# Patient Record
Sex: Male | Born: 1955
Health system: Southern US, Community
[De-identification: ages and names within clinical notes are randomized; demographics above are authoritative.]

## PROBLEM LIST (undated history)

## (undated) DIAGNOSIS — R7989 Other specified abnormal findings of blood chemistry: Secondary | ICD-10-CM

## (undated) DIAGNOSIS — J449 Chronic obstructive pulmonary disease, unspecified: Secondary | ICD-10-CM

## (undated) DIAGNOSIS — H269 Unspecified cataract: Secondary | ICD-10-CM

## (undated) DIAGNOSIS — K648 Other hemorrhoids: Secondary | ICD-10-CM

## (undated) DIAGNOSIS — G4730 Sleep apnea, unspecified: Secondary | ICD-10-CM

## (undated) DIAGNOSIS — F17201 Nicotine dependence, unspecified, in remission: Secondary | ICD-10-CM

## (undated) DIAGNOSIS — R945 Abnormal results of liver function studies: Secondary | ICD-10-CM

## (undated) DIAGNOSIS — K08109 Complete loss of teeth, unspecified cause, unspecified class: Secondary | ICD-10-CM

## (undated) DIAGNOSIS — I2510 Atherosclerotic heart disease of native coronary artery without angina pectoris: Secondary | ICD-10-CM

## (undated) DIAGNOSIS — K219 Gastro-esophageal reflux disease without esophagitis: Secondary | ICD-10-CM

## (undated) DIAGNOSIS — J189 Pneumonia, unspecified organism: Secondary | ICD-10-CM

## (undated) DIAGNOSIS — N189 Chronic kidney disease, unspecified: Secondary | ICD-10-CM

## (undated) DIAGNOSIS — I10 Essential (primary) hypertension: Secondary | ICD-10-CM

## (undated) DIAGNOSIS — F419 Anxiety disorder, unspecified: Secondary | ICD-10-CM

## (undated) DIAGNOSIS — T7840XA Allergy, unspecified, initial encounter: Secondary | ICD-10-CM

## (undated) DIAGNOSIS — K760 Fatty (change of) liver, not elsewhere classified: Secondary | ICD-10-CM

## (undated) DIAGNOSIS — G629 Polyneuropathy, unspecified: Secondary | ICD-10-CM

## (undated) DIAGNOSIS — E785 Hyperlipidemia, unspecified: Secondary | ICD-10-CM

## (undated) DIAGNOSIS — Z972 Presence of dental prosthetic device (complete) (partial): Secondary | ICD-10-CM

## (undated) DIAGNOSIS — G709 Myoneural disorder, unspecified: Secondary | ICD-10-CM

## (undated) DIAGNOSIS — N529 Male erectile dysfunction, unspecified: Secondary | ICD-10-CM

## (undated) DIAGNOSIS — D649 Anemia, unspecified: Secondary | ICD-10-CM

## (undated) DIAGNOSIS — I219 Acute myocardial infarction, unspecified: Secondary | ICD-10-CM

## (undated) DIAGNOSIS — D126 Benign neoplasm of colon, unspecified: Secondary | ICD-10-CM

## (undated) HISTORY — DX: Pneumonia, unspecified organism: J18.9

## (undated) HISTORY — DX: Chronic kidney disease, unspecified: N18.9

## (undated) HISTORY — PX: COLONOSCOPY: SHX174

## (undated) HISTORY — DX: Nicotine dependence, unspecified, in remission: F17.201

## (undated) HISTORY — DX: Essential (primary) hypertension: I10

## (undated) HISTORY — DX: Atherosclerotic heart disease of native coronary artery without angina pectoris: I25.10

## (undated) HISTORY — DX: Chronic obstructive pulmonary disease, unspecified: J44.9

## (undated) HISTORY — PX: POLYPECTOMY: SHX149

## (undated) HISTORY — DX: Male erectile dysfunction, unspecified: N52.9

## (undated) HISTORY — DX: Anemia, unspecified: D64.9

## (undated) HISTORY — DX: Abnormal results of liver function studies: R94.5

## (undated) HISTORY — DX: Sleep apnea, unspecified: G47.30

## (undated) HISTORY — DX: Hyperlipidemia, unspecified: E78.5

## (undated) HISTORY — DX: Gastro-esophageal reflux disease without esophagitis: K21.9

## (undated) HISTORY — DX: Anxiety disorder, unspecified: F41.9

## (undated) HISTORY — DX: Fatty (change of) liver, not elsewhere classified: K76.0

## (undated) HISTORY — DX: Acute myocardial infarction, unspecified: I21.9

## (undated) HISTORY — PX: UPPER GASTROINTESTINAL ENDOSCOPY: SHX188

## (undated) HISTORY — DX: Benign neoplasm of colon, unspecified: D12.6

## (undated) HISTORY — DX: Other hemorrhoids: K64.8

## (undated) HISTORY — DX: Myoneural disorder, unspecified: G70.9

## (undated) HISTORY — DX: Unspecified cataract: H26.9

## (undated) HISTORY — DX: Other specified abnormal findings of blood chemistry: R79.89

## (undated) HISTORY — PX: EYE SURGERY: SHX253

## (undated) HISTORY — DX: Polyneuropathy, unspecified: G62.9

---

## 1978-10-09 HISTORY — PX: APPENDECTOMY: SHX54

## 2000-12-19 ENCOUNTER — Ambulatory Visit (HOSPITAL_COMMUNITY): Admission: RE | Admit: 2000-12-19 | Discharge: 2000-12-19 | Payer: Self-pay | Admitting: Infectious Diseases

## 2000-12-19 ENCOUNTER — Encounter: Payer: Self-pay | Admitting: Infectious Diseases

## 2001-10-09 DIAGNOSIS — I2510 Atherosclerotic heart disease of native coronary artery without angina pectoris: Secondary | ICD-10-CM

## 2001-10-09 HISTORY — DX: Atherosclerotic heart disease of native coronary artery without angina pectoris: I25.10

## 2001-12-13 ENCOUNTER — Encounter: Payer: Self-pay | Admitting: Cardiology

## 2001-12-13 ENCOUNTER — Inpatient Hospital Stay (HOSPITAL_COMMUNITY): Admission: EM | Admit: 2001-12-13 | Discharge: 2001-12-15 | Payer: Self-pay

## 2002-05-26 ENCOUNTER — Emergency Department (HOSPITAL_COMMUNITY): Admission: EM | Admit: 2002-05-26 | Discharge: 2002-05-26 | Payer: Self-pay | Admitting: Emergency Medicine

## 2002-10-13 ENCOUNTER — Ambulatory Visit (HOSPITAL_COMMUNITY): Admission: RE | Admit: 2002-10-13 | Discharge: 2002-10-13 | Payer: Self-pay | Admitting: Internal Medicine

## 2003-05-11 ENCOUNTER — Emergency Department (HOSPITAL_COMMUNITY): Admission: EM | Admit: 2003-05-11 | Discharge: 2003-05-11 | Payer: Self-pay | Admitting: *Deleted

## 2003-05-11 ENCOUNTER — Inpatient Hospital Stay (HOSPITAL_COMMUNITY): Admission: EM | Admit: 2003-05-11 | Discharge: 2003-05-13 | Payer: Self-pay | Admitting: Cardiology

## 2003-05-11 ENCOUNTER — Encounter: Payer: Self-pay | Admitting: *Deleted

## 2003-05-12 ENCOUNTER — Encounter: Payer: Self-pay | Admitting: Cardiology

## 2004-08-19 ENCOUNTER — Ambulatory Visit (HOSPITAL_COMMUNITY): Admission: RE | Admit: 2004-08-19 | Discharge: 2004-08-19 | Payer: Self-pay | Admitting: Urology

## 2004-10-09 DIAGNOSIS — J189 Pneumonia, unspecified organism: Secondary | ICD-10-CM

## 2004-10-09 HISTORY — DX: Pneumonia, unspecified organism: J18.9

## 2005-02-16 ENCOUNTER — Encounter: Payer: Self-pay | Admitting: Cardiology

## 2005-02-16 ENCOUNTER — Inpatient Hospital Stay (HOSPITAL_COMMUNITY): Admission: EM | Admit: 2005-02-16 | Discharge: 2005-02-18 | Payer: Self-pay | Admitting: Emergency Medicine

## 2005-02-16 ENCOUNTER — Ambulatory Visit: Payer: Self-pay | Admitting: Cardiology

## 2005-04-14 ENCOUNTER — Ambulatory Visit: Payer: Self-pay | Admitting: *Deleted

## 2005-06-27 ENCOUNTER — Inpatient Hospital Stay (HOSPITAL_COMMUNITY): Admission: EM | Admit: 2005-06-27 | Discharge: 2005-06-29 | Payer: Self-pay | Admitting: Emergency Medicine

## 2005-06-27 ENCOUNTER — Ambulatory Visit: Payer: Self-pay | Admitting: Cardiovascular Disease

## 2005-06-27 ENCOUNTER — Ambulatory Visit: Payer: Self-pay | Admitting: Internal Medicine

## 2005-06-28 ENCOUNTER — Encounter: Payer: Self-pay | Admitting: Cardiology

## 2005-06-28 ENCOUNTER — Ambulatory Visit: Payer: Self-pay | Admitting: Cardiology

## 2005-06-28 ENCOUNTER — Encounter (INDEPENDENT_AMBULATORY_CARE_PROVIDER_SITE_OTHER): Payer: Self-pay | Admitting: *Deleted

## 2005-09-08 ENCOUNTER — Ambulatory Visit: Payer: Self-pay | Admitting: Cardiology

## 2005-09-11 ENCOUNTER — Ambulatory Visit (HOSPITAL_COMMUNITY): Admission: RE | Admit: 2005-09-11 | Discharge: 2005-09-11 | Payer: Self-pay | Admitting: Cardiology

## 2006-02-12 ENCOUNTER — Ambulatory Visit: Payer: Self-pay | Admitting: *Deleted

## 2006-02-27 ENCOUNTER — Ambulatory Visit: Payer: Self-pay | Admitting: Cardiology

## 2007-01-29 ENCOUNTER — Ambulatory Visit: Payer: Self-pay | Admitting: Cardiology

## 2007-06-06 ENCOUNTER — Ambulatory Visit: Payer: Self-pay | Admitting: Internal Medicine

## 2008-01-07 ENCOUNTER — Ambulatory Visit: Payer: Self-pay | Admitting: Cardiology

## 2008-01-19 ENCOUNTER — Ambulatory Visit: Payer: Self-pay | Admitting: Internal Medicine

## 2008-01-20 ENCOUNTER — Encounter: Payer: Self-pay | Admitting: Cardiology

## 2008-01-20 ENCOUNTER — Inpatient Hospital Stay (HOSPITAL_COMMUNITY): Admission: EM | Admit: 2008-01-20 | Discharge: 2008-01-21 | Payer: Self-pay | Admitting: Emergency Medicine

## 2008-01-21 ENCOUNTER — Ambulatory Visit: Payer: Self-pay

## 2008-02-17 ENCOUNTER — Ambulatory Visit: Payer: Self-pay | Admitting: Cardiology

## 2009-03-08 ENCOUNTER — Ambulatory Visit: Payer: Self-pay | Admitting: *Deleted

## 2009-03-09 ENCOUNTER — Encounter: Payer: Self-pay | Admitting: Cardiology

## 2009-03-09 ENCOUNTER — Inpatient Hospital Stay (HOSPITAL_COMMUNITY): Admission: EM | Admit: 2009-03-09 | Discharge: 2009-03-10 | Payer: Self-pay | Admitting: Emergency Medicine

## 2009-03-09 LAB — CONVERTED CEMR LAB
MCV: 82 fL
Platelets: 205 10*3/uL
WBC: 10.4 10*3/uL

## 2009-03-10 ENCOUNTER — Encounter: Payer: Self-pay | Admitting: Cardiology

## 2009-03-10 ENCOUNTER — Encounter: Payer: Self-pay | Admitting: Nurse Practitioner

## 2009-03-29 ENCOUNTER — Encounter: Payer: Self-pay | Admitting: Physician Assistant

## 2009-03-29 ENCOUNTER — Ambulatory Visit: Payer: Self-pay | Admitting: Cardiology

## 2009-04-29 ENCOUNTER — Ambulatory Visit (HOSPITAL_COMMUNITY): Admission: RE | Admit: 2009-04-29 | Discharge: 2009-04-29 | Payer: Self-pay | Admitting: Family Medicine

## 2009-04-29 ENCOUNTER — Encounter (INDEPENDENT_AMBULATORY_CARE_PROVIDER_SITE_OTHER): Payer: Self-pay | Admitting: *Deleted

## 2009-04-29 LAB — CONVERTED CEMR LAB
Albumin: 4.3 g/dL
Alkaline Phosphatase: 189 units/L
CO2: 25 meq/L
Calcium: 9.5 mg/dL
Chloride: 95 meq/L
Creatinine, Ser: 1.44 mg/dL
GFR calc non Af Amer: 51 mL/min
Potassium: 3.8 meq/L
Sodium: 136 meq/L

## 2009-05-14 ENCOUNTER — Encounter: Payer: Self-pay | Admitting: Cardiology

## 2009-05-18 ENCOUNTER — Telehealth (INDEPENDENT_AMBULATORY_CARE_PROVIDER_SITE_OTHER): Payer: Self-pay | Admitting: *Deleted

## 2009-10-06 ENCOUNTER — Encounter (INDEPENDENT_AMBULATORY_CARE_PROVIDER_SITE_OTHER): Payer: Self-pay | Admitting: *Deleted

## 2009-10-06 ENCOUNTER — Ambulatory Visit: Payer: Self-pay | Admitting: Cardiology

## 2009-10-06 DIAGNOSIS — F528 Other sexual dysfunction not due to a substance or known physiological condition: Secondary | ICD-10-CM | POA: Insufficient documentation

## 2009-10-06 DIAGNOSIS — Z862 Personal history of diseases of the blood and blood-forming organs and certain disorders involving the immune mechanism: Secondary | ICD-10-CM | POA: Insufficient documentation

## 2009-10-06 DIAGNOSIS — Z8639 Personal history of other endocrine, nutritional and metabolic disease: Secondary | ICD-10-CM

## 2009-10-20 ENCOUNTER — Encounter (INDEPENDENT_AMBULATORY_CARE_PROVIDER_SITE_OTHER): Payer: Self-pay

## 2009-10-20 LAB — CONVERTED CEMR LAB
Albumin: 4.3 g/dL
Bilirubin, Direct: 0.14 mg/dL
CO2: 27 meq/L
Glomerular Filtration Rate, Af Am: >59 mL/min/{1.73_m2}
Potassium: 3.9 meq/L
Sodium: 140 meq/L

## 2009-11-11 ENCOUNTER — Ambulatory Visit (HOSPITAL_COMMUNITY): Admission: RE | Admit: 2009-11-11 | Discharge: 2009-11-11 | Payer: Self-pay | Admitting: Ophthalmology

## 2010-02-04 ENCOUNTER — Encounter (INDEPENDENT_AMBULATORY_CARE_PROVIDER_SITE_OTHER): Payer: Self-pay | Admitting: *Deleted

## 2010-02-04 ENCOUNTER — Encounter: Payer: Self-pay | Admitting: Cardiology

## 2010-02-17 ENCOUNTER — Encounter: Payer: Self-pay | Admitting: Adult Health

## 2010-02-17 ENCOUNTER — Ambulatory Visit: Payer: Self-pay | Admitting: Cardiology

## 2010-02-24 ENCOUNTER — Ambulatory Visit: Payer: Self-pay | Admitting: Cardiology

## 2010-02-24 ENCOUNTER — Encounter (HOSPITAL_COMMUNITY): Admission: RE | Admit: 2010-02-24 | Discharge: 2010-02-24 | Payer: Self-pay | Admitting: Cardiology

## 2010-03-02 ENCOUNTER — Encounter (INDEPENDENT_AMBULATORY_CARE_PROVIDER_SITE_OTHER): Payer: Self-pay | Admitting: *Deleted

## 2010-03-03 ENCOUNTER — Encounter (INDEPENDENT_AMBULATORY_CARE_PROVIDER_SITE_OTHER): Payer: Self-pay | Admitting: *Deleted

## 2010-03-03 ENCOUNTER — Ambulatory Visit: Payer: Self-pay | Admitting: Cardiology

## 2010-03-03 LAB — CONVERTED CEMR LAB
BUN: 13 mg/dL
Basophils Absolute: 0 10*3/uL
Basophils Relative: 0 %
Bilirubin, Direct: 9.9 mg/dL
CO2: 29 meq/L
Calcium: 9.9 mg/dL
Creatinine, Ser: 1.15 mg/dL
Eosinophils Absolute: 0.3 10*3/uL
Eosinophils Relative: 4 %
Glucose, Bld: 372 mg/dL
HCT: 40.7 %
Hemoglobin: 14 g/dL
INR: 0.96
Monocytes Absolute: 0.4 10*3/uL
Monocytes Absolute: 0.4 10*3/uL
Monocytes Relative: 5 %
Platelets: 201 10*3/uL
Potassium: 3.9 meq/L
Prothrombin Time: 12.7 s
Prothrombin Time: 12.7 s
RDW: 13.6 %
RDW: 13.6 %
Sodium: 134 meq/L
WBC: 8.3 10*3/uL
WBC: 8.3 10*3/uL
aPTT: 28 s

## 2010-03-04 ENCOUNTER — Inpatient Hospital Stay (HOSPITAL_BASED_OUTPATIENT_CLINIC_OR_DEPARTMENT_OTHER): Admission: RE | Admit: 2010-03-04 | Discharge: 2010-03-04 | Payer: Self-pay | Admitting: Internal Medicine

## 2010-03-04 ENCOUNTER — Ambulatory Visit: Payer: Self-pay | Admitting: Internal Medicine

## 2010-03-10 ENCOUNTER — Encounter: Payer: Self-pay | Admitting: Cardiology

## 2010-03-15 ENCOUNTER — Ambulatory Visit: Payer: Self-pay | Admitting: Cardiology

## 2010-03-15 ENCOUNTER — Encounter: Payer: Self-pay | Admitting: Adult Health

## 2010-04-05 ENCOUNTER — Encounter (INDEPENDENT_AMBULATORY_CARE_PROVIDER_SITE_OTHER): Payer: Self-pay | Admitting: *Deleted

## 2010-04-05 ENCOUNTER — Ambulatory Visit: Payer: Self-pay | Admitting: Cardiology

## 2010-04-18 ENCOUNTER — Ambulatory Visit: Payer: Self-pay | Admitting: Cardiology

## 2010-04-18 ENCOUNTER — Encounter (INDEPENDENT_AMBULATORY_CARE_PROVIDER_SITE_OTHER): Payer: Self-pay | Admitting: *Deleted

## 2010-04-22 ENCOUNTER — Telehealth (INDEPENDENT_AMBULATORY_CARE_PROVIDER_SITE_OTHER): Payer: Self-pay | Admitting: *Deleted

## 2010-04-27 ENCOUNTER — Ambulatory Visit: Payer: Self-pay | Admitting: Cardiothoracic Surgery

## 2010-04-27 ENCOUNTER — Encounter: Payer: Self-pay | Admitting: Cardiology

## 2010-04-28 ENCOUNTER — Ambulatory Visit: Payer: Self-pay | Admitting: Vascular Surgery

## 2010-04-28 ENCOUNTER — Encounter: Payer: Self-pay | Admitting: Cardiothoracic Surgery

## 2010-04-28 ENCOUNTER — Ambulatory Visit: Payer: Self-pay | Admitting: Cardiology

## 2010-05-09 ENCOUNTER — Inpatient Hospital Stay (HOSPITAL_COMMUNITY): Admission: RE | Admit: 2010-05-09 | Discharge: 2010-05-15 | Payer: Self-pay | Admitting: Cardiothoracic Surgery

## 2010-05-09 ENCOUNTER — Ambulatory Visit: Payer: Self-pay | Admitting: Cardiothoracic Surgery

## 2010-05-09 HISTORY — PX: CORONARY ARTERY BYPASS GRAFT: SHX141

## 2010-05-26 ENCOUNTER — Ambulatory Visit: Payer: Self-pay | Admitting: Cardiology

## 2010-05-26 ENCOUNTER — Encounter (INDEPENDENT_AMBULATORY_CARE_PROVIDER_SITE_OTHER): Payer: Self-pay | Admitting: *Deleted

## 2010-06-01 ENCOUNTER — Encounter: Admission: RE | Admit: 2010-06-01 | Discharge: 2010-06-01 | Payer: Self-pay | Admitting: Cardiothoracic Surgery

## 2010-06-01 ENCOUNTER — Ambulatory Visit: Payer: Self-pay | Admitting: Cardiothoracic Surgery

## 2010-06-01 ENCOUNTER — Encounter: Payer: Self-pay | Admitting: Cardiology

## 2010-06-15 ENCOUNTER — Encounter (HOSPITAL_COMMUNITY)
Admission: RE | Admit: 2010-06-15 | Discharge: 2010-07-15 | Payer: Self-pay | Source: Home / Self Care | Admitting: Cardiology

## 2010-06-20 ENCOUNTER — Telehealth (INDEPENDENT_AMBULATORY_CARE_PROVIDER_SITE_OTHER): Payer: Self-pay | Admitting: *Deleted

## 2010-06-21 ENCOUNTER — Ambulatory Visit: Payer: Self-pay | Admitting: Cardiothoracic Surgery

## 2010-06-22 ENCOUNTER — Encounter (INDEPENDENT_AMBULATORY_CARE_PROVIDER_SITE_OTHER): Payer: Self-pay | Admitting: *Deleted

## 2010-06-29 ENCOUNTER — Ambulatory Visit: Payer: Self-pay | Admitting: Cardiothoracic Surgery

## 2010-07-24 IMAGING — NM NM MYOCAR MULTI W/SPECT W/WALL MOTION & EF
2 series · 12 of 12 positions shown · non-contrast
Comparison: none

Addendum Begins

Ordering Physician: Keyli Tiger
Jinlan Physician: [REDACTED]al Data: 46-year-old gentleman with coronary artery disease;
prior drug eluting stents to the RCA  and circumflex; now presents
with recurrent chest discomfort.
NUCLEAR MEDICINE STRESS MYOVIEW STUDY WITH SPECT AND LEFT
VENTRICULAR EJECTION FRACTION
Radionuclide Data: One-day rest/stress protocol performed with
[DATE] mCi of Mc-VVm Myoview.
Stress Data: Treadmill exercise performed to a workload of nine
mets and a heart rate of 126, 76% of age predicted maximum.
Exercise discontinued due to dyspnea and fatigue; no chest
discomfort reported.  Blood pressure increased from a resting value
of 140/75 to 170/80 during exercise and 180/70 early in recovery, a
normal response.  No significant arrhythmias noted; occasional PVC
and PVCs present during stress.
EKG: Normal sinus rhythm; borderline left atrial abnormality;
delayed R-wave progression - cannot exclude previous anteroseptal
myocardial infarction; minimal nonspecific ST-segment abnormality;
leftward axis; borderline first-degree AV block.
Stress EKG:  No significant change.
Scintigraphic Data: Acquisition notable for mild diaphragmatic
attenuation.  Left ventricular size was normal.  On tomographic
images reconstructed in standard planes, there was a small moderate
to severe defect at the base of the inferolateral wall.  By
comparison to the resting portion of the study, near complete
reversibility was apparent.  The gated reconstruction demonstrated
normal regional and global LV systolic function as well as normal
systolic accentuation of activity throughout.  Estimated ejection
fraction was 63%.

[Series 1: cr cardiac tc low dose · 6.41mm/px · 6 of 64 frames shown]
[frame 6/64]
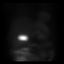
[frame 16/64]
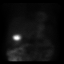
[frame 27/64]
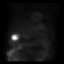
[frame 38/64]
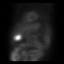
[frame 48/64]
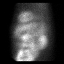
[frame 59/64]
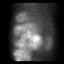

[Series 1: cs cardiac tc hi dose · 6.41mm/px · 6 of 512 frames shown]
[frame 43/512]
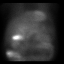
[frame 128/512]
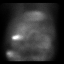
[frame 214/512]
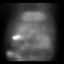
[frame 299/512]
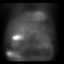
[frame 384/512]
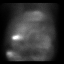
[frame 470/512]
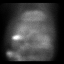

[12 of 12 positions shown; findings below may reference images not displayed]

IMPRESSION: Abnormal stress nuclear myocardial study revealing somewhat
impaired exercise capacity, normal left ventricular size, normal
left ventricular systolic function and no significant stress-
induced EKG abnormalities.  By scintigraphic imaging, there was
profound inferolateral ischemia without evidence for infarction.
Other findings as noted.

Addendum Ends
This examination was dictated by the cardiologist who supervised
the examination.  Please see that report for details.

## 2010-08-25 ENCOUNTER — Encounter (INDEPENDENT_AMBULATORY_CARE_PROVIDER_SITE_OTHER): Payer: Self-pay | Admitting: *Deleted

## 2010-08-26 ENCOUNTER — Ambulatory Visit: Payer: Self-pay | Admitting: Cardiology

## 2010-08-26 ENCOUNTER — Encounter (INDEPENDENT_AMBULATORY_CARE_PROVIDER_SITE_OTHER): Payer: Self-pay | Admitting: *Deleted

## 2010-10-09 DIAGNOSIS — D126 Benign neoplasm of colon, unspecified: Secondary | ICD-10-CM

## 2010-10-09 HISTORY — PX: COLONOSCOPY W/ POLYPECTOMY: SHX1380

## 2010-10-09 HISTORY — DX: Benign neoplasm of colon, unspecified: D12.6

## 2010-10-29 IMAGING — CR DG CHEST 2V
2 series · 2 of 2 positions shown · non-contrast
Comparison: 05/13/2010

CLINICAL DATA: Prior CABG.

CHEST - 2 VIEW

[view not recorded (1 of 2)]
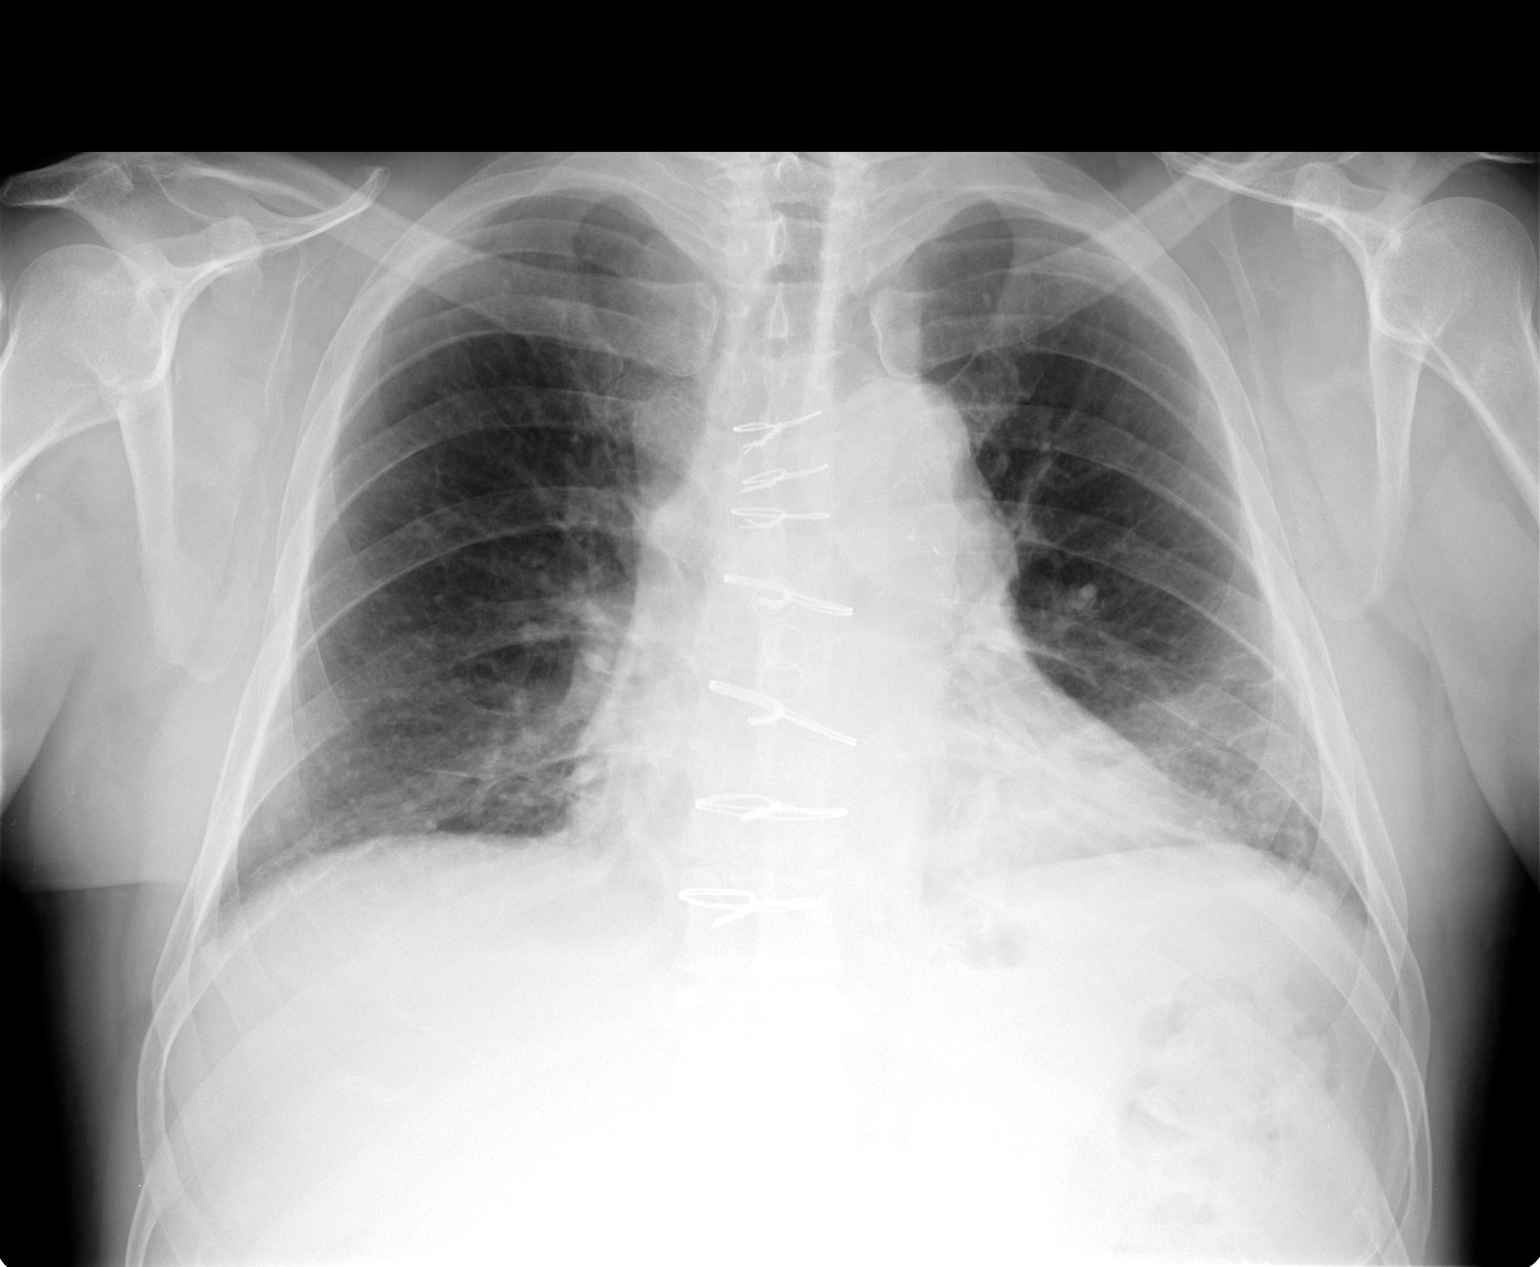

[view not recorded (2 of 2)]
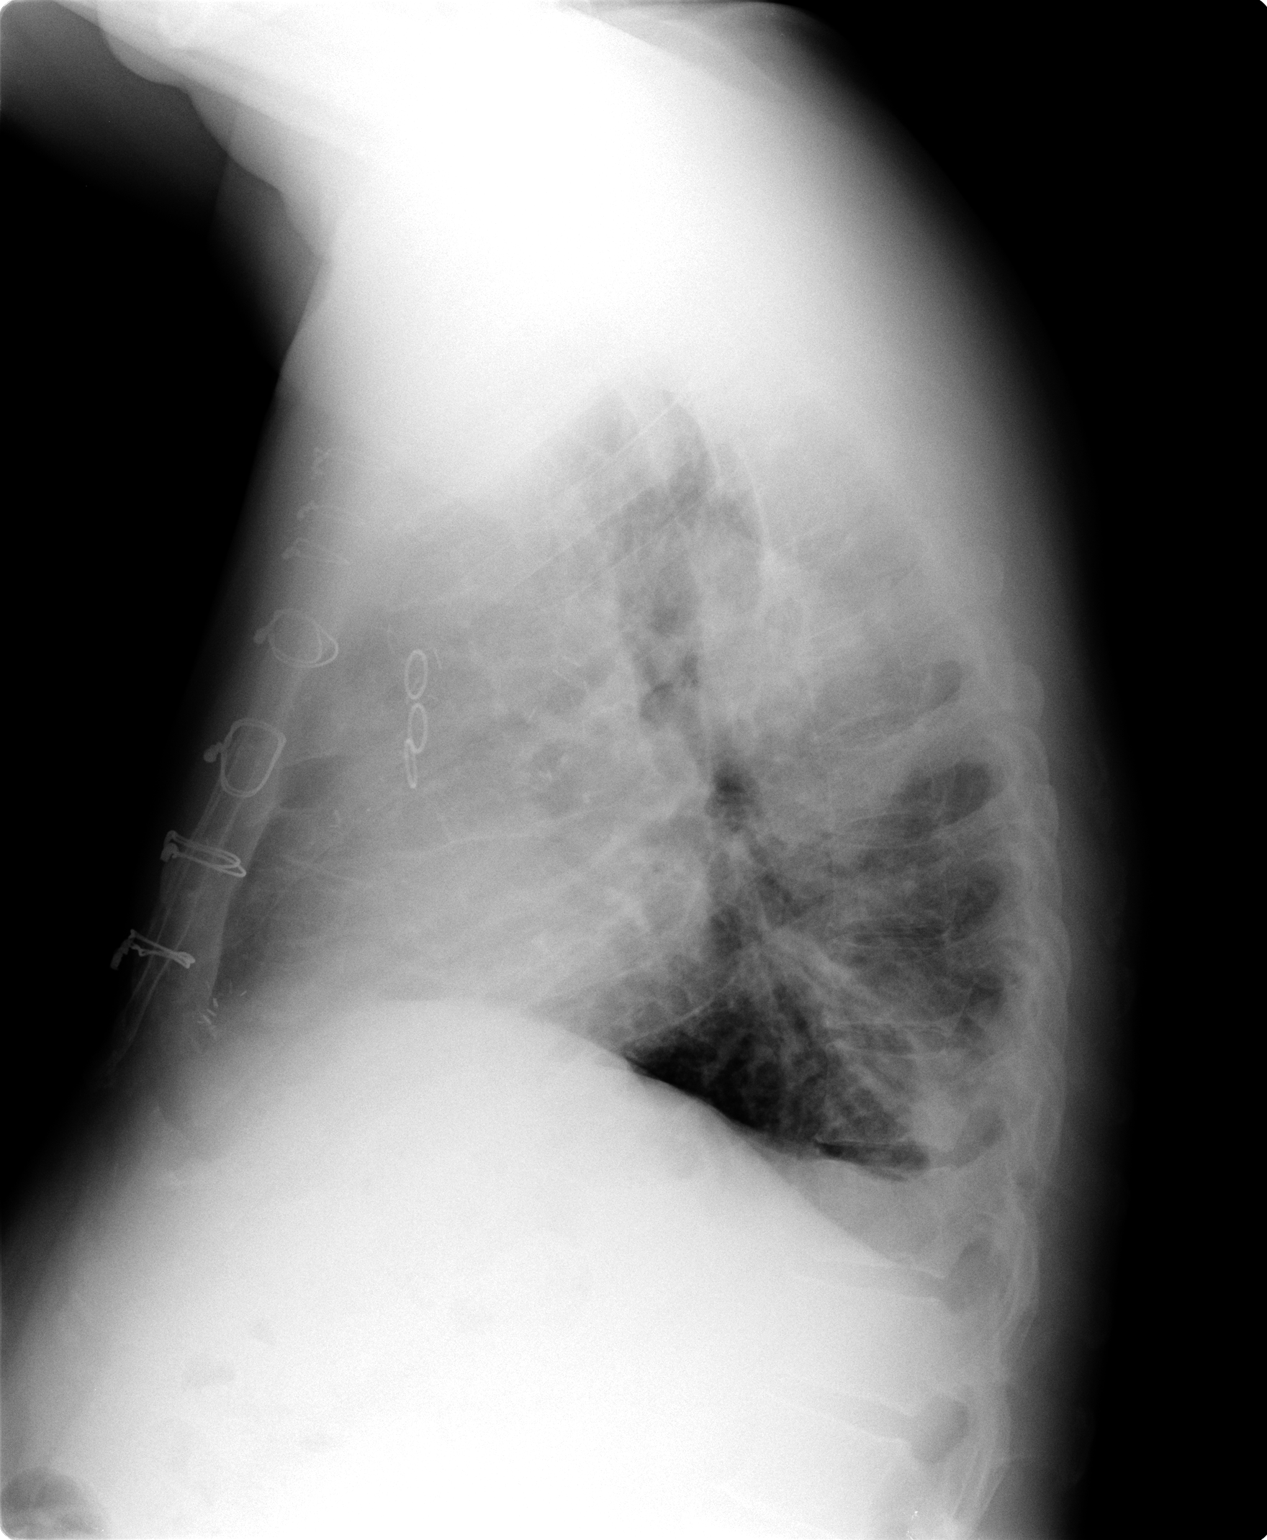

[2 of 2 positions shown; findings below may reference images not displayed]

FINDINGS: Prior CABG changes.  There are low lung volumes with
bibasilar atelectasis, improved since prior study.  Heart is
borderline in size.  No effusions.
IMPRESSION: Low lung volumes with bibasilar atelectasis, slightly improved.

## 2010-11-06 LAB — CONVERTED CEMR LAB
BUN: 13 mg/dL
Basophils Absolute: 0 K/uL
Basophils Relative: 0 %
CO2: 29 meq/L
Calcium: 9.9 mg/dL
Chloride: 95 meq/L — ABNORMAL LOW
Cholesterol: 140 mg/dL (ref 0–200)
Creatinine, Ser: 1.15 mg/dL
Eosinophils Absolute: 0.3 K/uL
Eosinophils Relative: 4 %
Glucose, Bld: 372 mg/dL — ABNORMAL HIGH
HCT: 40.7 %
HDL: 30 mg/dL — ABNORMAL LOW (ref >39)
Hemoglobin: 14 g/dL
INR: 0.96
Lymphocytes Relative: 18 %
Lymphs Abs: 1.5 K/uL
MCHC: 34.3 g/dL
MCV: 82.5 fL
Monocytes Absolute: 0.4 K/uL
Monocytes Relative: 5 %
Neutro Abs: 6.1 K/uL
Neutrophils Relative %: 73 %
Platelets: 201 K/uL
Potassium: 3.9 meq/L
Prothrombin Time: 12.7 s
RBC: 4.94 M/uL
RDW: 13.6 %
Sodium: 134 meq/L — ABNORMAL LOW
Triglycerides: 313 mg/dL — ABNORMAL HIGH (ref <150)
VLDL: 63 mg/dL — ABNORMAL HIGH (ref 0–40)
WBC: 8.3 10*3/microliter
aPTT: 28 s

## 2010-11-08 NOTE — Miscellaneous (Signed)
Summary: ntg refill  Clinical Lists Changes  Medications: Added new medication of NITROSTAT 0.4 MG SUBL (NITROGLYCERIN) 1 tablet under tongue at onset of chest pain; you may repeat every 5 minutes for up to 3 doses. - Signed Rx of NITROSTAT 0.4 MG SUBL (NITROGLYCERIN) 1 tablet under tongue at onset of chest pain; you may repeat every 5 minutes for up to 3 doses.;  #25 x 3;  Signed;  Entered by: Teressa Lower RN;  Authorized by: Kathlen Brunswick, MD, [facility];  Method used: Electronically to [facility] [address] [place], [place], [place]  [address], Ph: [phone], Fax: [phone]    Prescriptions: NITROSTAT 0.4 MG SUBL (NITROGLYCERIN) 1 tablet under tongue at onset of chest pain; you may repeat every 5 minutes for up to 3 doses.  #25 x 3   Entered by:   Teressa Lower RN   Authorized by:   Kathlen Brunswick, MD, [facility]   Signed by:   Teressa Lower RN on 02/04/2010   Method used:   Electronically to        [facility] (retail)       [address]       [place], [place]  [address]       Ph: [phone]       Fax: [phone]   RxID:   [id number]

## 2010-11-08 NOTE — Miscellaneous (Signed)
Summary: CBCD,BMP,PT,03/03/2010  Clinical Lists Changes  Observations: Added new observation of BILI DIRECT: 9.9 mg/dL (16/07/9603 54:09) Added new observation of CREATININE: 1.15 mg/dL (81/19/1478 29:56) Added new observation of BUN: 13 mg/dL (21/30/8657 84:69) Added new observation of BG RANDOM: 372 mg/dL (62/95/2841 32:44) Added new observation of CO2 PLSM/SER: 29 meq/L (03/03/2010 11:30) Added new observation of CL SERUM: 95 meq/L (03/03/2010 11:30) Added new observation of K SERUM: 3.9 meq/L (03/03/2010 11:30) Added new observation of NA: 134 meq/L (03/03/2010 11:30) Added new observation of PT PATIENT: 12.7 s (03/03/2010 11:30) Added new observation of PTT PATIENT: 28 s (03/03/2010 11:30) Added new observation of ABSOLUTE BAS: 0.0 K/uL (03/03/2010 11:30) Added new observation of BASOPHIL %: 0 % (03/03/2010 11:30) Added new observation of EOS ABSLT: 0.3 K/uL (03/03/2010 11:30) Added new observation of % EOS AUTO: 4 % (03/03/2010 11:30) Added new observation of ABSOLUTE MON: 0.4 K/uL (03/03/2010 11:30) Added new observation of MONOCYTE %: 5 % (03/03/2010 11:30) Added new observation of ABS LYMPHOCY: 1.5 K/uL (03/03/2010 11:30) Added new observation of LYMPHS %: 18 % (03/03/2010 11:30) Added new observation of PLATELETK/UL: 201 K/uL (03/03/2010 11:30) Added new observation of RDW: 13.6 % (03/03/2010 11:30) Added new observation of MCHC RBC: 34.3 g/dL (10/11/7251 66:44) Added new observation of MCV: 82.5 fL (03/03/2010 11:30) Added new observation of HCT: 40.7 % (03/03/2010 11:30) Added new observation of HGB: 14.0 g/dL (03/47/4259 56:38) Added new observation of RBC M/UL: 4.94 M/uL (03/03/2010 11:30) Added new observation of WBC COUNT: 8.3 10*3/microliter (03/03/2010 11:30)

## 2010-11-08 NOTE — Assessment & Plan Note (Signed)
Summary: 75month/rm   Visit Type:  Follow-up Referring Kaiea Esselman:  . Primary Chrisma Hurlock:  [facility]   History of Present Illness: [name] returns to the office for continuing assessment and treatment of coronary disease, now 3 months following CABG surgery.  Except for a minor superficial incisional infection, his surgical and post surgical course have been uncomplicated.  He has worked with some limitation of lifting and has participated in cardiac rehabilitation without any cardiopulmonary symptoms.  Lipid profile was last assessed 7 months ago at which time it was good except for hypertriglyceridemia.  Blood pressure control has been good.  He continues to refrain from cigarette smoking.  Current Medications (verified): 1)  Metformin Hcl 1000 Mg Tabs (Metformin Hcl) .... Take 1 Tablet By Mouth Two Times A Day 2)  Metoprolol Tartrate 25 Mg Tabs (Metoprolol Tartrate) .... Take 1 Tab Two Times A Day 3)  Aspirin 325 Mg Tabs (Aspirin) 4)  Viagra 100 Mg Tabs (Sildenafil Citrate) .... 1/2 Tab As Needed 5)  Pravastatin Sodium 40 Mg Tabs (Pravastatin Sodium) .... Take 1 Tablet By Mouth Once A Day 6)  Lantus 100 Unit/ml Soln (Insulin Glargine) .... 50 Units Daily 7)  Daily Multi  Tabs (Multiple Vitamins-Minerals) .... Take 1 Tab Daily 8)  Ranitidine Hcl 150 Mg Caps (Ranitidine Hcl) .... Take 1 Tab Daily 9)  Alprazolam 0.5 Mg Tabs (Alprazolam) .... Take As Needed 10)  Diovan Hct 320-12.5 Mg Tabs (Valsartan-Hydrochlorothiazide) .... Take 1 Tablet By Mouth Once A Day  Allergies: No Known Drug Allergies  Comments:  Nurse/Medical Assistant: patient brought med list and reviewed med list from previous ov and the only change is not on nitro or cephalexin any longer and insuli changed to 50 units daily  [facility] changed insulin  Past History:  PMH, FH, and Social History reviewed and updated.  Past Medical History: ASCVD-non-Q  myocardial infarction and stent to RCA in 12/2001 with normal ejection fraction; stents placed in mid circumflex and first diagonal;  cath in 02/2005-progressive ramus intermedius disease; 50-70% mid LAD and 90% distal; 50% D1; patent RCA stent; 60-70% circumflex lesions; PDA small with diffuse disease; 01/2008-no significant progression; negative stress nuclear study; symptoms; Non-Q. MI in 02/2010-total obstruction of the mid circumflex at site of previous stenting; RCA nondominant but with patent previous stent and right to left collaterals; CX-dominant; mid 50% stenosis with subsequent total occlusion; 50% RI branch; normal EF; allergy to Ranexa; CABG surgery-04/2010 _______________________________________________________ ERECTILE DYSFUNCTION, NON-ORGANIC (ICD-302.72) DIABETES MELLITUS, TYPE II (ICD-250.00)-no insulin; patient recalls A1c of 6 in 2010 HYPERLIPIDEMIA (ICD-272.4)-triglycerides HYPERTENSION (ICD-401.9) Chronic obstructive pulmonary disease Chronic kidney disease-creatinine 1.44 in 04/2009 Tobacco abuse-30 pack years; discontinued 12/2001; resumed at .5 PPD; D/C 05/2010 Abnormal hepatic profile Pneumonia-2006    Review of Systems       See history of present illness.  Vital Signs:  Patient profile:   55 year old male Weight:      187 pounds O2 Sat:      96 % on Room air Pulse rate:   93 / minute BP sitting:   125 / 82  (right arm)  Vitals Entered By: Dreama Saa, CNA (August 26, 2010 11:35 AM)  O2 Flow:  Room air  Physical Exam  General:  Moderately overweight; well developed; no acute distress:   Neck-No JVD; no carotid bruits: Lungs-No tachypnea, no rales; no rhonchi; no wheezes Thorax-surgical incision is completely healed; sternum is stable Cardiovascular-normal PMI; normal S1 and S2;  Grade 1-2/6 basilar systolic ejection murmur Abdomen-BS normal; soft and non-tender without masses or organomegaly:  Musculoskeletal-No deformities, no cyanosis or  clubbing: Neurologic-Normal cranial nerves; symmetric strength and tone:  Skin-Warm, no significant lesions Extremities-Nl distal pulses; trace edema:     Impression & Recommendations:  Problem # 1:  ATHEROSCLEROTIC CV DISEASE-CABG (ICD-429.2) Patient has been cleared by his cardiovascular surgeon to perform any activity required in his work.  I suggested he limit lifting to 50 pounds for the next month or 2.  He'll continue to remain active and try to lose some weight.  A chemistry profile and lipid profile will be drawn.  Problem # 2:  TOBACCO ABUSE-QUIT (ICD-305.1) He is congratulated on continuing to refrain from cigarette smoking and encouraged to persist in his efforts to do so.  Problem # 3:  ERECTILE DYSFUNCTION, NON-ORGANIC (ICD-302.72) Permission given to resume use of Viagra.  Problem # 4:  HYPERTENSION (ICD-401.9) Blood pressure control is excellent with current medications, which will be continued.  I will reassess this nice gentleman in one year.  Other Orders: Future Orders: T-Lipid Profile ([id number]) ... 08/29/2010 T-Comprehensive Metabolic Panel [phone]) ... 08/29/2010  Patient Instructions: 1)  Your physician recommends that you schedule a follow-up appointment in: 1 YEAR 2)  Your physician recommends that you return for lab work in: NEXT WEEK Prescriptions: DIOVAN HCT 320-12.5 MG TABS (VALSARTAN-HYDROCHLOROTHIAZIDE) Take 1 tablet by mouth once a day  #90 x 3   Entered by:   Teressa Lower RN   Authorized by:   Kathlen Brunswick, MD, [facility]   Signed by:   Teressa Lower RN on 08/26/2010   Method used:   Faxed to ...       [facility] (mail-order)             , [place]         Ph: [phone]       Fax: [phone]   RxID:   [id number] PRAVASTATIN SODIUM 40 MG TABS (PRAVASTATIN SODIUM) Take 1 tablet by mouth once a day  #90 x 3   Entered by:   Teressa Lower RN   Authorized by:   Kathlen Brunswick, MD, [facility]   Signed by:   Teressa Lower RN on 08/26/2010    Method used:   Faxed to ...       [facility] (mail-order)             , [place]         Ph: [phone]       Fax: [phone]   RxID:   [id number] METOPROLOL TARTRATE 25 MG TABS (METOPROLOL TARTRATE) take 1 tab two times a day  #180 x 3   Entered by:   Teressa Lower RN   Authorized by:   Kathlen Brunswick, MD, [facility]   Signed by:   Teressa Lower RN on 08/26/2010   Method used:   Faxed to ...       [facility] (mail-order)             , [place]         Ph: [phone]       Fax: [phone]   RxID:   [id number]

## 2010-11-08 NOTE — Miscellaneous (Signed)
Summary: CMET and Lipid's   Clinical Lists Changes  Observations: Added new observation of CALCIUM: 10.2 mg/dL (30/86/5784 69:62) Added new observation of ALBUMIN: 4.3 g/dL (95/28/4132 44:01) Added new observation of PROTEIN, TOT: 6.4 g/dL (02/72/5366 44:03) Added new observation of SGPT (ALT): 33 units/L (10/20/2009 15:01) Added new observation of SGOT (AST): 21 units/L (10/20/2009 15:01) Added new observation of ALK PHOS: 168 units/L (10/20/2009 15:01) Added new observation of BILI DIRECT: 0.14 mg/dL (47/42/5956 38:75) Added new observation of GFR AA: >59 mL/min/1.87m2 (10/20/2009 15:01) Added new observation of GFR: 59 mL/min (10/20/2009 15:01) Added new observation of CREATININE: 1.26 mg/dL (64/33/2951 88:41) Added new observation of BUN: 20 mg/dL (66/03/3015 01:09) Added new observation of BG RANDOM: 222 mg/dL (32/35/5732 20:25) Added new observation of CO2 PLSM/SER: 27 meq/L (10/20/2009 15:01) Added new observation of CL SERUM: 100 meq/L (10/20/2009 15:01) Added new observation of K SERUM: 3.9 meq/L (10/20/2009 15:01) Added new observation of NA: 140 meq/L (10/20/2009 15:01)

## 2010-11-08 NOTE — Progress Notes (Signed)
Summary: [facility]  [facility]   Imported By: Marylou Mccoy 05/18/2010 11:28:32  _____________________________________________________________________  External Attachment:    Type:   Image     Comment:   External Document

## 2010-11-08 NOTE — Miscellaneous (Signed)
Summary: LABS CBCD,BMP,PT,PTT,03/03/2010  Clinical Lists Changes  Observations: Added new observation of ABSOLUTE BAS: 0.0 K/uL (03/03/2010 9:30) Added new observation of BASOPHIL %: 0 % (03/03/2010 9:30) Added new observation of EOS ABSLT: 4 K/uL (03/03/2010 9:30) Added new observation of ABSOLUTE MON: 0.4 K/uL (03/03/2010 9:30) Added new observation of MONOCYTE %: 5 % (03/03/2010 9:30) Added new observation of ABS LYMPHOCY: 1.5 K/uL (03/03/2010 9:30) Added new observation of LYMPHS %: 18 % (03/03/2010 9:30) Added new observation of RDW: 13.6 % (03/03/2010 9:30) Added new observation of CALCIUM: 9.9 mg/dL (88/41/6606 3:01) Added new observation of CREATININE: 1.15 mg/dL (60/07/9322 5:57) Added new observation of BUN: 13 mg/dL (32/20/2542 7:06) Added new observation of BG RANDOM: 372 mg/dL (23/76/2831 5:17) Added new observation of CO2 PLSM/SER: 29 meq/L (03/03/2010 9:30) Added new observation of CL SERUM: 95 meq/L (03/03/2010 9:30) Added new observation of K SERUM: 3.9 meq/L (03/03/2010 9:30) Added new observation of NA: 134 meq/L (03/03/2010 9:30) Added new observation of PLATELETK/UL: 201 K/uL (03/03/2010 9:30) Added new observation of MCV: 82.5 fL (03/03/2010 9:30) Added new observation of HCT: 40.7 % (03/03/2010 9:30) Added new observation of HGB: 14.0 g/dL (61/60/7371 0:62) Added new observation of WBC COUNT: 8.3 10*3/microliter (03/03/2010 9:30) Added new observation of INR: 0.96  (03/03/2010 9:30) Added new observation of PT PATIENT: 12.7 s (03/03/2010 9:30) Added new observation of PTT PATIENT: 28 s (03/03/2010 9:30)

## 2010-11-08 NOTE — Assessment & Plan Note (Signed)
Summary: PT HAVING PROBLEMS W/SOB/HAD TO TAKE NTG OVER THE WEEKEND/TG   Visit Type:  Follow-up Primary Provider:  [facility]  CC:  some cp and sob.  History of Present Illness: [name] is a pleasant 55 y/o CM with known history of CAD with most recent cardiac cath in 03/2009 with PCI to the posterior lateral CX (DES) and PCI to restenotic stents of the midportion of the CX using 2 overlaping DES.  He has history of stents to diag branch of LAD prox and mid; DM, hypercholesterolemia and hypertension.  He presents today with complaints of recurrent pain requiring use of Ntg.  over a 2-3 month period.  His usual angina pain is jaw and arm pain and tightness. Now he complains of shoulder pain that awakened him during the night relieved with NTG. He also states he is having pressure in his chest with walking and increased dyspnea and fatigue.  He also describes pain in his legs with walking that goes away with  rest.  This is becoming concerning to him.  He denies dizziness, diaphoresis but has noticed a significant change in his energy level.  He admits to family stress with sister in hospital, but he states the symptoms began before this.  Current Medications (verified): 1)  Amlodipine Besylate 10 Mg Tabs (Amlodipine Besylate) .Marland Kitchen.. 1 Tab Once Daily 2)  Metformin Hcl 1000 Mg Tabs (Metformin Hcl) .... Take 1/2 Tab Two Times A Day 3)  Metoprolol Succinate 50 Mg Xr24h-Tab (Metoprolol Succinate) .... Take 1 and 1/2 Tabs Once Daily 4)  Aspirin 325 Mg Tabs (Aspirin) 5)  Diovan Hct 320-25 Mg Tabs (Valsartan-Hydrochlorothiazide) .Marland Kitchen.. 1 Tab Once Daily 6)  Viagra 100 Mg Tabs (Sildenafil Citrate) .... 1/2 Tab As Needed 7)  Protonix 40 Mg Tbec (Pantoprazole Sodium) .... Take 1 Tab Once Daily 8)  Plavix 75 Mg Tabs (Clopidogrel Bisulfate) .... Take One Tablet By Mouth Daily 9)  Simvastatin 20 Mg Tabs (Simvastatin) .... Take 1 Tab Daily 10)  Clonidine Hcl 0.1 Mg Tabs (Clonidine Hcl) ....  Take One Tablet By Mouth Twice A Day 11)  Nitrostat 0.4 Mg Subl (Nitroglycerin) .Marland Kitchen.. 1 Tablet Under Tongue At Onset of Chest Pain; You May Repeat Every 5 Minutes For Up To 3 Doses.  Allergies (verified): No Known Drug Allergies  Past History:  Past medical, surgical, family and social histories (including risk factors) reviewed, and no changes noted (except as noted below).  Past Medical History: Reviewed history from 10/06/2009 and no changes required. ASCVD-non-Q myocardial infarction and stent to RCA in 12/2001 with normal ejection fraction; stents placed in mid circumflex and first diagonal;  cath in 02/2005-progressive ramus intermedius disease; 50-70% mid LAD and 90% distal; 50% D1; patent RCA stent; 60-70% circumflex lesions; PDA small with diffuse disease; 01/2008-no significant progression; negative stress nuclear study _______________________________________________________ ERECTILE DYSFUNCTION, NON-ORGANIC (ICD-302.72) DIABETES MELLITUS, TYPE II (ICD-250.00)-no insulin; patient recalls A1c of 6 in 2010 HYPERLIPIDEMIA (ICD-272.4)-triglycerides HYPERTENSION (ICD-401.9) Chronic obstructive pulmonary disease Chronic kidney disease-creatinine 1.44 in 04/2009 Tobacco abuse-30 pack years; discontinued 12/2001; resumed at one half pack per day Abnormal hepatic profile Pneumonia-2006    Past Surgical History: Reviewed history from 03/26/2009 and no changes required. cath appendectomy angioplasty stents times 2  Family History: Reviewed history from 03/26/2009 and no changes required. Father:deceased age 57 with chf Mother:78 and healthy  Social History: Reviewed history from 03/26/2009 and no changes required. Full Time Divorced  Tobacco Use - Yes.  Alcohol Use - no Regular Exercise -  yes Drug Use - no 4 brothers 1 deceased due to pancreatic cancer 1 sister alive and well 1 daughter  Review of Systems       Chest pressure, shoulder pain awakening him at night with NTG  relief, decreased energy.  All other systems have been reviewed and are negative unless stated above.   Vital Signs:  Patient profile:   55 year old male Weight:      190 pounds Pulse rate:   96 / minute BP sitting:   139 / 86  (right arm)  Vitals Entered By: Dreama Saa, CNA (Feb 17, 2010 3:08 PM)  Physical Exam  General:  Well developed, well nourished, in no acute distress. Neck:  Neck supple, no JVD. No masses, thyromegaly or abnormal cervical nodes. Lungs:  Clear bilaterally to auscultation and percussion. Heart:  Non-displaced PMI, chest non-tender; regular rate and rhythm, S1, S2 without murmurs, rubs or gallops. Carotid upstroke normal, no bruit. Normal abdominal aortic size, no bruits. Femorals normal pulses, no bruits. Pedals normal pulses. No edema, no varicosities. Abdomen:  Bowel sounds positive; abdomen soft and non-tender without masses, organomegaly, or hernias noted. No hepatosplenomegaly. Msk:  Back normal, normal gait. Muscle strength and tone normal. Pulses:  pulses normal in all 4 extremities no burits auscultated in femerol bilaterally, Extremities:  No clubbing or cyanosis. Neurologic:  Alert and oriented x 3. Psych:  Normal affect.   Impression & Recommendations:  Problem # 1:  ATHEROSCLEROTIC CARDIOVASCULAR DISEASE (ICD-429.2) I am concerned about his symptoms with known CAD.  I have discussed this with Dr. Dietrich Pates and he has recommended a stress myoview. If he is found to have positive study we will proceed to cardiac catherization.    Problem # 2:  DIABETES MELLITUS, TYPE II-NO INSULIN (ICD-250.00) Pt states  his blood glucose is worsening.  He and primary are discussing need to place on insulin. His updated medication list for this problem includes:    Metformin Hcl 1000 Mg Tabs (Metformin hcl) .Marland Kitchen... Take 1/2 tab two times a day    Aspirin 325 Mg Tabs (Aspirin)    Diovan Hct 320-25 Mg Tabs (Valsartan-hydrochlorothiazide) .Marland Kitchen... 1 tab once  daily  Other Orders: Nuclear Stress Test (Nuc Stress Test)  Patient Instructions: 1)  Your physician recommends that you schedule a follow-up appointment in: Post Stress test 2)  Your physician recommends that you continue on your current medications as directed. Please refer to the Current Medication list given to you today. 3)  Your physician has requested that you have an exercise stress myoview.  For further information please visit [email or web address]  Please follow instruction sheet, as given.

## 2010-11-08 NOTE — Progress Notes (Signed)
Summary: [facility] Office Visit   [facility] Office Visit   Imported By: Roderic Ovens 08/02/2010 14:14:51  _____________________________________________________________________  External Attachment:    Type:   Image     Comment:   External Document

## 2010-11-08 NOTE — Miscellaneous (Signed)
Summary: DIOVAN/HCT REFILL PER dR. Morton Peters  Clinical Lists Changes  Medications: Rx of DIOVAN HCT 320-12.5 MG TABS (VALSARTAN-HYDROCHLOROTHIAZIDE) Take 1 tablet by mouth once a day;  #30 x 0;  Signed;  Entered by: Teressa Lower RN;  Authorized by: Kathlen Brunswick, MD, [facility];  Method used: Electronically to [facility] [address], [place], [place], [place]  [address], Ph: [phone], Fax: [phone] Rx of DIOVAN HCT 320-12.5 MG TABS (VALSARTAN-HYDROCHLOROTHIAZIDE) Take 1 tablet by mouth once a day;  #90 x 1;  Signed;  Entered by: Teressa Lower RN;  Authorized by: Kathlen Brunswick, MD, [facility];  Method used: Faxed to [facility], , , [place]  , Ph: [phone], Fax: [phone]    Prescriptions: DIOVAN HCT 320-12.5 MG TABS (VALSARTAN-HYDROCHLOROTHIAZIDE) Take 1 tablet by mouth once a day  #90 x 1   Entered by:   Teressa Lower RN   Authorized by:   Kathlen Brunswick, MD, [facility]   Signed by:   Teressa Lower RN on 06/22/2010   Method used:   Faxed to ...       [facility] [place] (mail-order)             , [place]         Ph: [phone]       Fax: [phone]   RxID:   [id number] DIOVAN HCT 320-12.5 MG TABS (VALSARTAN-HYDROCHLOROTHIAZIDE) Take 1 tablet by mouth once a day  #30 x 0   Entered by:   Teressa Lower RN   Authorized by:   Kathlen Brunswick, MD, [facility]   Signed by:   Teressa Lower RN on 06/22/2010   Method used:   Electronically to        [facility] (retail)       [address]       [place], [place]  [address]       Ph: [phone]       Fax: [phone]   RxID:   [id number]

## 2010-11-08 NOTE — Progress Notes (Signed)
Summary: SITE INFECTED  Phone Note Call from Patient Call back at [facility] Phone [phone]   Caller: PT Reason for Call: Talk to Nurse Summary of Call: PT INCISION IN INFECTED BASEBALL SIZE RED SPOT THAT IS WARM TO TOUCH. Initial call taken by: Faythe Ghee,  June 20, 2010 4:32 PM    New/Updated Medications: DIOVAN HCT 320-12.5 MG TABS (VALSARTAN-HYDROCHLOROTHIAZIDE) Take 1 tablet by mouth once a day Prescriptions: DIOVAN HCT 320-12.5 MG TABS (VALSARTAN-HYDROCHLOROTHIAZIDE) Take 1 tablet by mouth once a day  #90 x 3   Entered by:   Teressa Lower RN   Authorized by:   Kathlen Brunswick, MD, [facility]   Signed by:   Teressa Lower RN on 06/20/2010   Method used:   Electronically to        [facility] (retail)       [address]       [place], [place]  [address]       Ph: [phone]       Fax: [phone]   RxID:   [id number]

## 2010-11-08 NOTE — Assessment & Plan Note (Signed)
Summary: pre cath work up per Dr.Rothbart/tg   Visit Type:  Follow-up Referring Provider:  rothbart Primary Provider:  [facility]  CC:  chest pain.  History of Present Illness: History of Present Illness: [name] is a pleasant 55 y/o CM with known history of CAD with most recent cardiac cath in 03/2009 with PCI to the posterior lateral CX (DES) and PCI to restenotic stents of the midportion of the CX using 2 overlaping DES.  He has history of stents to diag branch of LAD prox and mid; DM, hypercholesterolemia and hypertension.  He presented to office on 02/17/2010 with complaints of recurrent pain requiring use of Ntg.  over a 2-3 month period.  His usual angina pain is jaw and arm pain and tightness. Now he complains of shoulder pain that awakened him during the night relieved with NTG. He also states he is having pressure in his chest with walking and increased dyspnea and fatigue.  He also describes pain in his legs with walking that goes away with  rest.  This is becoming concerning to him.  He denies dizziness, diaphoresis but has noticed a significant change in his energy level.  He admits to family stress with sister in hospital, but he states the symptoms began before this.  A nuclear stress test was ordered with results revealing abnormal with profound infereolateral ischemia without evidence for infarction.  LVEF 65%.  As a result of this he is here to discuss repeat cardiac cath.  Problems Prior to Update: 1)  Unspecified Pre-operative Examination  (ICD-V72.84) 2)  Myocardial Perfusion Scan, With Stress Test, Abnormal  (ICD-794.39) 3)  Other Chest Pain  (ICD-786.59) 4)  Diabetes Mellitus, Type Ii-no Insulin  (ICD-250.00) 5)  Hypertriglyceridemia  (ICD-272.1) 6)  Hypertension  (ICD-401.9) 7)  Atherosclerotic Cardiovascular Disease  (ICD-429.2) 8)  Erectile Dysfunction, Non-organic  (ICD-302.72) 9)  Tobacco Abuse  (ICD-305.1) 10)  Liver Function Tests,  Abnormal, Hx of  (ICD-V12.2)  Medications Prior to Update: 1)  Amlodipine Besylate 10 Mg Tabs (Amlodipine Besylate) .Marland Kitchen.. 1 Tab Once Daily 2)  Metformin Hcl 1000 Mg Tabs (Metformin Hcl) .... Take 1/2 Tab Two Times A Day 3)  Metoprolol Succinate 50 Mg Xr24h-Tab (Metoprolol Succinate) .... Take 1 and 1/2 Tabs Once Daily 4)  Aspirin 325 Mg Tabs (Aspirin) 5)  Diovan Hct 320-25 Mg Tabs (Valsartan-Hydrochlorothiazide) .Marland Kitchen.. 1 Tab Once Daily 6)  Viagra 100 Mg Tabs (Sildenafil Citrate) .... 1/2 Tab As Needed 7)  Protonix 40 Mg Tbec (Pantoprazole Sodium) .... Take 1 Tab Once Daily 8)  Plavix 75 Mg Tabs (Clopidogrel Bisulfate) .... Take One Tablet By Mouth Daily 9)  Simvastatin 20 Mg Tabs (Simvastatin) .... Take 1 Tab Daily 10)  Clonidine Hcl 0.1 Mg Tabs (Clonidine Hcl) .... Take One Tablet By Mouth Twice A Day 11)  Nitrostat 0.4 Mg Subl (Nitroglycerin) .Marland Kitchen.. 1 Tablet Under Tongue At Onset of Chest Pain; You May Repeat Every 5 Minutes For Up To 3 Doses.  Current Medications (verified): 1)  Amlodipine Besylate 10 Mg Tabs (Amlodipine Besylate) .Marland Kitchen.. 1 Tab Once Daily 2)  Metformin Hcl 1000 Mg Tabs (Metformin Hcl) .... Take 1/2 Tab Two Times A Day 3)  Metoprolol Succinate 50 Mg Xr24h-Tab (Metoprolol Succinate) .... Take 1 and 1/2 Tabs Once Daily 4)  Aspirin 325 Mg Tabs (Aspirin) 5)  Diovan Hct 320-25 Mg Tabs (Valsartan-Hydrochlorothiazide) .Marland Kitchen.. 1 Tab Once Daily 6)  Viagra 100 Mg Tabs (Sildenafil Citrate) .... 1/2 Tab As Needed 7)  Protonix 40 Mg  Tbec (Pantoprazole Sodium) .... Take 1 Tab Once Daily 8)  Plavix 75 Mg Tabs (Clopidogrel Bisulfate) .... Take One Tablet By Mouth Daily 9)  Simvastatin 20 Mg Tabs (Simvastatin) .... Take 1 Tab Daily 10)  Clonidine Hcl 0.1 Mg Tabs (Clonidine Hcl) .... Take One Tablet By Mouth Twice A Day 11)  Nitrostat 0.4 Mg Subl (Nitroglycerin) .Marland Kitchen.. 1 Tablet Under Tongue At Onset of Chest Pain; You May Repeat Every 5 Minutes For Up To 3 Doses.  Allergies (verified): No Known Drug  Allergies PMH-FH-SH reviewed-no changes except otherwise noted  Review of Systems        All other systems have been reviewed and are negative unless stated above.   Vital Signs:  Patient profile:   55 year old male Weight:      187 pounds BMI:     31.23 Pulse rate:   75 / minute BP sitting:   133 / 81  (right arm)  Vitals Entered By: Dreama Saa, CNA (Mar 03, 2010 11:34 AM)  Nutrition Counseling: Patient's BMI is greater than 25 and therefore counseled on weight management options.  Physical Exam  General:  Well developed, well nourished, in no acute distress. Head:  normocephalic and atraumatic Eyes:  Glasses Ears:  TM's intact and clear with normal canals and hearing Nose:  no deformity, discharge, inflammation, or lesions Mouth:  Teeth, gums and palate normal. Oral mucosa normal. Lungs:  Clear bilaterally to auscultation and percussion. Heart:  Non-displaced PMI, chest non-tender; regular rate and rhythm, S1, S2 without murmurs, rubs or gallops. Carotid upstroke normal, no bruit. Normal abdominal aortic size, no bruits. Femorals normal pulses, no bruits. Pedals normal pulses. No edema, no varicosities. Abdomen:  Bowel sounds positive; abdomen soft and non-tender without masses, organomegaly, or hernias noted. No hepatosplenomegaly. Msk:  Back normal, normal gait. Muscle strength and tone normal. Extremities:  No clubbing or cyanosis. Neurologic:  Alert and oriented x 3. Psych:  Normal affect.   EKG  Procedure date:  03/03/2010  Findings:      LAE Normal sinus rhythm with rbpmate of:  86  Impression & Recommendations:  Problem # 1:  ATHEROSCLEROTIC CARDIOVASCULAR DISEASE (ICD-429.2) Patient has had continued symptoms of angina with abnormal nuclear perfusion test revealing profound inferolateral ischemia without evidence of infarction.  Cardiac cath is planned for friday, May 27th.  Risks benefits and discussion of the test results are completed with the patient.   He is anxious but willing to proceed.  Problem # 2:  DIABETES MELLITUS, TYPE II-NO INSULIN (ICD-250.00) Assessment: Unchanged  His updated medication list for this problem includes:    Metformin Hcl 1000 Mg Tabs (Metformin hcl) .Marland Kitchen... Take 1/2 tab two times a day    Aspirin 325 Mg Tabs (Aspirin)    Diovan Hct 320-25 Mg Tabs (Valsartan-hydrochlorothiazide) .Marland Kitchen... 1 tab once daily  Problem # 3:  HYPERTENSION (ICD-401.9) Assessment: Unchanged  His updated medication list for this problem includes:    Amlodipine Besylate 10 Mg Tabs (Amlodipine besylate) .Marland Kitchen... 1 tab once daily    Metoprolol Succinate 50 Mg Xr24h-tab (Metoprolol succinate) .Marland Kitchen... Take 1 and 1/2 tabs once daily    Aspirin 325 Mg Tabs (Aspirin)    Diovan Hct 320-25 Mg Tabs (Valsartan-hydrochlorothiazide) .Marland Kitchen... 1 tab once daily    Clonidine Hcl 0.1 Mg Tabs (Clonidine hcl) .Marland Kitchen... Take one tablet by mouth twice a day

## 2010-11-08 NOTE — Miscellaneous (Signed)
Summary: echo 04/28/2010,cxr 05/12/2010,05/13/2010  Clinical Lists Changes  Observations: Added new observation of CXR RESULTS:   Clinical Data: Post CABG.  History of coronary artery disease    CHEST - 2 VIEW    Comparison: 05/12/2010    Findings: Low lung volumes persist.  Taking this into   consideration, heart size is mildly enlarged .  There has been   interval improvement in bibasilar aeration but persistent density   is noted left greater than right.  Focal alveolar density is   identified in the posterior segment of the left lower lobe and   while this is likely postoperative in nature, an area of focal   pneumonia is not excluded.  Clinical correlation is recommended.    A left posterior extrapleural density likely represents posterior   fluid.  No signs of congestive failure are seen.    IMPRESSION:   Improving bibasilar opacities.  (05/13/2010 13:25) Added new observation of CXR RESULTS:   Clinical Data: CABG, follow-up    PORTABLE CHEST - 1 VIEW    Comparison: Portable chest x-ray of 05/11/2010    Findings: The lungs are slightly better aerated.  Bibasilar   opacities persist, left greater than right.  Cardiomegaly is   stable.  Median sternotomy sutures are noted.  The venous sheath   has been removed from the SVC and no pneumothorax is seen.    IMPRESSION:   Improved aeration.  Persistent bibasilar opacities left greater   than right.  No pneumothorax.    Read By:  Juline Patch,  M.D.   Released By:  Juline Patch,  M.D.  (05/12/2010 13:25) Added new observation of ECHOINTERP:  Study Conclusions    - Left ventricle: The cavity size was normal. Wall thickness was     increased in a pattern of mild LVH. There was mild focal basal     hypertrophy of the septum. Systolic function was normal. The     estimated ejection fraction was in the range of 55% to 60%. There     appears to be mild mid posterior hypokinesis. Features are     consistent with a pseudonormal  left ventricular filling pattern,     with concomitant abnormal relaxation and increased filling     pressure (grade 2 diastolic dysfunction).   - Aortic valve: There was no stenosis.   - Mitral valve: Mild regurgitation.   - Left atrium: The atrium was mildly dilated.   - Right ventricle: The cavity size was normal. Systolic function was     normal.   - Pulmonary arteries: No complete TR doppler jet was measured so     unable to estimate PA systolic pressure.   - Inferior vena cava: The vessel was normal in size; the     respirophasic diameter changes were in the normal range (= 50%);     findings are consistent with normal central venous pressure.   Impressions: (04/28/2010 13:24)      Echocardiogram  Procedure date:  04/28/2010  Findings:       Study Conclusions    - Left ventricle: The cavity size was normal. Wall thickness was     increased in a pattern of mild LVH. There was mild focal basal     hypertrophy of the septum. Systolic function was normal. The     estimated ejection fraction was in the range of 55% to 60%. There     appears to be mild mid posterior hypokinesis. Features are  consistent with a pseudonormal left ventricular filling pattern,     with concomitant abnormal relaxation and increased filling     pressure (grade 2 diastolic dysfunction).   - Aortic valve: There was no stenosis.   - Mitral valve: Mild regurgitation.   - Left atrium: The atrium was mildly dilated.   - Right ventricle: The cavity size was normal. Systolic function was     normal.   - Pulmonary arteries: No complete TR doppler jet was measured so     unable to estimate PA systolic pressure.   - Inferior vena cava: The vessel was normal in size; the     respirophasic diameter changes were in the normal range (= 50%);     findings are consistent with normal central venous pressure.   Impressions:  CXR  Procedure date:  05/13/2010  Findings:        Clinical Data: Post CABG.   History of coronary artery disease    CHEST - 2 VIEW    Comparison: 05/12/2010    Findings: Low lung volumes persist.  Taking this into   consideration, heart size is mildly enlarged .  There has been   interval improvement in bibasilar aeration but persistent density   is noted left greater than right.  Focal alveolar density is   identified in the posterior segment of the left lower lobe and   while this is likely postoperative in nature, an area of focal   pneumonia is not excluded.  Clinical correlation is recommended.    A left posterior extrapleural density likely represents posterior   fluid.  No signs of congestive failure are seen.    IMPRESSION:   Improving bibasilar opacities.   CXR  Procedure date:  05/12/2010  Findings:        Clinical Data: CABG, follow-up    PORTABLE CHEST - 1 VIEW    Comparison: Portable chest x-ray of 05/11/2010    Findings: The lungs are slightly better aerated.  Bibasilar   opacities persist, left greater than right.  Cardiomegaly is   stable.  Median sternotomy sutures are noted.  The venous sheath   has been removed from the SVC and no pneumothorax is seen.    IMPRESSION:   Improved aeration.  Persistent bibasilar opacities left greater   than right.  No pneumothorax.    Read By:  Juline Patch,  M.D.   Released By:  Juline Patch,  M.D.

## 2010-11-08 NOTE — Letter (Signed)
Summary: [name] (Nuc Med Stress)  [facility]  [address], [place] [address]   Phone: [phone]  Fax: [phone]    Nuclear Medicine 1-Day Stress Test Information Sheet  Re:     [name]   DOB:     [birth year] MRN:     [id number] Weight:  Appointment Date: Register at: Appointment Time: Referring MD:  _X__Exercise Stress  __Adenosine   __Dobutamine  __Lexiscan  __Persantine   __Thallium  Urgency: ____1 (next day)   ____2 (one week)    ____3 (PRN)  Patient will receive Follow Up call with results: Patient needs follow-up appointment:  Instructions regarding medication:  How to prepare for your stress test: 1. DO NOT eat or dring 6 hours prior to your arrival time. This includes no caffeine (coffee, tea, sodas, chocolate) if you were instructed to take your medications, drink water with it. 2. DO NOT use any tobacco products for at leaset 8 hours prior to arrival. 3. DO NOT wear dresses or any clothing that may have metal clasps or buttons. 4. Wear short sleeve shirts, loose clothing, and comfortalbe walking shoes. 5. DO NOT use lotions, oils or powder on your chest before the test. 6. The test will take approximately 3-4 hours from the time you arrive until completion. 7. To register the day of the test, go to the Short Stay entrance at [facility]. 8. If you must cancel your test, call [phone] as soon as you are aware. 9. Please do not take Metformin or Metoprolol the morning of test. After you arrive for test:   When you arrive at [facility], you will go to Short Stay to be registered. They will then send you to Radiology to check in. The Nuclear Medicine Tech will get you and start an IV in your arm or hand. A small amount of a radioactive tracer will then be injected into your IV. This tracer will then have to circulate for 30-45 minutes. During this time you will wait in the waiting room and you will be able to  drink something without caffeine. A series of pictures will be taken of your heart follwoing this waiting period. After the 1st set of pictures you will go to the stress lab to get ready for your stress test. During the stress test, another small amount of a radioactive tracer will be injected through your IV. When the stress test is complete, there is a short rest period while your heart rate and blood pressure will be monitored. When this monitoring period is complete you will have another set of pictrues taken. (The same as the 1st set of pictures). These pictures are taken between 15 minutes and 1 hour after the stress test. The time depends on the type of stress test you had. Your doctor will inform you of your test results within 7 days after test.    The possibilities of certain changes are possible during the test. They include abnormal blood pressure and disorders of the heart. Side effects of persantine or adenosine can include flushing, chest pain, shortness of breath, stomach tightness, headache and light-headedness. These side effects usually do not last long and are self-resolving. Every effort will be made to keep you comfortable and to minimize complications by obtaining a medical history and by close observation during the test. Emergency equipment, medications, and trained personnel are available to deal with any unusual situation which may arise.  Please notify office at  least 48 hours in advance if you are unable to keep this appt.

## 2010-11-08 NOTE — Miscellaneous (Signed)
Summary: Rehab Report  Rehab Report   Imported By: Faythe Ghee 05/26/2010 16:25:33  _____________________________________________________________________  External Attachment:    Type:   Image     Comment:   External Document

## 2010-11-08 NOTE — Letter (Signed)
Summary: [place] [facility], agricultural at [facility]  [address] [place] [address]   Phone: [phone]  Fax: [phone]      March 10, 2010 MRN: [id number]   DERYCK HIPPLER [address] [place], [place]  [address]   Dear Mr. PRETTY,  Your test ordered by Selena Batten has been reviewed by your physician (or physician assistant) and was found to be normal or stable. Your physician (or physician assistant) felt no changes were needed at this time.  ____ Echocardiogram  ____ Cardiac Stress Test  __X__ Lab Work  ____ Peripheral vascular study of arms, legs or neck  ____ CT scan or X-ray  ____ Lung or Breathing test  ____ Other: Please continue on current medical treatment.  Thank you.   [place] Bing, MD, F.A.C.C

## 2010-11-08 NOTE — Assessment & Plan Note (Signed)
Summary: post CABG per pt phone call/tg   Visit Type:  Follow-up Referring Provider:  . Primary Provider:  [facility]   History of Present Illness: [name] returns to the office following uncomplicated CABG surgery.  He notes some soreness over the left sternum, mild pleuritic chest discomfort, an area of erythema over his right leg and drainage of a modest amount of white material from his left chest tube stab wound.  Activity has gradually been increasing.  He notes no problems in mentation or new issues with sleep.  He has regained weight back to his baseline, but is consuming a healthier diet. He vows to continue to refrain from cigarette smoking.  Current Medications (verified): 1)  Metformin Hcl 1000 Mg Tabs (Metformin Hcl) .... Take 1 Tablet By Mouth Two Times A Day 2)  Metoprolol Tartrate 25 Mg Tabs (Metoprolol Tartrate) .... Take 1 Tab Two Times A Day 3)  Aspirin 325 Mg Tabs (Aspirin) 4)  Viagra 100 Mg Tabs (Sildenafil Citrate) .... 1/2 Tab As Needed 5)  Nitrostat 0.4 Mg Subl (Nitroglycerin) .Marland Kitchen.. 1 Tablet Under Tongue At Onset of Chest Pain; You May Repeat Every 5 Minutes For Up To 3 Doses. 6)  Pravastatin Sodium 40 Mg Tabs (Pravastatin Sodium) .... Take 1 Tablet By Mouth Once A Day 7)  Lantus 100 Unit/ml Soln (Insulin Glargine) .... 30 Units Two Times A Day 8)  Daily Multi  Tabs (Multiple Vitamins-Minerals) .... Take 1 Tab Daily 9)  Ranitidine Hcl 150 Mg Caps (Ranitidine Hcl) .... Take 1 Tab Daily 10)  Alprazolam 0.5 Mg Tabs (Alprazolam) .... Take As Needed 11)  Cephalexin 500 Mg Caps (Cephalexin) .... Take 1 Tablet By Mouth Three Times A Day  Allergies (verified): No Known Drug Allergies  Past History:  PMH, FH, and Social History reviewed and updated.  Past Medical History: ASCVD-non-Q myocardial infarction and stent to RCA in 12/2001 with normal ejection fraction; stents placed in mid circumflex and first diagonal;  cath in  02/2005-progressive ramus intermedius disease; 50-70% mid LAD and 90% distal; 50% D1; patent RCA stent; 60-70% circumflex lesions; PDA small with diffuse disease; 01/2008-no significant progression; negative stress nuclear study; symptoms; Non-Q. MI in 02/2010-total obstruction of the mid circumflex at site of previous stenting; RCA nondominant but with patent previous stent and right to left collaterals; CX-dominant; mid 50% stenosis with subsequent total occlusion; 50% RI branch; normal EF; allergy to Ranexa; CABG surgery-04/2010 _______________________________________________________ ERECTILE DYSFUNCTION, NON-ORGANIC (ICD-302.72) DIABETES MELLITUS, TYPE II (ICD-250.00)-no insulin; patient recalls A1c of 6 in 2010 HYPERLIPIDEMIA (ICD-272.4)-triglycerides HYPERTENSION (ICD-401.9) Chronic obstructive pulmonary disease Chronic kidney disease-creatinine 1.44 in 04/2009 Tobacco abuse-30 pack years; discontinued 12/2001; resumed at one half pack per day Abnormal hepatic profile Pneumonia-2006    Review of Systems       See history of present illness.  Vital Signs:  Patient profile:   55 year old male Weight:      183 pounds Pulse rate:   75 / minute BP sitting:   132 / 79  (right arm)  Vitals Entered By: Dreama Saa, CNA (May 26, 2010 2:58 PM)  Physical Exam  General:  Moderately overweight; well developed; no acute distress:   Neck-No JVD; no carotid bruits: Lungs-No tachypnea, no rales; no rhonchi; no wheezes; decreased breath sounds at the right base with dullness to percussion; left chest tube incision is not entirely closed; no surrounding fluctuance; no material expressed with palpation Cardiovascular-normal PMI; normal S1 and S2; modest basilar systolic ejection murmur  Abdomen-BS normal; soft and non-tender without masses or organomegaly:  Musculoskeletal-No deformities, no cyanosis or clubbing: Neurologic-Normal cranial nerves; symmetric strength and tone:  Skin-Warm,  erythematous nodule measuring 1 cm over the skin of the right leg Extremities-Nl distal pulses; no edema:     EKG  Procedure date:  05/26/2010  Findings:      Normal sinus rhythm Borderline first degree AV block Left atrial abnormality Borderline low voltage Delayed R-wave progression-possible lead placement error No previous tracings for comparison.   Impression & Recommendations:  Problem # 1:  ATHEROSCLEROTIC CV DISEASE-CABG (ICD-429.2) [name] is doing well following CABG surgery.  We will verify that he is to start cardiac rehabilitation.  He was congratulated on cessation of smoking and advised to continue-he appears committed to doing so.  He was encouraged regarding increased activity, but will refrain from any significant tasks with the upper extremities.  An EKG was recorded with a copy sent with the patient to his appointment with Dr. Donata Clay next week.  Since he has an apparent folliculitis over the lower extremities and a possible minimal wound infection over the chest tube insertion site, a one-week course of cephalosporin will be given.  Problem # 2:  HYPERTRIGLYCERIDEMIA (ICD-272.1) Lipid status will be reevaluated once he fully recovers from surgery.  Problem # 3:  HYPERTENSION (ICD-401.9) Blood pressure control is good; current medication will be continued.  I will plan to reassess [name] in 3 months.  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  Other Orders: Cardiac Rehabilitation (Cardiac Rehab)  Patient Instructions: 1)  Your physician recommends that you schedule a follow-up appointment in: 3 months 2)  Your physician has recommended you make the following change in your medication: keflex 500mg  three times a day x 7 days Prescriptions: CEPHALEXIN 500 MG CAPS (CEPHALEXIN) Take 1 tablet by mouth three times a day  #21 x 0   Entered by:   Teressa Lower RN   Authorized by:   Kathlen Brunswick, MD, [facility]   Signed by:   Teressa Lower RN on 05/26/2010   Method used:   Electronically to        [facility]* (retail)       [address]       [place], [place]  [address]       Ph: [phone]       Fax: [phone]   RxID:   [id number]

## 2010-11-08 NOTE — Letter (Signed)
Summary: [place] [employer], [employer] at [employer]  [address], [place] [address]   Phone: [phone]  Fax: [phone]     August 26, 2010 MRN: [id number]   [facility] [address] [place], [place]  [address]      YOUR LAB WORK IS DUE   August 29, 2010  Please go to [facility], located across the street from [facility] on the second floor.  Hours are Monday - Friday 7am until 7:30pm         Saturday 8am until 12noon    __  DO NOT EAT OR DRINK AFTER MIDNIGHT EVENING PRIOR TO LABWORK

## 2010-11-08 NOTE — Assessment & Plan Note (Signed)
Summary: 7-10 F/U PER CHECKUOT ON 04/05/10/TG   Visit Type:  Follow-up Referring Provider:  . Primary Provider:  [facility]   History of Present Illness: [name] returns to the office as scheduled for continued assessment and treatment of coronary artery disease.  Since his last visit, he has had essentially no angina and has not used nitroglycerin; however, he notes exercise intolerance with dyspnea and fatigue on mild effort.  He is unable to roll his trash container down his driveway or to complete a day of shopping.  His case was discussed with Dr. Charlies Constable, who feels that his total occlusion could be approached percutaneously, but the likelihood of a durable good outcome is less than 25%, while the chance for a significant complication is increased.  [name] is dissatisfied with his current quality of life.  His skin reaction has persisted, but has become more tolerable due to treatment with topical steroids and Atarax.  Symptoms on his trunk have faded as has the rash, but significant problems persist over the lower extremities.  Current Medications (verified): 1)  Amlodipine Besylate 10 Mg Tabs (Amlodipine Besylate) .Marland Kitchen.. 1 Tab Once Daily 2)  Metformin Hcl 1000 Mg Tabs (Metformin Hcl) .... Take 1 Tablet By Mouth Two Times A Day 3)  Metoprolol Succinate 50 Mg Xr24h-Tab (Metoprolol Succinate) .... Take 1 and 1/2 Tabs Once Daily 4)  Aspirin 325 Mg Tabs (Aspirin) 5)  Diovan Hct 320-25 Mg Tabs (Valsartan-Hydrochlorothiazide) .Marland Kitchen.. 1 Tab Once Daily 6)  Viagra 100 Mg Tabs (Sildenafil Citrate) .... 1/2 Tab As Needed 7)  Protonix 40 Mg Tbec (Pantoprazole Sodium) .... Take 1 Tab Once Daily 8)  Plavix 75 Mg Tabs (Clopidogrel Bisulfate) .... Take One Tablet By Mouth Daily 9)  Clonidine Hcl 0.1 Mg Tabs (Clonidine Hcl) .... Take One Tablet By Mouth Twice A Day 10)  Nitrostat 0.4 Mg Subl (Nitroglycerin) .Marland Kitchen.. 1 Tablet Under Tongue At Onset of Chest Pain; You May  Repeat Every 5 Minutes For Up To 3 Doses. 11)  Hydroxyzine Hcl 25 Mg Tabs (Hydroxyzine Hcl) .Marland Kitchen.. 1 Tablet At Bedtime and Two Times A Day As Needed 12)  Prednisone 20 Mg Tabs (Prednisone) .... Take 1 Tablet By Mouth Two Times A Day X 3 Days Then Once Daily 13)  Pravastatin Sodium 40 Mg Tabs (Pravastatin Sodium) .... Take 1 Tablet By Mouth Once A Day  Allergies (verified): No Known Drug Allergies  Past History:  PMH, FH, and Social History reviewed and updated.  Past Medical History: ASCVD-non-Q myocardial infarction and stent to RCA in 12/2001 with normal ejection fraction; stents placed in mid circumflex and first diagonal;  cath in 02/2005-progressive ramus intermedius disease; 50-70% mid LAD and 90% distal; 50% D1; patent RCA stent; 60-70% circumflex lesions; PDA small with diffuse disease; 01/2008-no significant progression; negative stress nuclear study; symptoms; Non-Q. MI in 02/2010-total obstruction of the mid circumflex at site of previous stenting; RCA nondominant but with patent previous stent and right to left collaterals; CX-dominant; mid 50% stenosis with subsequent total occlusion; 50% RI branch; normal EF _______________________________________________________ ERECTILE DYSFUNCTION, NON-ORGANIC (ICD-302.72) DIABETES MELLITUS, TYPE II (ICD-250.00)-no insulin; patient recalls A1c of 6 in 2010 HYPERLIPIDEMIA (ICD-272.4)-triglycerides HYPERTENSION (ICD-401.9) Chronic obstructive pulmonary disease Chronic kidney disease-creatinine 1.44 in 04/2009 Tobacco abuse-30 pack years; discontinued 12/2001; resumed at one half pack per day Abnormal hepatic profile Pneumonia-2006    Cardiac Cath  Procedure date:  03/05/2010  Findings:      Central aortic pressure 138/73 with a mean of  97.  LV:140/22   EDP of 24.      Nl LMCA.   LAD was a large vessel coursed to the apex. Throughout the LAD, there  was mild diffuse disease with a focal 30% lesion in the midsection and a high-grade 95%  stenosis at the apex.  In the first diagonal, there was a  previously placed stent, which was patent.   CX- large dominant vessel, gave off a small-to-   moderate ramus branch, a small OM branch and then was totally occluded  in the midsection within the previously placed stent.  There was a 50%  lesion in the mid AV groove circumflex right before the stent.  There  was also a 50% lesion in the upper portion of the ramus branch.      Right coronary artery was nondominant vessel and had a high anterior takeoff.  There was a stent placed in the proximal portion, which was  widely patent.  There was an RV branch, which was subtotally occluded. Collaterals to Cx noted.     EF of 55-60%.  No regional wall motion abnormalities.      ASSESSMENT:   1. Three-vessel coronary artery disease as described above.   2. Reocclusion of the mid left circumflex drug-eluting stent with       right-to-left collaterals.   3. Normal left ventricular function.      PLAN: Given that he has already two layers of drug-eluting stent,   which he is re-occluded, likelihood that he would be able to keep this area open is low.  Medical rx advised.   Other possibilities would   be possible single-vessel bypass surgery versus consideration of   brachytherapy at [facility].  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  Alcohol Use - no Regular Exercise - yes Drug Use - no 4 brothers 1 deceased due to pancreatic cancer 1 sister alive and well 1 daughter  Review of Systems       See history of present illness.  Vital Signs:  Patient profile:   55 year old male Weight:      184 pounds O2 Sat:      97 % on Room air Pulse rate:   78 / minute BP sitting:   132 / 80  (right arm)  Vitals Entered By: Dreama Saa, CNA (April 18, 2010 11:00 AM)  O2  Flow:  Room air  Physical Exam  General:  Well developed; no acute distress:   Neck-No JVD; no carotid bruits: Lungs-No tachypnea, no rales; no rhonchi; no wheezes: Cardiovascular-normal PMI; normal S1 and S2; minimal basilar systolic ejection murmur Abdomen-BS normal; soft and non-tender without masses or organomegaly:  Musculoskeletal-No deformities, no cyanosis or clubbing: Neurologic-Normal cranial nerves; symmetric strength and tone:  Skin-Warm, Salmon-colored predominantly macular rash over the abdomen with multiple papules over the thighs. Extremities-Nl distal pulses; no edema:     Impression & Recommendations:  Problem # 1:  ATHEROSCLEROTIC CARDIOVASCULAR DISEASE (ICD-429.2) Although angina has improved on Ranexa, patient continues to have exertional symptoms likely related to CAD.  Therapeutic options were discussed at length.  Ranexa must be discontinued, which connotes a failure of medical therapy.  Risk/benefit ratio of percutaneous intervention is relatively unfavorable.  Accordingly, I have recommended consultation with a cardiac surgeon as soon as possible.  Patient will call for any progression in  symptoms.  Problem # 2:  HYPERTENSION (ICD-401.9) Blood pressure control is good with current agents.  Problem # 3:  CUTANEOUS ERUPTIONS, LIKELY DRUG-INDUCED (ICD-693.0) Although symptoms have improved, dermal process really has not.  Ranexa will be discontinued.  If the eruption persists, referral to a dermatologist will be appropriate.  I will plan to see this gentleman in one month or following CABG surgery, if performed.  Other Orders: Future Orders: T-Comprehensive Metabolic Panel ([id number]) ... 06/20/2010 T-Lipid Profile [phone]) ... 06/20/2010  Patient Instructions: 1)  Your physician recommends that you schedule a follow-up appointment in: 2 months or post CABG if done 2)  Your physician recommends that you return for lab work in: 2 months 3)  Your  physician has recommended you make the following change in your medication: stop simvastatin and ranexa begin pravastatin 40mg  daily Prescriptions: PRAVASTATIN SODIUM 40 MG TABS (PRAVASTATIN SODIUM) Take 1 tablet by mouth once a day  #30 x 3   Entered by:   Teressa Lower RN   Authorized by:   Kathlen Brunswick, MD, [facility]   Signed by:   Teressa Lower RN on 04/18/2010   Method used:   Electronically to        [facility]* (retail)       [address]       [place], [place]  [address]       Ph: [phone]       Fax: [phone]   RxID:   [id number]

## 2010-11-08 NOTE — Letter (Signed)
Summary: [place] [employer] [facility] [employer], agricultural at [employer]  [address], [place] [address]   Phone: [phone]  Fax: [phone]     April 18, 2010 MRN: [id number]   [facility] [address] [place], [place]  [address]      YOUR LAB WORK IS DUE   June 20, 2010  Please go to [facility], located across the street from [facility] on the second floor.  Hours are Monday - Friday 7am until 7:30pm         Saturday 8am until 12noon    _X_  DO NOT EAT OR DRINK AFTER MIDNIGHT EVENING PRIOR TO LABWORK  __ YOUR LABWORK IS NOT FASTING --YOU MAY EAT PRIOR TO LABWORK

## 2010-11-08 NOTE — Assessment & Plan Note (Signed)
Summary: post cath per Aurea Graff in [facility] lab/tg   Visit Type:  Follow-up Referring Provider:  rothbart Primary Provider:  [facility]   History of Present Illness:  Mr. Sabino Gasser is a very pleasant 55 y/o CM with known history of CAD with most recent cardiac cath 03/2009 and PCI to the posterior lateral CX using DES, and PCI to restonitic stents of the midportion of the Cx using 2 overlaping DES.  He continued to have recurrent pain and a stress myoview was completed. This demonstrated profound infereolateral ischemia without evidence of infarction.  He subsequently had a cardiac cath on 03/04/10 by Dr. Gala Romney. This revealed that the Cx was a large dominant vessel that was totally occluded.  There was a 50% lesion in the mid AV groove CX right before the stent.The stent to the RCA was was widely patent.  LV 55%-60%.    The films were reviewed by Dr. Juanda Chance and by Dr. Riley Kill.  "Given that he has already two layers of DES, which he reoccluded,, likehood that he would be able to keep this area open after angioplasty would be quite low."  He was started on Imdur 30mg  daily.  Since that time the patient states he has "felt fine," except for a headache.  However, digging a hole and doing some exertional activity has caused him to have chest tightness.  He took on Ntg and rested, and the pain went away.    He is very concerned about his prognosis and option that have been outlined by Dr. Gala Romney, to include medical therapy, brachytherapy (done at [facility] in [place]), and single vessel bypass. He is concerned that each time he has chest pain that it is his angina.  He wishes to discuss the options more thoroghly.  Current Medications (verified): 1)  Amlodipine Besylate 10 Mg Tabs (Amlodipine Besylate) .Marland Kitchen.. 1 Tab Once Daily 2)  Metformin Hcl 1000 Mg Tabs (Metformin Hcl) .... Take 1/2 Tab Two Times A Day 3)  Metoprolol Succinate 50 Mg Xr24h-Tab (Metoprolol Succinate) .... Take 1  and 1/2 Tabs Once Daily 4)  Aspirin 325 Mg Tabs (Aspirin) 5)  Diovan Hct 320-25 Mg Tabs (Valsartan-Hydrochlorothiazide) .Marland Kitchen.. 1 Tab Once Daily 6)  Viagra 100 Mg Tabs (Sildenafil Citrate) .... 1/2 Tab As Needed 7)  Protonix 40 Mg Tbec (Pantoprazole Sodium) .... Take 1 Tab Once Daily 8)  Plavix 75 Mg Tabs (Clopidogrel Bisulfate) .... Take One Tablet By Mouth Daily 9)  Simvastatin 20 Mg Tabs (Simvastatin) .... Take 1 Tab Daily 10)  Clonidine Hcl 0.1 Mg Tabs (Clonidine Hcl) .... Take One Tablet By Mouth Twice A Day 11)  Nitrostat 0.4 Mg Subl (Nitroglycerin) .Marland Kitchen.. 1 Tablet Under Tongue At Onset of Chest Pain; You May Repeat Every 5 Minutes For Up To 3 Doses. 12)  Ranexa 500 Mg Xr12h-Tab (Ranolazine) .... Take 1 Tablet By Mouth Two Times A Day  Allergies (verified): No Known Drug Allergies PMH-FH-SH reviewed-no changes except otherwise noted  Review of Systems       anixety related to this.  All other systems have been reviewed and are negative unless stated above.   Vital Signs:  Patient profile:   55 year old male Weight:      189 pounds Pulse rate:   85 / minute BP sitting:   134 / 79  (right arm)  Vitals Entered By: Dreama Saa, CNA (March 15, 2010 3:33 PM)  Physical Exam  General:  Well developed, well nourished, in no acute distress.  Lungs:  Clear bilaterally to auscultation and percussion. Heart:  Non-displaced PMI, chest non-tender; regular rate and rhythm, S1, S2 without murmurs, rubs or gallops. Carotid upstroke normal, no bruit. Normal abdominal aortic size, no bruits. Femorals normal pulses, no bruits. Pedals normal pulses. No edema, no varicosities. Abdomen:  Bowel sounds positive; abdomen soft and non-tender without masses, organomegaly, or hernias noted. No hepatosplenomegaly. Msk:  Back normal, normal gait. Muscle strength and tone normal. Psych:  anxious.     EKG  Procedure date:  03/15/2010  Findings:      Normal sinus rhythm with rate of:  84bpm/  LAFB,  possible anterior infarct.  QTc 425 ms.  Impression & Recommendations:  Problem # 1:  ATHEROSCLEROTIC CARDIOVASCULAR DISEASE (ICD-429.2) A lengthy discussion is had with the patient by Dr. Dietrich Pates and myself concerning his prognosis and options.  Dr. Dietrich Pates has offered reassurance and options to him in a step-wise manner.  We will stop the Imdur as it is not assisting in angina relief.  Will begin Ranexa 500mg  two times a day and titrate up to 1000 mg two times a day for angina pain.    Secondly as discussion for referral to [facility] system was offered.  If medical therapy is not helpful or giving him relief of angina. Thirdly, single vessel CABG would be considered.  Mr. Sabino Gasser understands and agrees with Dr. Langston Masker recommendations and wants to avoid CABG.  He is willing to try the Renexa.  He will see Dr. Dietrich Pates in a couple of weeks to disuss futher symtpoms.  He is advised to take SL nitro before strenuous activity and as needed.    Problem # 2:  HYPERTENSION (ICD-401.9) Assessment: Unchanged  His updated medication list for this problem includes:    Amlodipine Besylate 10 Mg Tabs (Amlodipine besylate) .Marland Kitchen... 1 tab once daily    Metoprolol Succinate 50 Mg Xr24h-tab (Metoprolol succinate) .Marland Kitchen... Take 1 and 1/2 tabs once daily    Aspirin 325 Mg Tabs (Aspirin)    Diovan Hct 320-25 Mg Tabs (Valsartan-hydrochlorothiazide) .Marland Kitchen... 1 tab once daily    Clonidine Hcl 0.1 Mg Tabs (Clonidine hcl) .Marland Kitchen... Take one tablet by mouth twice a day  Patient Instructions: 1)  Your physician recommends that you schedule a follow-up appointment in: 2 weeks with Dr. Dietrich Pates 2)  Your physician has recommended you make the following change in your medication: Stop taking Imdur and start taking Ranexa 500mg  by mouth two times a day  Prescriptions: RANEXA 500 MG XR12H-TAB (RANOLAZINE) take 1 tablet by mouth two times a day  #60 x 0   Entered by:   Larita Fife Via LPN   Authorized by:   Joni Reining, NP   Signed  by:   Larita Fife Via LPN on 28/31/5176   Method used:   Electronically to        [facility] (retail)       [address]       [place], [place]  [address]       Ph: [phone]       Fax: [phone]   RxID:   [id number]   Appended Document: post cath per Aurea Graff in [facility] lab/tg    Clinical Lists Changes  Observations: Added new observation of CARDIO HPI: 26 June, 2011  Cardiac catheterization images were reviewed.  The mid circumflex was totally obstructed at the site of the previously placed stent.  A 75% lesion was seen in the first  marginal.  There was a 50% narrowing in a large ramus, a very distal 90-99% lesion in the LAD that otherwise manifested luminal irregularities faint visualization of the PDA via collaterals and  proximal RCA occlusion also at the site of a previous stent.  The right coronary artery was nondominant.  Stent to a moderate size first diagonal was widely patent.  Right to left collaterals were seen in the mid circumflex as well.  Left ventricular systolic function was normal.  This equates to significant two-vessel disease with preserved left ventricular systolic function and ischemia visualized during nuclear stress.  Circulation is maintained in the distribution of the proximal circumflex, and there are significant collaterals.   Initial trial of medical therapy is warranted and will be undertaken.  Rendon Bing, M.D.   (04/04/2010 10:14) Added new observation of REFERRING MD: . (04/04/2010 10:14) Added new observation of PRIMARY MD: [facility] (04/04/2010 10:14)       Referring Provider:  . Primary Provider:  [facility]   History of Present Illness: 26 June, 2011  Cardiac catheterization images were reviewed.  The mid circumflex was totally obstructed at the site of the previously placed stent.  A 75% lesion was seen in the first marginal.  There was a 50%  narrowing in a large ramus, a very distal 90-99% lesion in the LAD that otherwise manifested luminal irregularities faint visualization of the PDA via collaterals and  proximal RCA occlusion also at the site of a previous stent.  The right coronary artery was nondominant.  Stent to a moderate size first diagonal was widely patent.  Right to left collaterals were seen in the mid circumflex as well.  Left ventricular systolic function was normal.  This equates to significant two-vessel disease with preserved left ventricular systolic function and ischemia visualized during nuclear stress.  Circulation is maintained in the distribution of the proximal circumflex, and there are significant collaterals.   Initial trial of medical therapy is warranted and will be undertaken.  Wayzata Bing, M.D.

## 2010-11-08 NOTE — Progress Notes (Signed)
Summary: rx  Phone Note From Pharmacy Call back at [phone]   Caller: [facility] Caller: [facility] Summary of Call: ref [id number] needs to speak with nurse about drug therapy notiofication on amlodipine. they also faxed over on 04/13/10 for the same thing needs to here back by 04/26/10. Initial call taken by: Faythe Ghee,  April 22, 2010 3:49 PM    Prescriptions: PRAVASTATIN SODIUM 40 MG TABS (PRAVASTATIN SODIUM) Take 1 tablet by mouth once a day  #30 x 6   Entered by:   Teressa Lower RN   Authorized by:   Gaylord Shih, MD, [facility]   Signed by:   Teressa Lower RN on 04/22/2010   Method used:   Print then Give to Patient   RxID:   [id number]

## 2010-11-08 NOTE — Miscellaneous (Signed)
Summary: EKG Tracing Interpretation  Clinical Lists Changes  Observations: Added new observation of PRIMARY MD: [facility] (02/04/2010 11:43)

## 2010-11-08 NOTE — Assessment & Plan Note (Signed)
Summary: 2 wk f/u per checkout on 03/15/10/tg   Visit Type:  Follow-up Referring Provider:  . Primary Provider:  [facility]   History of Present Illness: [name] returns to the office as scheduled for continued assessment and treatment of angina.  Since his last visit, chest discomfort has completely resolved.  He has required no nitroglycerin whereas he was using multiple doses per week prior to that.  He still complains of class II dyspnea on exertion, but is very satisfied with his level of function.  Unfortunately, over the past few days, he has noted a pruritic skin eruption over the abdomen and inner thighs.  Symptoms are worse at night, and have prevented him from sleeping.  He reports no new medications other than ranolazine.  Current Medications (verified): 1)  Amlodipine Besylate 10 Mg Tabs (Amlodipine Besylate) .Marland Kitchen.. 1 Tab Once Daily 2)  Metformin Hcl 1000 Mg Tabs (Metformin Hcl) .... Take 1 Tablet By Mouth Two Times A Day 3)  Metoprolol Succinate 50 Mg Xr24h-Tab (Metoprolol Succinate) .... Take 1 and 1/2 Tabs Once Daily 4)  Aspirin 325 Mg Tabs (Aspirin) 5)  Diovan Hct 320-25 Mg Tabs (Valsartan-Hydrochlorothiazide) .Marland Kitchen.. 1 Tab Once Daily 6)  Viagra 100 Mg Tabs (Sildenafil Citrate) .... 1/2 Tab As Needed 7)  Protonix 40 Mg Tbec (Pantoprazole Sodium) .... Take 1 Tab Once Daily 8)  Plavix 75 Mg Tabs (Clopidogrel Bisulfate) .... Take One Tablet By Mouth Daily 9)  Simvastatin 20 Mg Tabs (Simvastatin) .... Take 1 Tab Daily 10)  Clonidine Hcl 0.1 Mg Tabs (Clonidine Hcl) .... Take One Tablet By Mouth Twice A Day 11)  Nitrostat 0.4 Mg Subl (Nitroglycerin) .Marland Kitchen.. 1 Tablet Under Tongue At Onset of Chest Pain; You May Repeat Every 5 Minutes For Up To 3 Doses. 12)  Ranexa 500 Mg Xr12h-Tab (Ranolazine) .... Take 1 Tablet By Mouth Two Times A Day 13)  Hydroxyzine Hcl 25 Mg Tabs (Hydroxyzine Hcl) .Marland Kitchen.. 1 Tablet At Bedtime and Two Times A Day As Needed 14)  Prednisone  20 Mg Tabs (Prednisone) .... Take 1 Tablet By Mouth Two Times A Day X 3 Days Then Once Daily  Allergies (verified): No Known Drug Allergies  Past History:  PMH, FH, and Social History reviewed and updated.   Review of Systems       The patient complains of dyspnea on exertion.  The patient denies weight loss, weight gain, chest pain, syncope, peripheral edema, prolonged cough, headaches, and abdominal pain.    Vital Signs:  Patient profile:   55 year old male Weight:      187 pounds Pulse rate:   87 / minute BP sitting:   122 / 75  (right arm)  Vitals Entered By: Dreama Saa, CNA (April 05, 2010 1:38 PM)  Physical Exam  General:  Well developed; no acute distress:   Neck-No JVD; no carotid bruits: Lungs-No tachypnea, no rales; no rhonchi; no wheezes: Cardiovascular-normal PMI; normal S1 and S2; minimal basilar systolic ejection murmur Abdomen-BS normal; soft and non-tender without masses or organomegaly:  Musculoskeletal-No deformities, no cyanosis or clubbing: Neurologic-Normal cranial nerves; symmetric strength and tone:  Skin-Warm, Salmon-colored predominantly macular rash over the abdomen and thighs with a few scattered papules; minor excoriations Extremities-Nl distal pulses; no edema:     Impression & Recommendations:  Problem # 1:  ATHEROSCLEROTIC CARDIOVASCULAR DISEASE (ICD-429.2) Patient has had an outstanding response to ranolazine, but has developed a skin rash, which is almost certainly due to the drug as well.  A literature search reveals no definite major skin pathology with this drug, but rashes are not infrequent.  Since we have few options other than CABG surgery for treatment of [name]' symptoms, we will try to treat his rash and continue the medication.  He is cautioned to call for any progression of skin findings.  He will use topical preparations, prednisone at a dose of 20 mg b.i.d. for 3 days then 20 mg q.d. and Atarax.  I will reassess his status  in one week.  In the interim, Dr. Juanda Chance will review his films to recommend any possible percutaneous options.  04/08/10-Discussed with Dr. Juanda Chance.  He is willing to approach the total RCA occlusion, but etimated the longterm liklihood of success at 25% and advises CABG if revascularization proves necessary.  This will be presented to [name] at his next visit.  Patient Instructions: 1)  Your physician recommends that you schedule a follow-up appointment in: 7-10 days 2)  Your physician has recommended you make the following change in your medication: atarax 25mg  every night and two times a day as needed  MAY CAUSE DROWSINESS 3)  prednisone 20mg  two times a day x3 days then decrease to 20mg  daily 4)  ALVEENO SKIN RELIEF BODY WASH OR BATH TREATMENT AS NEEDED Prescriptions: METFORMIN HCL 1000 MG TABS (METFORMIN HCL) take 1 tablet by mouth two times a day  #180 x 3   Entered by:   Teressa Lower RN   Authorized by:   Kathlen Brunswick, MD, [facility]   Signed by:   Teressa Lower RN on 04/06/2010   Method used:   Electronically to        [facility] (retail)       [address]       [place], [place]  [address]       Ph: [phone]       Fax: [phone]   RxID:   [id number]

## 2010-11-08 NOTE — Miscellaneous (Signed)
Summary: pre cath orders  Clinical Lists Changes  Problems: Added new problem of MYOCARDIAL PERFUSION SCAN, WITH STRESS TEST, ABNORMAL (ICD-794.39) Added new problem of UNSPECIFIED PRE-OPERATIVE EXAMINATION (ICD-V72.84) - pre cath labs Orders: Added new Referral order of Cardiac Catheterization (Cardiac Cath) - Signed Added new Test order of T-Basic Metabolic Panel [phone]) - Signed Added new Test order of T-CBC w/Diff [phone]) - Signed Added new Test order of T-Protime, Auto ([id number]) - Signed Added new Test order of T-PTT ([id number]) - Signed

## 2010-12-23 LAB — CBC
HCT: 27.2 % — ABNORMAL LOW (ref 39.0–52.0)
HCT: 28.7 % — ABNORMAL LOW (ref 39.0–52.0)
HCT: 28.7 % — ABNORMAL LOW (ref 39.0–52.0)
HCT: 30.9 % — ABNORMAL LOW (ref 39.0–52.0)
HCT: 32.8 % — ABNORMAL LOW (ref 39.0–52.0)
HCT: 33.2 % — ABNORMAL LOW (ref 39.0–52.0)
HCT: 33.4 % — ABNORMAL LOW (ref 39.0–52.0)
Hemoglobin: 10 g/dL — ABNORMAL LOW (ref 13.0–17.0)
Hemoglobin: 10.7 g/dL — ABNORMAL LOW (ref 13.0–17.0)
Hemoglobin: 10.8 g/dL — ABNORMAL LOW (ref 13.0–17.0)
Hemoglobin: 11.2 g/dL — ABNORMAL LOW (ref 13.0–17.0)
Hemoglobin: 9.3 g/dL — ABNORMAL LOW (ref 13.0–17.0)
Hemoglobin: 9.6 g/dL — ABNORMAL LOW (ref 13.0–17.0)
Hemoglobin: 9.7 g/dL — ABNORMAL LOW (ref 13.0–17.0)
MCH: 27.2 pg (ref 26.0–34.0)
MCH: 27.3 pg (ref 26.0–34.0)
MCH: 27.3 pg (ref 26.0–34.0)
MCH: 27.4 pg (ref 26.0–34.0)
MCH: 27.4 pg (ref 26.0–34.0)
MCH: 27.4 pg (ref 26.0–34.0)
MCH: 28.1 pg (ref 26.0–34.0)
MCHC: 32.2 g/dL (ref 30.0–36.0)
MCHC: 32.4 g/dL (ref 30.0–36.0)
MCHC: 32.9 g/dL (ref 30.0–36.0)
MCHC: 33.4 g/dL (ref 30.0–36.0)
MCHC: 33.5 g/dL (ref 30.0–36.0)
MCHC: 33.8 g/dL (ref 30.0–36.0)
MCHC: 34.2 g/dL (ref 30.0–36.0)
MCV: 80 fL (ref 78.0–100.0)
MCV: 80.8 fL (ref 78.0–100.0)
MCV: 81.8 fL (ref 78.0–100.0)
MCV: 83 fL (ref 78.0–100.0)
MCV: 83.9 fL (ref 78.0–100.0)
MCV: 84.2 fL (ref 78.0–100.0)
MCV: 84.9 fL (ref 78.0–100.0)
Platelets: 112 10*3/uL — ABNORMAL LOW (ref 150–400)
Platelets: 133 10*3/uL — ABNORMAL LOW (ref 150–400)
Platelets: 144 10*3/uL — ABNORMAL LOW (ref 150–400)
Platelets: 146 10*3/uL — ABNORMAL LOW (ref 150–400)
Platelets: 157 10*3/uL (ref 150–400)
Platelets: 163 10*3/uL (ref 150–400)
Platelets: 196 10*3/uL (ref 150–400)
RBC: 3.4 MIL/uL — ABNORMAL LOW (ref 4.22–5.81)
RBC: 3.51 MIL/uL — ABNORMAL LOW (ref 4.22–5.81)
RBC: 3.55 MIL/uL — ABNORMAL LOW (ref 4.22–5.81)
RBC: 3.67 MIL/uL — ABNORMAL LOW (ref 4.22–5.81)
RBC: 3.91 MIL/uL — ABNORMAL LOW (ref 4.22–5.81)
RBC: 3.95 MIL/uL — ABNORMAL LOW (ref 4.22–5.81)
RBC: 3.98 MIL/uL — ABNORMAL LOW (ref 4.22–5.81)
RDW: 13.6 % (ref 11.5–15.5)
RDW: 13.8 % (ref 11.5–15.5)
RDW: 14 % (ref 11.5–15.5)
RDW: 14.2 % (ref 11.5–15.5)
RDW: 14.4 % (ref 11.5–15.5)
RDW: 14.5 % (ref 11.5–15.5)
RDW: 14.5 % (ref 11.5–15.5)
WBC: 12.1 10*3/uL — ABNORMAL HIGH (ref 4.0–10.5)
WBC: 13.4 10*3/uL — ABNORMAL HIGH (ref 4.0–10.5)
WBC: 14.4 10*3/uL — ABNORMAL HIGH (ref 4.0–10.5)
WBC: 8.9 10*3/uL (ref 4.0–10.5)
WBC: 9.1 10*3/uL (ref 4.0–10.5)
WBC: 9.5 10*3/uL (ref 4.0–10.5)
WBC: 9.7 10*3/uL (ref 4.0–10.5)

## 2010-12-23 LAB — GLUCOSE, CAPILLARY
Glucose-Capillary: 117 mg/dL — ABNORMAL HIGH (ref 70–99)
Glucose-Capillary: 121 mg/dL — ABNORMAL HIGH (ref 70–99)
Glucose-Capillary: 121 mg/dL — ABNORMAL HIGH (ref 70–99)
Glucose-Capillary: 127 mg/dL — ABNORMAL HIGH (ref 70–99)
Glucose-Capillary: 139 mg/dL — ABNORMAL HIGH (ref 70–99)
Glucose-Capillary: 141 mg/dL — ABNORMAL HIGH (ref 70–99)
Glucose-Capillary: 143 mg/dL — ABNORMAL HIGH (ref 70–99)
Glucose-Capillary: 148 mg/dL — ABNORMAL HIGH (ref 70–99)
Glucose-Capillary: 148 mg/dL — ABNORMAL HIGH (ref 70–99)
Glucose-Capillary: 153 mg/dL — ABNORMAL HIGH (ref 70–99)
Glucose-Capillary: 154 mg/dL — ABNORMAL HIGH (ref 70–99)
Glucose-Capillary: 155 mg/dL — ABNORMAL HIGH (ref 70–99)
Glucose-Capillary: 162 mg/dL — ABNORMAL HIGH (ref 70–99)
Glucose-Capillary: 167 mg/dL — ABNORMAL HIGH (ref 70–99)
Glucose-Capillary: 167 mg/dL — ABNORMAL HIGH (ref 70–99)
Glucose-Capillary: 177 mg/dL — ABNORMAL HIGH (ref 70–99)
Glucose-Capillary: 178 mg/dL — ABNORMAL HIGH (ref 70–99)
Glucose-Capillary: 184 mg/dL — ABNORMAL HIGH (ref 70–99)
Glucose-Capillary: 186 mg/dL — ABNORMAL HIGH (ref 70–99)
Glucose-Capillary: 191 mg/dL — ABNORMAL HIGH (ref 70–99)
Glucose-Capillary: 201 mg/dL — ABNORMAL HIGH (ref 70–99)
Glucose-Capillary: 213 mg/dL — ABNORMAL HIGH (ref 70–99)
Glucose-Capillary: 213 mg/dL — ABNORMAL HIGH (ref 70–99)
Glucose-Capillary: 219 mg/dL — ABNORMAL HIGH (ref 70–99)
Glucose-Capillary: 224 mg/dL — ABNORMAL HIGH (ref 70–99)
Glucose-Capillary: 227 mg/dL — ABNORMAL HIGH (ref 70–99)
Glucose-Capillary: 230 mg/dL — ABNORMAL HIGH (ref 70–99)
Glucose-Capillary: 233 mg/dL — ABNORMAL HIGH (ref 70–99)
Glucose-Capillary: 237 mg/dL — ABNORMAL HIGH (ref 70–99)
Glucose-Capillary: 243 mg/dL — ABNORMAL HIGH (ref 70–99)
Glucose-Capillary: 246 mg/dL — ABNORMAL HIGH (ref 70–99)
Glucose-Capillary: 294 mg/dL — ABNORMAL HIGH (ref 70–99)
Glucose-Capillary: 336 mg/dL — ABNORMAL HIGH (ref 70–99)
Glucose-Capillary: 97 mg/dL (ref 70–99)
Glucose-Capillary: 98 mg/dL (ref 70–99)

## 2010-12-23 LAB — POCT I-STAT 4, (NA,K, GLUC, HGB,HCT)
Glucose, Bld: 104 mg/dL — ABNORMAL HIGH (ref 70–99)
Glucose, Bld: 118 mg/dL — ABNORMAL HIGH (ref 70–99)
Glucose, Bld: 135 mg/dL — ABNORMAL HIGH (ref 70–99)
Glucose, Bld: 142 mg/dL — ABNORMAL HIGH (ref 70–99)
Glucose, Bld: 149 mg/dL — ABNORMAL HIGH (ref 70–99)
Glucose, Bld: 160 mg/dL — ABNORMAL HIGH (ref 70–99)
Glucose, Bld: 198 mg/dL — ABNORMAL HIGH (ref 70–99)
Glucose, Bld: 278 mg/dL — ABNORMAL HIGH (ref 70–99)
Glucose, Bld: 95 mg/dL (ref 70–99)
HCT: 25 % — ABNORMAL LOW (ref 39.0–52.0)
HCT: 25 % — ABNORMAL LOW (ref 39.0–52.0)
HCT: 26 % — ABNORMAL LOW (ref 39.0–52.0)
HCT: 26 % — ABNORMAL LOW (ref 39.0–52.0)
HCT: 26 % — ABNORMAL LOW (ref 39.0–52.0)
HCT: 26 % — ABNORMAL LOW (ref 39.0–52.0)
HCT: 27 % — ABNORMAL LOW (ref 39.0–52.0)
HCT: 31 % — ABNORMAL LOW (ref 39.0–52.0)
HCT: 35 % — ABNORMAL LOW (ref 39.0–52.0)
Hemoglobin: 10.5 g/dL — ABNORMAL LOW (ref 13.0–17.0)
Hemoglobin: 11.9 g/dL — ABNORMAL LOW (ref 13.0–17.0)
Hemoglobin: 8.5 g/dL — ABNORMAL LOW (ref 13.0–17.0)
Hemoglobin: 8.5 g/dL — ABNORMAL LOW (ref 13.0–17.0)
Hemoglobin: 8.8 g/dL — ABNORMAL LOW (ref 13.0–17.0)
Hemoglobin: 8.8 g/dL — ABNORMAL LOW (ref 13.0–17.0)
Hemoglobin: 8.8 g/dL — ABNORMAL LOW (ref 13.0–17.0)
Hemoglobin: 8.8 g/dL — ABNORMAL LOW (ref 13.0–17.0)
Hemoglobin: 9.2 g/dL — ABNORMAL LOW (ref 13.0–17.0)
Potassium: 3.1 mEq/L — ABNORMAL LOW (ref 3.5–5.1)
Potassium: 3.3 mEq/L — ABNORMAL LOW (ref 3.5–5.1)
Potassium: 3.4 mEq/L — ABNORMAL LOW (ref 3.5–5.1)
Potassium: 3.5 mEq/L (ref 3.5–5.1)
Potassium: 3.7 mEq/L (ref 3.5–5.1)
Potassium: 3.7 mEq/L (ref 3.5–5.1)
Potassium: 4.5 mEq/L (ref 3.5–5.1)
Potassium: 4.6 mEq/L (ref 3.5–5.1)
Potassium: 4.9 mEq/L (ref 3.5–5.1)
Sodium: 134 mEq/L — ABNORMAL LOW (ref 135–145)
Sodium: 135 mEq/L (ref 135–145)
Sodium: 136 mEq/L (ref 135–145)
Sodium: 136 mEq/L (ref 135–145)
Sodium: 136 mEq/L (ref 135–145)
Sodium: 137 mEq/L (ref 135–145)
Sodium: 138 mEq/L (ref 135–145)
Sodium: 139 mEq/L (ref 135–145)
Sodium: 141 mEq/L (ref 135–145)

## 2010-12-23 LAB — BASIC METABOLIC PANEL
BUN: 16 mg/dL (ref 6–23)
BUN: 7 mg/dL (ref 6–23)
BUN: 9 mg/dL (ref 6–23)
BUN: 9 mg/dL (ref 6–23)
CO2: 25 mEq/L (ref 19–32)
CO2: 29 mEq/L (ref 19–32)
CO2: 30 mEq/L (ref 19–32)
CO2: 32 mEq/L (ref 19–32)
Calcium: 7.9 mg/dL — ABNORMAL LOW (ref 8.4–10.5)
Calcium: 8.5 mg/dL (ref 8.4–10.5)
Calcium: 8.8 mg/dL (ref 8.4–10.5)
Calcium: 9.2 mg/dL (ref 8.4–10.5)
Chloride: 103 mEq/L (ref 96–112)
Chloride: 111 mEq/L (ref 96–112)
Chloride: 96 mEq/L (ref 96–112)
Chloride: 99 mEq/L (ref 96–112)
Creatinine, Ser: 0.98 mg/dL (ref 0.4–1.5)
Creatinine, Ser: 1 mg/dL (ref 0.4–1.5)
Creatinine, Ser: 1 mg/dL (ref 0.4–1.5)
Creatinine, Ser: 1.2 mg/dL (ref 0.4–1.5)
GFR calc Af Amer: >60 mL/min (ref >60)
GFR calc Af Amer: >60 mL/min (ref >60)
GFR calc Af Amer: >60 mL/min (ref >60)
GFR calc Af Amer: >60 mL/min (ref >60)
GFR calc non Af Amer: >60 mL/min (ref >60)
GFR calc non Af Amer: >60 mL/min (ref >60)
GFR calc non Af Amer: >60 mL/min (ref >60)
GFR calc non Af Amer: >60 mL/min (ref >60)
Glucose, Bld: 123 mg/dL — ABNORMAL HIGH (ref 70–99)
Glucose, Bld: 131 mg/dL — ABNORMAL HIGH (ref 70–99)
Glucose, Bld: 141 mg/dL — ABNORMAL HIGH (ref 70–99)
Glucose, Bld: 183 mg/dL — ABNORMAL HIGH (ref 70–99)
Potassium: 3.6 mEq/L (ref 3.5–5.1)
Potassium: 3.6 mEq/L (ref 3.5–5.1)
Potassium: 3.8 mEq/L (ref 3.5–5.1)
Potassium: 4.1 mEq/L (ref 3.5–5.1)
Sodium: 135 mEq/L (ref 135–145)
Sodium: 139 mEq/L (ref 135–145)
Sodium: 139 mEq/L (ref 135–145)
Sodium: 143 mEq/L (ref 135–145)

## 2010-12-23 LAB — POCT I-STAT, CHEM 8
BUN: 7 mg/dL (ref 6–23)
BUN: 9 mg/dL (ref 6–23)
Calcium, Ion: 1.08 mmol/L — ABNORMAL LOW (ref 1.12–1.32)
Calcium, Ion: 1.12 mmol/L (ref 1.12–1.32)
Chloride: 103 mEq/L (ref 96–112)
Chloride: 108 mEq/L (ref 96–112)
Creatinine, Ser: 1 mg/dL (ref 0.4–1.5)
Creatinine, Ser: 1 mg/dL (ref 0.4–1.5)
Glucose, Bld: 139 mg/dL — ABNORMAL HIGH (ref 70–99)
Glucose, Bld: 225 mg/dL — ABNORMAL HIGH (ref 70–99)
HCT: 27 % — ABNORMAL LOW (ref 39.0–52.0)
HCT: 40 % (ref 39.0–52.0)
Hemoglobin: 13.6 g/dL (ref 13.0–17.0)
Hemoglobin: 9.2 g/dL — ABNORMAL LOW (ref 13.0–17.0)
Potassium: 3.7 mEq/L (ref 3.5–5.1)
Potassium: 4.5 mEq/L (ref 3.5–5.1)
Sodium: 139 mEq/L (ref 135–145)
Sodium: 141 mEq/L (ref 135–145)
TCO2: 22 mmol/L (ref 0–100)
TCO2: 25 mmol/L (ref 0–100)

## 2010-12-23 LAB — POCT I-STAT 3, ART BLOOD GAS (G3+)
Acid-Base Excess: 2 mmol/L (ref 0.0–2.0)
Acid-Base Excess: 4 mmol/L — ABNORMAL HIGH (ref 0.0–2.0)
Acid-base deficit: 2 mmol/L (ref 0.0–2.0)
Acid-base deficit: 2 mmol/L (ref 0.0–2.0)
Acid-base deficit: 2 mmol/L (ref 0.0–2.0)
Acid-base deficit: 3 mmol/L — ABNORMAL HIGH (ref 0.0–2.0)
Acid-base deficit: 3 mmol/L — ABNORMAL HIGH (ref 0.0–2.0)
Acid-base deficit: 3 mmol/L — ABNORMAL HIGH (ref 0.0–2.0)
Acid-base deficit: 4 mmol/L — ABNORMAL HIGH (ref 0.0–2.0)
Acid-base deficit: 4 mmol/L — ABNORMAL HIGH (ref 0.0–2.0)
Acid-base deficit: 4 mmol/L — ABNORMAL HIGH (ref 0.0–2.0)
Acid-base deficit: 5 mmol/L — ABNORMAL HIGH (ref 0.0–2.0)
Bicarbonate: 20.4 mEq/L (ref 20.0–24.0)
Bicarbonate: 20.9 mEq/L (ref 20.0–24.0)
Bicarbonate: 21.2 mEq/L (ref 20.0–24.0)
Bicarbonate: 21.4 mEq/L (ref 20.0–24.0)
Bicarbonate: 21.5 mEq/L (ref 20.0–24.0)
Bicarbonate: 22.2 mEq/L (ref 20.0–24.0)
Bicarbonate: 22.7 mEq/L (ref 20.0–24.0)
Bicarbonate: 22.8 mEq/L (ref 20.0–24.0)
Bicarbonate: 23 mEq/L (ref 20.0–24.0)
Bicarbonate: 23.2 mEq/L (ref 20.0–24.0)
Bicarbonate: 26.9 mEq/L — ABNORMAL HIGH (ref 20.0–24.0)
Bicarbonate: 29.1 mEq/L — ABNORMAL HIGH (ref 20.0–24.0)
O2 Saturation: 100 %
O2 Saturation: 76 %
O2 Saturation: 87 %
O2 Saturation: 88 %
O2 Saturation: 89 %
O2 Saturation: 91 %
O2 Saturation: 93 %
O2 Saturation: 95 %
O2 Saturation: 95 %
O2 Saturation: 96 %
O2 Saturation: 97 %
O2 Saturation: 97 %
Patient temperature: 35.3
Patient temperature: 37
Patient temperature: 37.2
Patient temperature: 37.3
Patient temperature: 37.5
Patient temperature: 37.8
Patient temperature: 98
Patient temperature: 98
Patient temperature: 98.7
TCO2: 22 mmol/L (ref 0–100)
TCO2: 22 mmol/L (ref 0–100)
TCO2: 22 mmol/L (ref 0–100)
TCO2: 22 mmol/L (ref 0–100)
TCO2: 23 mmol/L (ref 0–100)
TCO2: 23 mmol/L (ref 0–100)
TCO2: 24 mmol/L (ref 0–100)
TCO2: 24 mmol/L (ref 0–100)
TCO2: 24 mmol/L (ref 0–100)
TCO2: 24 mmol/L (ref 0–100)
TCO2: 28 mmol/L (ref 0–100)
TCO2: 30 mmol/L (ref 0–100)
pCO2 arterial: 32.9 mmHg — ABNORMAL LOW (ref 35.0–45.0)
pCO2 arterial: 34.2 mmHg — ABNORMAL LOW (ref 35.0–45.0)
pCO2 arterial: 36.6 mmHg (ref 35.0–45.0)
pCO2 arterial: 37.1 mmHg (ref 35.0–45.0)
pCO2 arterial: 37.2 mmHg (ref 35.0–45.0)
pCO2 arterial: 39.6 mmHg (ref 35.0–45.0)
pCO2 arterial: 40.4 mmHg (ref 35.0–45.0)
pCO2 arterial: 41.5 mmHg (ref 35.0–45.0)
pCO2 arterial: 41.7 mmHg (ref 35.0–45.0)
pCO2 arterial: 42 mmHg (ref 35.0–45.0)
pCO2 arterial: 42.6 mmHg (ref 35.0–45.0)
pCO2 arterial: 44.5 mmHg (ref 35.0–45.0)
pH, Arterial: 7.317 — ABNORMAL LOW (ref 7.350–7.450)
pH, Arterial: 7.323 — ABNORMAL LOW (ref 7.350–7.450)
pH, Arterial: 7.336 — ABNORMAL LOW (ref 7.350–7.450)
pH, Arterial: 7.344 — ABNORMAL LOW (ref 7.350–7.450)
pH, Arterial: 7.348 — ABNORMAL LOW (ref 7.350–7.450)
pH, Arterial: 7.35 (ref 7.350–7.450)
pH, Arterial: 7.365 (ref 7.350–7.450)
pH, Arterial: 7.397 (ref 7.350–7.450)
pH, Arterial: 7.402 (ref 7.350–7.450)
pH, Arterial: 7.43 (ref 7.350–7.450)
pH, Arterial: 7.44 (ref 7.350–7.450)
pH, Arterial: 7.442 (ref 7.350–7.450)
pO2, Arterial: 101 mmHg — ABNORMAL HIGH (ref 80.0–100.0)
pO2, Arterial: 356 mmHg — ABNORMAL HIGH (ref 80.0–100.0)
pO2, Arterial: 42 mmHg — ABNORMAL LOW (ref 80.0–100.0)
pO2, Arterial: 52 mmHg — ABNORMAL LOW (ref 80.0–100.0)
pO2, Arterial: 57 mmHg — ABNORMAL LOW (ref 80.0–100.0)
pO2, Arterial: 60 mmHg — ABNORMAL LOW (ref 80.0–100.0)
pO2, Arterial: 61 mmHg — ABNORMAL LOW (ref 80.0–100.0)
pO2, Arterial: 66 mmHg — ABNORMAL LOW (ref 80.0–100.0)
pO2, Arterial: 75 mmHg — ABNORMAL LOW (ref 80.0–100.0)
pO2, Arterial: 77 mmHg — ABNORMAL LOW (ref 80.0–100.0)
pO2, Arterial: 87 mmHg (ref 80.0–100.0)
pO2, Arterial: 90 mmHg (ref 80.0–100.0)

## 2010-12-23 LAB — CREATININE, SERUM
Creatinine, Ser: 0.87 mg/dL (ref 0.4–1.5)
Creatinine, Ser: 0.94 mg/dL (ref 0.4–1.5)
GFR calc Af Amer: >60 mL/min (ref >60)
GFR calc Af Amer: >60 mL/min (ref >60)
GFR calc non Af Amer: >60 mL/min (ref >60)
GFR calc non Af Amer: >60 mL/min (ref >60)

## 2010-12-23 LAB — MAGNESIUM
Magnesium: 2.1 mg/dL (ref 1.5–2.5)
Magnesium: 2.5 mg/dL (ref 1.5–2.5)
Magnesium: 2.9 mg/dL — ABNORMAL HIGH (ref 1.5–2.5)

## 2010-12-23 LAB — PROTIME-INR
INR: 1.31 (ref 0.00–1.49)
Prothrombin Time: 16.2 seconds — ABNORMAL HIGH (ref 11.6–15.2)

## 2010-12-23 LAB — PLATELET COUNT: Platelets: 133 10*3/uL — ABNORMAL LOW (ref 150–400)

## 2010-12-23 LAB — POCT I-STAT GLUCOSE
Glucose, Bld: 220 mg/dL — ABNORMAL HIGH (ref 70–99)
Operator id: 300801

## 2010-12-23 LAB — HEMOGLOBIN AND HEMATOCRIT, BLOOD
HCT: 26 % — ABNORMAL LOW (ref 39.0–52.0)
Hemoglobin: 9 g/dL — ABNORMAL LOW (ref 13.0–17.0)

## 2010-12-23 LAB — APTT: aPTT: 37 seconds (ref 24–37)

## 2010-12-24 LAB — COMPREHENSIVE METABOLIC PANEL
ALT: 24 U/L (ref 0–53)
ALT: 31 U/L (ref 0–53)
AST: 25 U/L (ref 0–37)
AST: 27 U/L (ref 0–37)
Albumin: 3.8 g/dL (ref 3.5–5.2)
Albumin: 4 g/dL (ref 3.5–5.2)
Alkaline Phosphatase: 146 U/L — ABNORMAL HIGH (ref 39–117)
Alkaline Phosphatase: 146 U/L — ABNORMAL HIGH (ref 39–117)
BUN: 14 mg/dL (ref 6–23)
BUN: 16 mg/dL (ref 6–23)
CO2: 21 mEq/L (ref 19–32)
CO2: 22 mEq/L (ref 19–32)
Calcium: 10.2 mg/dL (ref 8.4–10.5)
Calcium: 9.9 mg/dL (ref 8.4–10.5)
Chloride: 101 mEq/L (ref 96–112)
Chloride: 99 mEq/L (ref 96–112)
Creatinine, Ser: 1.09 mg/dL (ref 0.4–1.5)
Creatinine, Ser: 1.1 mg/dL (ref 0.4–1.5)
GFR calc Af Amer: >60 mL/min (ref >60)
GFR calc Af Amer: >60 mL/min (ref >60)
GFR calc non Af Amer: >60 mL/min (ref >60)
GFR calc non Af Amer: >60 mL/min (ref >60)
Glucose, Bld: 296 mg/dL — ABNORMAL HIGH (ref 70–99)
Glucose, Bld: 434 mg/dL — ABNORMAL HIGH (ref 70–99)
Potassium: 4.2 mEq/L (ref 3.5–5.1)
Potassium: 4.5 mEq/L (ref 3.5–5.1)
Sodium: 131 mEq/L — ABNORMAL LOW (ref 135–145)
Sodium: 132 mEq/L — ABNORMAL LOW (ref 135–145)
Total Bilirubin: 0.4 mg/dL (ref 0.3–1.2)
Total Bilirubin: 0.5 mg/dL (ref 0.3–1.2)
Total Protein: 6.3 g/dL (ref 6.0–8.3)
Total Protein: 6.4 g/dL (ref 6.0–8.3)

## 2010-12-24 LAB — PROTIME-INR
INR: 0.92 (ref 0.00–1.49)
INR: 0.95 (ref 0.00–1.49)
Prothrombin Time: 12.3 seconds (ref 11.6–15.2)
Prothrombin Time: 12.6 seconds (ref 11.6–15.2)

## 2010-12-24 LAB — URINALYSIS, ROUTINE W REFLEX MICROSCOPIC
Bilirubin Urine: NEGATIVE
Bilirubin Urine: NEGATIVE
Glucose, UA: 500 mg/dL — AB
Glucose, UA: >1000 mg/dL — AB
Hgb urine dipstick: NEGATIVE
Hgb urine dipstick: NEGATIVE
Ketones, ur: NEGATIVE mg/dL
Ketones, ur: NEGATIVE mg/dL
Leukocytes, UA: NEGATIVE
Nitrite: NEGATIVE
Nitrite: NEGATIVE
Protein, ur: NEGATIVE mg/dL
Protein, ur: NEGATIVE mg/dL
Specific Gravity, Urine: 1.019 (ref 1.005–1.030)
Specific Gravity, Urine: 1.026 (ref 1.005–1.030)
Urobilinogen, UA: 0.2 mg/dL (ref 0.0–1.0)
Urobilinogen, UA: 0.2 mg/dL (ref 0.0–1.0)
pH: 6.5 (ref 5.0–8.0)
pH: 6.5 (ref 5.0–8.0)

## 2010-12-24 LAB — CBC
HCT: 40.1 % (ref 39.0–52.0)
HCT: 41 % (ref 39.0–52.0)
Hemoglobin: 13.7 g/dL (ref 13.0–17.0)
Hemoglobin: 14.2 g/dL (ref 13.0–17.0)
MCH: 28.5 pg (ref 26.0–34.0)
MCH: 28.7 pg (ref 26.0–34.0)
MCHC: 34.3 g/dL (ref 30.0–36.0)
MCHC: 34.7 g/dL (ref 30.0–36.0)
MCV: 82.8 fL (ref 78.0–100.0)
MCV: 83.3 fL (ref 78.0–100.0)
Platelets: 179 10*3/uL (ref 150–400)
Platelets: 197 10*3/uL (ref 150–400)
RBC: 4.82 MIL/uL (ref 4.22–5.81)
RBC: 4.95 MIL/uL (ref 4.22–5.81)
RDW: 14.3 % (ref 11.5–15.5)
RDW: 14.4 % (ref 11.5–15.5)
WBC: 8.1 10*3/uL (ref 4.0–10.5)
WBC: 8.4 10*3/uL (ref 4.0–10.5)

## 2010-12-24 LAB — BLOOD GAS, ARTERIAL
Acid-base deficit: 0.8 mmol/L (ref 0.0–2.0)
Acid-base deficit: 1.5 mmol/L (ref 0.0–2.0)
Bicarbonate: 22.7 mEq/L (ref 20.0–24.0)
Bicarbonate: 23.3 mEq/L (ref 20.0–24.0)
Drawn by: 181601
Drawn by: 181601
FIO2: 0.21 %
FIO2: 0.21 %
O2 Saturation: 96.8 %
O2 Saturation: 97.6 %
Patient temperature: 98.6
Patient temperature: 98.6
TCO2: 23.8 mmol/L (ref 0–100)
TCO2: 24.4 mmol/L (ref 0–100)
pCO2 arterial: 37.6 mmHg (ref 35.0–45.0)
pCO2 arterial: 37.9 mmHg (ref 35.0–45.0)
pH, Arterial: 7.398 (ref 7.350–7.450)
pH, Arterial: 7.405 (ref 7.350–7.450)
pO2, Arterial: 84 mmHg (ref 80.0–100.0)
pO2, Arterial: 90 mmHg (ref 80.0–100.0)

## 2010-12-24 LAB — HEMOGLOBIN A1C
Hgb A1c MFr Bld: 11 % — ABNORMAL HIGH (ref <5.7)
Hgb A1c MFr Bld: 11.2 % — ABNORMAL HIGH (ref <5.7)
Mean Plasma Glucose: 269 mg/dL — ABNORMAL HIGH (ref <117)
Mean Plasma Glucose: 275 mg/dL — ABNORMAL HIGH (ref <117)

## 2010-12-24 LAB — TYPE AND SCREEN
ABO/RH(D): AB POS
ABO/RH(D): AB POS
Antibody Screen: NEGATIVE
Antibody Screen: NEGATIVE

## 2010-12-24 LAB — SURGICAL PCR SCREEN
MRSA, PCR: NEGATIVE
MRSA, PCR: NEGATIVE
Staphylococcus aureus: NEGATIVE
Staphylococcus aureus: NEGATIVE

## 2010-12-24 LAB — ABO/RH: ABO/RH(D): AB POS

## 2010-12-24 LAB — APTT
aPTT: 27 seconds (ref 24–37)
aPTT: 27 seconds (ref 24–37)

## 2010-12-24 LAB — URINE MICROSCOPIC-ADD ON

## 2010-12-25 LAB — BASIC METABOLIC PANEL
BUN: 19 mg/dL (ref 6–23)
CO2: 23 mEq/L (ref 19–32)
Calcium: 9.9 mg/dL (ref 8.4–10.5)
Chloride: 98 mEq/L (ref 96–112)
Creatinine, Ser: 1.05 mg/dL (ref 0.4–1.5)
GFR calc Af Amer: >60 mL/min (ref >60)
GFR calc non Af Amer: >60 mL/min (ref >60)
Glucose, Bld: 271 mg/dL — ABNORMAL HIGH (ref 70–99)
Potassium: 4 mEq/L (ref 3.5–5.1)
Sodium: 136 mEq/L (ref 135–145)

## 2010-12-25 LAB — HEMOGLOBIN AND HEMATOCRIT, BLOOD
HCT: 44.8 % (ref 39.0–52.0)
Hemoglobin: 15.2 g/dL (ref 13.0–17.0)

## 2010-12-26 LAB — POCT I-STAT GLUCOSE
Glucose, Bld: 218 mg/dL — ABNORMAL HIGH (ref 70–99)
Operator id: 221371

## 2011-01-16 LAB — GLUCOSE, CAPILLARY
Glucose-Capillary: 219 mg/dL — ABNORMAL HIGH (ref 70–99)
Glucose-Capillary: 232 mg/dL — ABNORMAL HIGH (ref 70–99)
Glucose-Capillary: 249 mg/dL — ABNORMAL HIGH (ref 70–99)

## 2011-01-16 LAB — CARDIAC PANEL(CRET KIN+CKTOT+MB+TROPI)
CK, MB: 2.2 ng/mL (ref 0.3–4.0)
CK, MB: 2.2 ng/mL (ref 0.3–4.0)
Relative Index: 0.8 (ref 0.0–2.5)
Total CK: 65 U/L (ref 7–232)

## 2011-01-16 LAB — BASIC METABOLIC PANEL
BUN: 12 mg/dL (ref 6–23)
CO2: 28 mEq/L (ref 19–32)
Chloride: 102 mEq/L (ref 96–112)
Creatinine, Ser: 1.1 mg/dL (ref 0.4–1.5)
Glucose, Bld: 233 mg/dL — ABNORMAL HIGH (ref 70–99)

## 2011-01-16 LAB — CBC
HCT: 40.5 % (ref 39.0–52.0)
HCT: 42.1 % (ref 39.0–52.0)
Hemoglobin: 15 g/dL (ref 13.0–17.0)
MCHC: 34.9 g/dL (ref 30.0–36.0)
MCV: 81.6 fL (ref 78.0–100.0)
MCV: 82.6 fL (ref 78.0–100.0)
Platelets: 166 10*3/uL (ref 150–400)
Platelets: 190 10*3/uL (ref 150–400)
RDW: 14 % (ref 11.5–15.5)
RDW: 14.1 % (ref 11.5–15.5)
WBC: 10.9 10*3/uL — ABNORMAL HIGH (ref 4.0–10.5)

## 2011-01-16 LAB — PROTIME-INR
INR: 1 (ref 0.00–1.49)
Prothrombin Time: 12.8 seconds (ref 11.6–15.2)

## 2011-01-16 LAB — LIPID PANEL
LDL Cholesterol: UNDETERMINED mg/dL (ref 0–99)
VLDL: UNDETERMINED mg/dL (ref 0–40)

## 2011-01-16 LAB — CK TOTAL AND CKMB (NOT AT ARMC): Total CK: 63 U/L (ref 7–232)

## 2011-01-16 LAB — HEMOGLOBIN A1C: Mean Plasma Glucose: 275 mg/dL

## 2011-01-17 LAB — POCT CARDIAC MARKERS
CKMB, poc: <1 ng/mL — ABNORMAL LOW (ref 1.0–8.0)
Troponin i, poc: <0.05 ng/mL (ref 0.00–0.09)

## 2011-01-17 LAB — BASIC METABOLIC PANEL
BUN: 13 mg/dL (ref 6–23)
CO2: 25 mEq/L (ref 19–32)
Chloride: 101 mEq/L (ref 96–112)
Creatinine, Ser: 1.04 mg/dL (ref 0.4–1.5)
Potassium: 4 mEq/L (ref 3.5–5.1)

## 2011-02-07 ENCOUNTER — Ambulatory Visit (AMBULATORY_SURGERY_CENTER): Payer: Managed Care, Other (non HMO) | Admitting: *Deleted

## 2011-02-07 VITALS — Ht 65.0 in | Wt 194.0 lb

## 2011-02-07 DIAGNOSIS — Z1211 Encounter for screening for malignant neoplasm of colon: Secondary | ICD-10-CM

## 2011-02-07 MED ORDER — PEG-KCL-NACL-NASULF-NA ASC-C 100 G PO SOLR: ORAL | Status: DC

## 2011-02-08 ENCOUNTER — Encounter: Payer: Self-pay | Admitting: Internal Medicine

## 2011-02-21 ENCOUNTER — Ambulatory Visit (AMBULATORY_SURGERY_CENTER): Payer: Managed Care, Other (non HMO) | Admitting: Internal Medicine

## 2011-02-21 ENCOUNTER — Encounter: Payer: Self-pay | Admitting: Internal Medicine

## 2011-02-21 VITALS — BP 154/93 | HR 95 | Temp 97.4°F | Resp 14 | Ht 65.0 in | Wt 192.0 lb

## 2011-02-21 DIAGNOSIS — Z860100 Personal history of colon polyps, unspecified: Secondary | ICD-10-CM

## 2011-02-21 DIAGNOSIS — Z1211 Encounter for screening for malignant neoplasm of colon: Secondary | ICD-10-CM

## 2011-02-21 DIAGNOSIS — K648 Other hemorrhoids: Secondary | ICD-10-CM

## 2011-02-21 DIAGNOSIS — D126 Benign neoplasm of colon, unspecified: Secondary | ICD-10-CM

## 2011-02-21 LAB — GLUCOSE, CAPILLARY
Glucose-Capillary: 167 mg/dL — ABNORMAL HIGH (ref 70–99)
Glucose-Capillary: 141 mg/dL — ABNORMAL HIGH (ref 70–99)

## 2011-02-21 MED ORDER — HYDROCORTISONE ACETATE 25 MG RE SUPP: 25.0000 mg | Freq: Two times a day (BID) | RECTAL | Status: DC

## 2011-02-21 NOTE — Consult Note (Signed)
NEW PATIENT CONSULTATION   [name], [name]  DOB:  10/23/1955                                        April 27, 2010  CHART #:  [id number]   REASON FOR CONSULTATION:  1. Severe multivessel coronary artery disease with class III      congestive heart failure and angina.  2. Previous percutaneous intervention with stents in the right      coronary, circumflex, and diagonal.  3. Diabetes mellitus.   HISTORY OF PRESENT ILLNESS:  I was asked to evaluate this 55 year old  Caucasian male, reformed smoker with diabetes for possible multivessel  coronary artery bypass grafting.  The patient has a long history of  coronary artery disease and had his first stent placed over 10 years  ago.  He has had recurrent symptomatic angina, and over the years has  had a stent placed in the nondominant right coronary, the diagonal  branch of the LAD, and he has had 2 stents placed in the circumflex  (stent in-stent).  Despite these interventions, he continues to have  symptomatic angina and was recently placed on Ranexa which resulted in a  rash and the Ranexa was stopped and he completed a course of oral  prednisone.  He is still able to work in Event organiser at [employer] but is exhausted by the end of the day.  He  denies resting symptoms.  Overall, his EF has remained preserved.  His  last cath was performed by Dr. Gala Romney in May 2011.  His LVEDP was 24  and there was no aortic stenosis.  His EF was 55%.  The LAD had a  moderate 50% stenosis in the midsection and a high-grade 95% stenosis at  the apex where the vessel was fairly small.  A stent in the first  diagonal was patent.  The circumflex was dominant and had a stent which  with an occluded vessel distal to the stent.  The distal circumflex  filled via collaterals from the right coronary.  The ramus branch off  the left coronary had a 50% stenosis.  The right coronary was small an  nondominant,  and through a AV nodal branch the distal circumflex was  collateralized.  Because of the patient's severe multivessel disease and  symptomatic angina and inability to treat him with further percutaneous  intervention.  A surgical evaluation was requested.  The patient has a  history of chronic Plavix use.  He also smoked but has basically stopped  over the past 6 months.  The rash from the Ranexa has significantly  improved and he is off prednisone.   PAST MEDICAL HISTORY:  1. Diabetes, last A1c ranged between 6 and 7.  2. Hypertension.  3. Drug-induced rash, improved.  4. GERD.  5. No known drug allergies except for Ranexa.  6. Hyperlipidemia.  7. Chronic elevated creatinine 1.4-1.5.   HOME MEDICATIONS:  1. Plavix 75 mg daily.  2. Norvasc 10 mg daily.  3. Metformin 1 g b.i.d.  4. Toprol-XL 100 mg daily.  5. Aspirin 325 mg daily.  6. Diovan 320/12.5 mg daily.  7. Viagra p.r.n.  8. Protonix 40 mg daily.  9. Clonidine 0.1 mg p.o. b.i.d.  10.Pravastatin 40 mg daily.  11.Xanax 0.5 mg daily p.r.n.   SOCIAL HISTORY:  The patient works at [employer] in [employer].  He stopped smoking.  He does not drink alcohol.  He is  married.   FAMILY HISTORY:  Positive for diabetes and hypertension.   REVIEW OF SYSTEMS:  He has had a previous appendectomy without  anesthetic or surgical complication.  His weight has been stable.  He  has significant and progressively decreasing exercise intolerance due to  his dyspnea with exertion and exertional angina.  He denies any  significant history of thoracic trauma.  No history of cardiac murmur,  heart valve disease, or arrhythmia.  GI review is negative for  hepatitis, jaundice, or blood per rectum.  Urologic review is negative  for kidney stones or BPH.  Vacular review is negative for DVT,  claudication, pulmonary embolus, or TIA.  He has had total dental  extractions.  He denies aspiration or difficulty swallowing.  His   diabetes has been fairly uncomplicated without neuropathy, without skin  ulcers, and without hospital admissions for control.   PHYSICAL EXAMINATION:  He is 5 feet 5 inches, weighs 188 pounds.  Blood  pressure 145/84, pulse 86 and regular, respirations 18, saturation on  room air 98%.  He is a middle-aged Caucasian male accompanied by his  wife, in no acute distress.  HEENT exam is normocephalic.  He has full  dentures.  Pupils are equal.  Neck is without JVD, mass, or carotid  bruit.  Lymphatics reveal no palpable supraclavicular or cervical  adenopathy.  Thorax is without deformity.  Breath sounds are clear and  equal.  Cardiac exam is regular rhythm without S3, gallop, murmur, or  rub.  Abdominal exam is obese, soft, nontender without pulsatile mass.  Extremities reveal no clubbing, cyanosis, or edema.  Peripheral pulses  are intact in all extremities.  He has a papular macular rash over the  distal tibial area and ankles.  He has some chronic venous stasis  changes in his lower extremities with brown pigmentation to the skin, No  ulceration.  Neurologic exam is alert and oriented without focal motor  deficit.   LABORATORY DATA:  Reviewed the recent cardiac cath by Dr. Gala Romney.  He  has multivessel disease.  He would benefit from bypass grafts to the LAD  proximally and distally, the ramus intermediate, and the distal  circumflex.   IMPRESSION AND PLAN:  I have discussed the results of cardiac cath and  surgical options with the patient and his wife.  We reviewed the  indications, benefits, and risks of surgical revascularization as well  as the alternatives to therapy and the expected recovery.  He also  understands the potential risks.  The patient will agree to stop his  Plavix for the next 5 days and we will prepare him for surgery on May 02, 2010, and obtain preoperative carotid Dopplers and preoperative 2-D  echo and the preoperative standard blood panel.   Kerin Perna, M.D.  Electronically Signed   PV/MEDQ  D:  04/27/2010  T:  04/28/2010  Job:  [id number]   cc:   Gerrit Friends. Dietrich Pates, MD, [facility]  Ernestina Penna, M.D.

## 2011-02-21 NOTE — Discharge Summary (Signed)
NAMEGREYDEN, [name] NO.:  [id number]   MEDICAL RECORD NO.:  [id number]          PATIENT TYPE:  INP   LOCATION:  3733                         FACILITY:  [facility]   PHYSICIAN:  Rollene Rotunda, MD, FACCDATE OF BIRTH:  12/27/1955   DATE OF ADMISSION:  01/19/2008  DATE OF DISCHARGE:  01/21/2008                               DISCHARGE SUMMARY   PRIMARY CARDIOLOGIST:  Gerrit Friends. Dietrich Pates, MD, [facility].   PRIMARY CARE PHYSICIAN:  Bennie Pierini, PA-C at [facility].   DISCHARGE DIAGNOSIS:  Unstable angina/coronary artery disease.   SECONDARY DIAGNOSES:  1. Hypertension.  2. Hyperlipidemia.  3. Diabetes mellitus.  4. Ongoing tobacco abuse.   ALLERGIES:  NO KNOWN DRUG ALLERGIES.   PROCEDURE:  Left heart catheterization.   HISTORY OF PRESENT ILLNESS:  The patient is a 55 year old male with  prior history of CAD, status post previous stenting x3 and who presented  to the [facility] ED January 20, 2008 with a 2-night history of chest pain  lasting 10 minutes, resolving spontaneously.  On April 13, he had  stuttering chest pain throughout the day that was worse with activity.  In the ED his ECG showed no acute changes and cardiac markers were  negative.  He was admitted for evaluation of catheterization.   HOSPITAL COURSE:  The patient had mild elevation of his troponin to 0.10  on the third set with normal CKs and MBs.  Underwent left heart cardiac  catheterization on April 13 revealing diffuse nonobstructive disease  with a 70% stenosis within the previously placed mid left circumflex  stent.  The EF was 65% with normal wall motion.  It was felt that he  would best be managed medically.  However, we have scheduled him for an  adenosine Myoview to take place this morning in our office to further  evaluate any possible ischemic burden of the circumflex in-stent  restenosis.  If he has an abnormal perfusion study, we would then  consider PCI to  that area..   [name]' admission chest x-ray which was abnormal showing asymmetric  left apical opacity and a PA and lateral follow-up was performed this  morning which showed chronic lung changes, no acute processes and no  evidence for left apical density that was  previously noted.   [name]' was counseled on the importance of smoking cessation and  seen by the smoking cessation team.  He plans to quit cold [place].   [name] has not had any additional chest pain and will discharged  home today in good condition with follow-up Myoview later this morning.   DISCHARGE LABORATORY DATA:  Hemoglobin 13.5, hematocrit 39.3, WBC 10.4,  platelets 191,000, INR 1.  Sodium 137, potassium 3.3 (replaced prior to  discharge), chloride 101, CO2 26, BUN 15, creatinine 1.13, glucose 226,  calcium 9.4, hemoglobin A1c 9.7, CK 35, MB 1.6, troponin-I 0.03, total  cholesterol 134, triglycerides 325, HDL 28, LDL 41.   DISPOSITION:  The patient is being discharged home today in good  condition.   FOLLOW-UP APPOINTMENTS:  He is scheduled for adenosine  Myoview this  morning, April 14 at 11:30 a.m. in our [place] office.  He will  follow with Dr. Dietrich Pates on April 29 to 3:15 p.m. or sooner if  necessary.  He is asked to follow up with Bennie Pierini, PA-C at  [facility] as well as a pharmacist there for  additional diabetes evaluation and management.   DISCHARGE MEDICATIONS:  1. Aspirin 325 mg daily.  2. Zocor 4 mg at bedtime.  3. Metformin 1000 mg daily to be resumed January 22, 2008.  4. Diovan/HCTZ 320/12.7 mg daily.  5. Tricor 145 mg daily.  6. Metoprolol ER 50 mg daily.  7. Norvasc 10 mg daily.  8. Zantac 150 mg daily.  9. Xanax as previously prescribed p.r.n.  10.Nitroglycerin 0.4 mg sublingual p.r.n. chest pain.   Outstanding lab studies:  None.   Duration discharge encounter:  50 minutes.      [name], ANP      Rollene Rotunda, MD,  [facility]  Electronically Signed    CB/MEDQ  D:  01/21/2008  T:  01/21/2008  Job:  [id number]   cc:   Bennie Pierini, [facility]

## 2011-02-21 NOTE — H&P (Signed)
NAMEBAYRON, DALTO NO.:  [id number]   MEDICAL RECORD NO.:  [id number]          PATIENT TYPE:  INP   LOCATION:  2303                         FACILITY:  [facility]   PHYSICIAN:  Unice Cobble, MD     DATE OF BIRTH:  January 11, 1956   DATE OF ADMISSION:  03/08/2009  DATE OF DISCHARGE:                              HISTORY & PHYSICAL   CARDIOLOGIST:  Gerrit Friends. Dietrich Pates, MD, [facility]   CHIEF COMPLAINT:  Chest pain.   HISTORY OF PRESENT ILLNESS:  This is a 55 year old white male with a  history of coronary artery disease with PCI in the past, who has been  having stuttering chest pain since Saturday morning.  The pain is  substernal and associated with shortness of breath and diaphoresis.  It  lasts for 2-5 minutes.  He has had several episodes and he had one here  in the ER tonight, where he had ST elevation during his pain but then it  resolved afterwards.  He does not have any edema, PND or orthopnea.  He  is compliant with his medications.  He continues to smoke.   PAST MEDICAL HISTORY:  1. Coronary artery disease, status post PCI to the RCA in 2003 and PCI      to the circumflex and first diagonal.  Catheterization last done in      April 2009 showed dual 50% lesions in the LAD and a 95% lesion      distally.  He has a 70% in-stent circumflex lesion.  His PDA has a      50% lesion.  His EF was 65% at that time.  No intervention was done      and a Myoview was done following the catheterization, which was      normal.  2. Diabetes mellitus.  3. Hyperlipidemia.  4. Hypertension.  5. COPD.  6. History of abnormal LFTs.   ALLERGIES:  No known drug allergies.   MEDICATIONS:  1,  Amlodipine 10 mg daily.  1. Simvastatin 40 mg daily.  2. Metformin 1000 mg b.i.d.  3. Metoprolol ER 50 mg daily.  4. Ranitidine 150 mg daily.  5. Aspirin 325 mg daily.  6. Multivitamin daily.  7. Diovan 320/25 daily.  8. Xanax p.r.n.   SOCIAL HISTORY:  Lives in [place] with his wife.   Still smokes 1/2  pack per day.  No alcohol or drugs.   FAMILY HISTORY:  Noncontributory.   REVIEW OF SYSTEMS:  A complete review of systems was done and found to  be otherwise negative except as stated in the HPI.   PHYSICAL EXAMINATION:  He is afebrile with a pulse of 84, respiratory  rate 16, blood pressure 164/91, O2 saturations are 98% on 2 liters.  GENERAL:  He is thin.  No acute distress.  HEENT:  PERRLA, EOMI, MMM, oropharynx without erythema or exudates.  NECK:  Supple without lymphadenopathy, thyromegaly, bruits or jugular  venous distention.  HEART:  Regular rate and rhythm with a normal S1 and S2.  No murmurs,  gallops or rubs.  Normal PMI.  Pulses 2+ and  equal bilaterally without  bruits.  LUNGS:  Have decreased breath sounds bilaterally but are otherwise  clear.  SKIN:  No rashes or lesions.  ABDOMEN:  Soft and nontender with normal bowel sounds.  No rebound or  guarding.  EXTREMITIES:  Show no cyanosis, clubbing or edema.  MUSCULOSKELETAL:  Shows no joint deformity, effusions or spine or CVA  tenderness.  NEUROLOGICAL:  He is alert and oriented x3 with cranial nerves II-XII  grossly intact.  Strength is 5/5 all extremities and axial groups with  normal sensation throughout.   RADIOLOGY:  Chest x-ray shows no acute cardiopulmonary disease.  EKG  shows a rate of 100 and sinus tachycardia.  He has 2-3 mm of ST  elevation inferiorly with lateral ST depression.  No hypertrophy.  The  next EKG shows the ST changes to be resolved.   LABORATORY DATA:  These are all pending at this time, although his  troponin is back and it is elevated at 0.11.  His creatinine is 1.0.  Glucose is 331.   ASSESSMENT/PLAN:  This is a 55 year old white male with a history of  coronary artery disease status post percutaneous coronary intervention,  who presents with non-ST elevation myocardial infarction.  The patient  had actually occluded coronary flow here in the emergency department   momentarily for approximately 2 minutes, but this is resolved by ECG and  he is now chest pain-free.  I will continue his home medications with  the exception of metformin (I will start insulin sliding scale).  His  beta blocker will be increased overnight to relieve heart strain.  Heparin, aspirin and Plavix as well as Integrilin will be started  tonight.  If pain recurs, then he will need urgent cardiac  catheterization.  Otherwise, catheterization will be done first thing in  the morning.      Unice Cobble, MD  Electronically Signed     ACJ/MEDQ  D:  03/09/2009  T:  03/09/2009  Job:  [id number]

## 2011-02-21 NOTE — Assessment & Plan Note (Signed)
 HEALTHCARE                       [place] CARDIOLOGY OFFICE NOTE   NAME:[name], [name]                     MRN:          [id number]  DATE:01/07/2008                            DOB:          [birth year]    REFERRING PHYSICIAN:  [facility]   Mr. Basquez returns to the office for continuing followup of coronary  artery disease and multiple cardiovascular risk factors.  Since his last  visit, he has done well from a clinical standpoint.  He reports no chest  discomfort nor dyspnea.  He continues to have the maintenance program at  [facility].  He monitors capillary blood glucose values at  home, which typically are less than 140 but sometimes near 200.  Blood  pressure control has been acceptable as far as he knows.  He was last  evaluated for chronic LFT abnormalities in 2006 when abdominal sonogram  was read as compatible with hepatic steatosis.   CURRENT MEDICATIONS:  1. Amlodipine 10 mg daily.  2. Simvastatin 40 mg daily.  3. Metformin 1000 mg b.i.d.  4. Diovan/hydrochlorothiazide 160/12.5 mg daily.  5. Metoprolol ER 50 mg daily.  6. Ranitidine 150 mg daily.  7. Aspirin 325 mg daily.  8. Tricor 145 mg daily.  9. He uses Xanax 0.5 mg p.r.n.   PHYSICAL EXAMINATION:  GENERAL:  Pleasant gentleman in no acute  distress.  VITAL SIGNS:  The weight is 194, 1 pound less than last year.  Blood  pressure 135/90, heart rate 66 and regular, respirations 18.  NECK:  No jugular venous distention; normal carotid upstrokes without  bruits.  LUNGS:  Clear.  CARDIAC:  Normal first and second heart sounds; modest systolic ejection  murmur present.  ABDOMEN:  Soft and nontender; no organomegaly; aortic pulsation not  palpable; no bruits.  EXTREMITIES:  Normal distal pulses; no edema.   IMPRESSION:  [name] is doing well overall.  A lipid profile and  hemoglobin A1c level will be obtained.  Appropriate medication  adjustments will be made.   Control of hypertension is marginal.  We will increase  Diovan/hydrochlorothiazide to 320/25 mg daily and recheck a chemistry  profile in 2 months.  There was a mention in Laretta Bolster note from  last year that Mr. Maciver would be referred to gastroenterology to  evaluate LFT  abnormalities.  This apparently was not accomplished and might be a good  idea.  I will plan see this nice gentleman again in 12 months.     Gerrit Friends. Dietrich Pates, MD, [facility]  Electronically Signed    RMR/MedQ  DD: 01/07/2008  DT: 01/07/2008  Job #: [id number]   cc:   [facility]

## 2011-02-21 NOTE — Assessment & Plan Note (Signed)
[facility] HEALTHCARE                       [place] CARDIOLOGY OFFICE NOTE   NAME:[name]                     MRN:          [id number]  DATE:03/29/2009                            DOB:          12-20-1955    CARDIOLOGIST:  [name] Friends. Dietrich Pates, MD, [facility]   PRIMARY CARE [name]:  [name] Pierini PA-C at [facility]   REASON FOR VISIT:  Post-hospitalization followup.   HISTORY OF PRESENT ILLNESS:  [name] is a 55 year old male patient  with history of coronary artery disease, status post multiple  percutaneous coronary  interventions in the past, who recently presented  to the [facility] with stuttering chest pain and enzyme  evidence of non-ST-elevation myocardial infarction.  He under went  cardiac catheterization by Dr. Juanda Chance, that demonstrated significant  disease in the AV circumflex of 80% proximal, 90% mid, and 80% mid  distal in-stent restenosis and 95% stenosis in the posterolateral branch  of the circumflex.  He received a drug-eluting stent (Xience) to the  posterior lateral branch of the circumflex and 2 overlapping drug-  eluting stents (Xience) to the mid AV circumflex.  His residual stenosis  included 50% ostial stenosis in the diagonal branch with less than 10%  narrowing in the stent, the proximal portion of the diagonal branch 40%  stenosis, the proximal LAD in-stent stenosis in the mid LAD, and 90%  very distal LAD stenosis near the apex.  His stent in the RCA was  patent.  His EF of 60%.  He tolerated the procedure well and had no  immediate complications.  He returns for followup today.  In reviewing  his hospital chart, he had significantly abnormal lipid profile with  total cholesterol 163, triglycerides 955, HDL 19, and LDL was not  calculated.  Also his diabetes is out of control with a hemoglobin A1c  of 11.2 which equates to mean glucose of 275.  He has not had any  adjustments in  his medications since discharge from the hospital.  Dr.  Juanda Chance did recommend long-term Plavix and aspirin therapy.  Of note, the  patient is on omeprazole for GERD and past history of esophageal  dilatation.  Previously, we had ranitidine listed on his medication  list.  He denies any recurrent chest pain or shortness of breath.  Denies orthopnea, PND, or pedal edema.  Denies any syncope.  He is now  committed to losing weight and exercise.  He has quit smoking and is  currently on Chantix which was prescribed at discharge.  The patient  also notes problems with erectile dysfunction in the past.  He is  requesting a refill for Viagra today.   CURRENT MEDICATIONS:  1. Amlodipine 10 mg daily.  2. Simvastatin 20 mg daily.  3. Metformin 1000 mg b.i.d.  4. Metoprolol ER 50 mg daily.  5. Aspirin 325 mg daily.  6. Multivitamin daily.  7. Diovan 320/25 mg daily.  8. Plavix 75 mg daily.  9. Omeprazole 10 mg daily.  10.Chantix as directed.  11.Xanax 0.5 mg p.r.n.   ALLERGIES:  No known drug allergies.  PHYSICAL EXAMINATION:  GENERAL:  He is a well-nourished, well-developed  male in no acute stress.  VITAL SIGNS:  Blood pressure is 135/79, pulse 76, and weight 191 pounds.  HEENT:  Normal without JVD.  CARDIAC:  S1 and S2.  Regular rate and rhythm.  LUNGS:  Clear to auscultation without wheezing, rhonchi, or rales.  ABDOMEN:  Soft and nontender.  EXTREMITIES:  Without edema.  NEUROLOGIC:  He is alert and oriented x3.  Cranial nerves II-XII are  grossly intact.  SKIN:  Warm and dry.  VASCULAR:  Right femoral arteriotomy site without hematoma or bruit.   ASSESSMENT AND PLAN:  1. Coronary artery disease, status post multiple percutaneous coronary      interventions in the past.  As noted, he recently presented to      [facility] with non-ST-elevation myocardial infarction.      He received drug-eluting stents to the posterolateral lateral      branch to the circumflex and 2  overlapping drug-eluting stents to      atrioventricular circumflex.  He has residual disease as outlined      above and overall preserved left ventricular function.  He is not      describing any further symptoms of angina.  His medications include      dual antiplatelet therapy and this should be continued long term.  2. Hypertension.  This is still somewhat uncontrolled.  He requires a      reading of less than 130/80 with his diabetes mellitus.  I have      recommended increasing his metoprolol to 75 mg a day.  I am not      certain this will provide much benefit reducing his blood pressure.      If he continues to remain above 130/80, then consideration should      be given towards adding some other agent such as clonidine or      hydralazine to help improve his pressure.  Currently, he is on      maximum dose of Diovan and HCTZ.  He is also on maximum dose of      amlodipine.  3. Diabetes mellitus.  This is uncontrolled.  As noted above, he had a      hemoglobin A1c over 11 while hospitalized.  I had a long discussion      with him today regarding the importance of controlling his diabetes      mellitus.  He is committed to lose weight and increasing his      activity.  I have asked him to follow up with [name]      to discuss further treatment options.  He may require the addition      of a sulfonylurea or possibly going on insulin.  4. Dyslipidemia.  Recent lipid panel in the hospital was significantly      abnormal.  I question whether or not this was a lab error.  He      admits to taking his simvastatin on a regular basis.  Initially, we      will proceed with followup CMET and fasting lipid panel.  If his      lipid control was suboptimal, we may need to consider the addition      of another agent or switching him from simvastatin to something      like Lipitor or Crestor.  5. Tobacco abuse.  As noted above, the patient has discontinued  cigarette smoking.   He continues on Chantix.  6. Erectile dysfunction.  I have refilled Viagra for him today 100 mg      half tablet to one whole tablet p.r.n., #10 with 2 refills.  I have      explained to him that his comorbid illnesses are likely      contributing to his erectile dysfunction.   DISPOSITION:  The patient will be brought back in followup with Dr.  Dietrich [name] in the next 6 weeks or sooner p.r.n.  I have given him a goal  of losing 10 pounds prior to the next visit.      Tereso Newcomer, PA-C  Electronically Signed      [name] Friends. Dietrich Pates, MD, [facility]  Electronically Signed   SW/MedQ  DD: 03/29/2009  DT: 03/30/2009  Job #: [id number]   cc:   [name] Pierini, PA-C

## 2011-02-21 NOTE — Assessment & Plan Note (Signed)
OFFICE VISIT   [name], [name]  DOB:  September 23, 1956                                        June 29, 2010  CHART #:  [id number]   HISTORY:  The patient returns for a wound check after undergoing  multivessel bypass grafting on May 09, 2000, for class III progressive  angina.  He is undergoing cardiac rehab and last week presented with  complaints of some redness around the lower part of the sternal  incision.  The sternum appeared to be well healed and there is no fever,  but he is a diabetic, so he was placed on Keflex 500 mg t.i.d. for 1  week.  He is continued to complete his cardiac rehab outpatient program.  He has had no fever and his blood sugars are under control.  He returns  now for a followup exam.   The sternal incision is now without any redness whatsoever.  It is still  somewhat hypersensitive, but solid and there is no signs of cellulitis.   PHYSICAL EXAMINATION:  Vital Signs:  Temperature 97, oxygen saturation  98%, blood pressure 156/90, and pulse 90.   PLAN:  The patient will return to the care of his primary cardiologist,  Dr. Dietrich Pates and his primary physician, Dr. Paulita Cradle, and return  here as needed.   Kerin Perna, M.D.  Electronically Signed   PV/MEDQ  D:  06/29/2010  T:  06/30/2010  Job:  [id number]

## 2011-02-21 NOTE — H&P (Signed)
NAMESAMARION, EHLE NO.:  [id number]   MEDICAL RECORD NO.:  [id number]          PATIENT TYPE:  EMS   LOCATION:  MAJO                         FACILITY:  [facility]   PHYSICIAN:  Bevelyn Buckles. Bensimhon, MDDATE OF BIRTH:  09-25-1956   DATE OF ADMISSION:  01/20/2008  DATE OF DISCHARGE:                              HISTORY & PHYSICAL   PRIMARY CARE Wilman Tucker:  Bennie Pierini, PA at [facility].   CARDIOLOGIST:  Dr. Dietrich Pates.   REASON FOR ADMISSION:  Unstable angina.   HISTORY OF PRESENT ILLNESS:  Mr. Mathers is a very pleasant 55 year old  male works as a Production designer, theatre/television/film man at [employer].  He has a  history of coronary artery disease, status post previous stenting x3; he  also has a history of hypertension, hyperlipidemia, diabetes and ongoing  tobacco use.   His last cardiac catheterization was by Dr. Riley Kill in May 2006; this  showed normal EF with patent stents.  There was a left dominant system.  The left main was okay.  The LAD had a 50% to 70% mid section and 90%  apical.  In the first diagonal, there was a 50% lesion.  In his left  circumflex, there 50% to 60% tandem lesions in the mid circumflex  followed by a 60% to 70% lesion distally and the ramus had a 90% lesion  in the upper branch, which had progressed from before, and a 40% to 50%  in the lower branch.  Right coronary was a nondominant vessel with 90%  proximal lesion.  He was treated medically.   Since that time, he has done well.  He saw Dr. Dietrich Pates at the end of  March without any complaints.  His blood pressure was elevated, so his  Diovan/hydrochlorothiazide was doubled.  Over the past 2 night, he says  he has awoke with chest pain which lasted about 10 minutes and resolved  spontaneously.  Today, he had some stuttering chest pain which was worse  with activity; he felt it was really bad today, so he came to the ER.  EKG showed a previous inferior infarct, but no  acute ST-T wave changes.  First set of cardiac markers have been negative.  He is now painfree.   REVIEW OF SYSTEMS:  He denies any fevers or chills.  No bright red blood  per rectum.  No melena.  No focal neurologic signs.  He does have some  arthritis pain.  Remainder of review of systems is negative except for  HPI and problem list.   PROBLEM LIST:  1. Coronary artery disease, status post 3 stents previously.      a.     Last cardiac catheterization in May 2006 as described       previously.  2. Chronic hypertension.  3. Hyperlipidemia.  4. Diabetes.  5. Ongoing tobacco use.  6. Gastroesophageal reflux disease.   CURRENT MEDICATIONS:  1. Aspirin 325 a day.  2. Metformin 500 p.o. b.i.d.  3. Toprol-XL 50 mg a day.  4. Diovan 320/25 a day.  5. Norvasc 10 a day.  6. Zocor 40 a day.   SOCIAL HISTORY:  He lives in [place] with his wife.  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  He has 3 children.  He  smokes about a half a pack a day, perhaps more, denies any alcohol.   FAMILY HISTORY:  Mother has no known coronary artery disease.  Father  died at 75 from heart failure.  One brother died from cancer.   PHYSICAL EXAM:  GENERAL:  He is comfortable.  He is sitting up in bed,  in no acute distress.  VITAL SIGNS:  He is afebrile.  Blood pressure is 148/90 and heart rate  is 85, saturating in the high 90s on 2 L.  HEENT:  Normal.  NECK:  Supple.  There is no JVD.  No lymphadenopathy or thyromegaly.  Carotid are 2+ bilaterally without bruits.  CARDIAC:  PMI is nondisplaced.  Regular rate and rhythm.  No murmurs,  rubs or gallops.  LUNGS:  Clear.  ABDOMEN:  Soft, nontender and non-distended.  No hepatosplenomegaly.  No  bruits.  No masses.  Good bowel sounds.  EXTREMITIES:  Warm with no  cyanosis, clubbing or edema.  No rash.  NEUROLOGIC:  Alert and oriented x3.  Cranial nerves II-XII are intact.  Moves all 4 extremities without difficulty.  Affect is  pleasant.   EKG:  Shows sinus rhythm with inferior Q waves, no acute ST-T wave  changes.   LABORATORY DATA:  White count is 9.8, hemoglobin 13.6, platelet 207,000.  Sodium 139, potassium 3.7, BUN 15, creatinine 1.3.  MB is less than 1.0.  Troponin is less than 0.05.   ASSESSMENT:  1. Unstable angina.  2. Coronary artery disease, status post 3 stents.  3. Diabetes.  4. Hypertension.  5. Hyperlipidemia.  6. Ongoing tobacco use.   PLAN:  We will admit him, put him on heparin, aspirin, statin, beta  blocker and nitroglycerin.  We will be not give him Plavix, given the  possibly of surgical disease.  He will undergo catheterization tomorrow.  We will hold metformin and start a sliding-scale insulin.  Smoking  cessation consult.      Bevelyn Buckles. Bensimhon, MD  Electronically Signed    DRB/MEDQ  D:  01/20/2008  T:  01/20/2008  Job:  [id number]   cc:   [facility] Paulene [facility] [place]  Gerrit Friends. Dietrich Pates, MD, [facility]

## 2011-02-21 NOTE — Assessment & Plan Note (Signed)
OFFICE VISIT   [name], [name]  DOB:  03-15-1956                                        June 21, 2010  CHART #:  [id number]   HISTORY:  The patient returns to office today for reevaluation of his  chest incision status post coronary bypass surgery by Dr. Donata Clay on  May 09, 2010.  He is a 55 year old diabetic smoker.  He was last seen  in the office by Dr. Donata Clay on June 01, 2010, at which time he was  doing well.  He returns today reporting that he has had some tenderness  and redness over the lower end of the sternum.  He denies any fever or  chills.  He has had no drainage from the incision.   PHYSICAL EXAMINATION:  The sternal incision is intact.  There is no  drainage.  The sternum feels stable with coughing.  There is a very  slight erythema in the skin at the lower end and the skin is somewhat  dry here.  He may have some early cellulitis but is very mild.   IMPRESSION:  The patient is doing well overall following coronary bypass  surgery.  His chest incision really looks good, but there could be some  early cellulitis at the bottom adjacent to the incision.  I do not see  any sign of active infection.  I will put him on Keflex 500 mg q.8 h. x7  days empirically in case he is developing some early cellulitis.  He  will return to see Dr. Donata Clay in 1 week for follow up.  I told him if  anything worsens in the meantime he has to call us immediately.   Evelene Croon, M.D.  Electronically Signed   BB/MEDQ  D:  06/21/2010  T:  06/22/2010  Job:  [id number]

## 2011-02-21 NOTE — Assessment & Plan Note (Signed)
[facility]                       [place] CARDIOLOGY OFFICE NOTE   NAME:[name], [name]                     MRN:          [id number]  DATE:02/17/2008                            DOB:          [birth year]    CARDIOLOGIST:  Gerrit Friends. Dietrich Pates, MD, [facility].   PRIMARY CARE Achaia Garlock:  Bennie Pierini, PA-C at [facility].   REASON FOR VISIT:  Posthospitalization followup.   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  [name] has had prior stenting to [name] RCA in [name]  setting of non-ST-elevation myocardial function in March 2003, as well  as [name] circumflex and diagonal artery.  [name] presented to [facility] on  January 19, 2008 with complaints of chest pain.  [name] did have an elevated  troponin at 0.1, but a follow-up troponin was negative at 0.03.  [name] was  referred for cardiac catheterization.  This revealed scattered disease  throughout.  [name] had dual 50% stenoses in [name] mid-LAD  as well an apical  95% stenosis.  There was a stent in [name] midcircumflex with 70% in-stent  restenosis.  [name] had a proximal 50% stenosis in [name] PDA and severe  diffuse disease in a very small posterolateral branch after [name] PDA.  [name]  had an RV branch with progressive disease, including a mid 99% stenosis.  His EF was normal at 65%, with normal wall motion.  It was decided to  discharge [name] and send him to our office in [place] for a  Myoview study to assess [name] circumflex territory, as [name] lesion within  his previously placed stent was questionably significant.  [name]  underwent an adenosine Myoview that revealed normal perfusion.  [name] had  an EF of 60%.   In [name] office today, [name] notes that [name] is doing well.  [name] has  not had any recurrent chest pain or shortness of breath.  [name] walks on a  daily basis without  symptoms.  [name] denies orthopnea, PND, or pedal edema.  Denies palpitations or syncope.  [name] does note 1-2 days of  rhinorrhea, nasal congestion, and a nonproductive cough.  [name] denies  fevers.  [name] does admit to continued tobacco abuse.   CURRENT MEDICATIONS:  1. Amlodipine 10 mg daily.  2. Simvastatin 40 mg daily.  3. Metformin 1000 mg b.i.d.  4. Metoprolol ER 50 mg daily.  5. Ranitidine 150 mg daily.  6. Aspirin 325 mg daily.  7. Multivitamin daily.  8. Diovan 320/25 mg daily.  9. Xanax p.r.n.   ALLERGIES:  NO KNOWN DRUG ALLERGIES.   PHYSICAL EXAMINATION:  GENERAL:  [name] is well-nourished, well-developed  male, in no acute distress.  VITAL SIGNS:  Blood pressure is 132/90, pulse 76, weight 191 pounds.  HEENT:  Normal.  NECK:  Without JVD.  CARDIAC:  Normal S1, S2.  Regular rate and rhythm, without murmur.  LUNGS:  Clear to auscultation bilaterally.  ABDOMEN:  Soft, nontender.  EXTREMITIES:  Without edema.  Calves soft, nontender.  SKIN:  Warm and dry.  NEUROLOGIC:  [name] is alert and oriented x3.  Cranial nerves II-XII grossly  intact.  VASCULAR:  Right femoral arteriotomy site without hematoma or bruit.   IMPRESSION:  1. Coronary artery disease.      a.     Prior stenting to [name] circumflex and diagonal.      b.     Status post stenting to [name] right coronary artery in [name]       setting of non-ST elevation myocardial infarction in March 2003.      c.     Moderate obstructive disease by recent catheterization, with       progressive stenosis in [name] stent in [name] circumflex, as well as       small vessel obstructive disease in [name] posterolaterals and right       ventricular branch - adenosine Myoview on January 21, 2008 without       ischemia (medical therapy).  2. Preserved left ventricular function.  3. Diabetes mellitus.  4. Hypertension.  5. Hyperlipidemia.  6. Chronic obstructive pulmonary disease, with ongoing tobacco abuse.  7. History of abnormal liver  function tests.  8. Upper respiratory tract infection symptoms.   PLAN:  1. [name] is doing well from a postcatheterization standpoint.  [name]      is not having recurrent chest pain or shortness of breath.  [name]      thinks that [name] may have experienced dyspepsia on [name] night of      admission.  2. [name] currently has symptoms of a URI.  I have given him      suggestions for over-[name]-counter cold medicine use that would be      safe with his coronary disease.  I have asked him to contact his      primary care Ryan Palermo or [place] should [name] develop high fevers or a      productive cough.  In [name] event that any of these occur, we could      consider prescribing antibiotic therapy.  3. [name] is due to have blood work with his primary care Briawna Carver this      week.  We have asked that [name] results of his BMET be faxed to [place].  4. I have asked him to try to quit smoking.  [name] tells me that [name] is      going to try Chantix with his primary care Hannelore Bova.  5. [name]'s blood pressure is somewhat elevated.  However, in [name]      setting of his URI, I have elected to not adjust his medications.      His goal would be less than 130/80 with his diabetes mellitus.      This can be further assessed with his primary care Sheyna Pettibone.  6. [name] will be brought back in followup with Dr. Dietrich Pates in      [name] next 3 months or sooner p.r.n.      Tereso Newcomer, PA-C  Electronically Signed      Gerrit Friends. Dietrich Pates, MD, [facility]  Electronically Signed   SW/MedQ  DD: 02/17/2008  DT: 02/17/2008  Job #: [id number]   cc:   Bennie Pierini, PA-C

## 2011-02-21 NOTE — Cardiovascular Report (Signed)
[name], [name]              ACCOUNT NO.:  [id number]   MEDICAL RECORD NO.:  [id number]          PATIENT TYPE:  INP   LOCATION:  2502                         FACILITY:  [facility]   PHYSICIAN:  Everardo Beals. Juanda Chance, MD, FACCDATE OF BIRTH:  09/11/1956   DATE OF PROCEDURE:  03/09/2009  DATE OF DISCHARGE:                            CARDIAC CATHETERIZATION   CLINICAL HISTORY:  Mr. Vanderloop has a history of known coronary disease  and has had prior stent placed in the mid circumflex artery in the  diagonal branch of the LAD and proximal right coronary artery.  He was  admitted with chest pain and positive enzymes, consistent with non-ST-  elevation myocardial function.   PROCEDURE:  Procedure was performed via the right femoral artery using  an arterial sheath and initially 5-French preformed coronary catheters.  After completion of the diagnostic study, we made decision to proceed  with intervention on the tandem lesions in the mid circumflex artery and  in the posterolateral branch of the circumflex artery.   The patient was on an Integrilin drip and was given an additional  heparin as well and the ACT greater than 200 seconds.  He had been given  Plavix load last night, chewable aspirin this morning.  We chose the  CLS4 guiding catheter with side holes.  We passed a [place] wire down  the circumflex artery into the posterolateral branch without difficulty.  We predilated the 3 tandem lesions in the mid circumflex artery  including the in-stent restenotic lesion in the circumflex artery with a  2.25 x 20 mm Apex balloon, and we also predilated the lesion in the  posterolateral branch with the same balloon.  We then deployed a 2.5 x  15 mm XIENCE stent at the lesion in the proximal portion of the large  posterolateral branch.  Inflated with 1 inflation of 8 atmospheres for  30 seconds.  We postdilated it with a 2.75 x 12 mm [place] Apex performing 2  inflations up to 16 atmospheres for 30  seconds.  We then deployed a 2.75  x 28 mm XIENCE stent in the mid circumflex artery.  The proximal edge of  the stent ended just inside the old stent.  We deployed this with 1  inflation of 12 atmospheres for 30 seconds.  We then deployed a second  2.75 x 28 mm XIENCE stent proximal to overlapping the previous stent.  We deployed this with 1 inflation up to 14 atmospheres for 30 seconds.  We then postdilated both stents with a 3.25 x 20 mm [place] Apex performing 3  inflations up to 20 atmospheres for 30 seconds.  Final diagnostics were  then performed through guiding catheter.  The patient tolerated the  procedure well and left the laboratory in satisfactory condition.   RESULTS:  The left main coronary artery:  The left main coronary artery  is free of significant disease.   The left anterior descending artery:  The left anterior descending  artery was diffusely irregular.  This vessel gave rise to a first  diagonal branch and a moderate-sized septal perforator and then several  smaller diagonal branches and septal perforators.  There was less than  10% narrowing at the stent in the proximal portion of the diagonal  branch.  There was a 50% ostial stenosis in the diagonal branch.  There  was a 40% narrowing in the proximal LAD and 50% narrowing in the mid  LAD, and there was a 90% narrowing at the very distal portion of the LAD  near the apex.   The circumflex artery:  The circumflex artery was a dominant vessel that  gave rise to a large ramus branch, a small marginal branch, a second  small marginal branch, a large posterolateral branch, and a posterior  descending branch.  There was 80% narrowing in the proximal to  midportion of the vessel.  There was 90% narrowing within the stent in  the midportion of the vessel.  There was 80% narrowing in the mid-to-  distal vessel distal to the stent.  There was a 95% stenosis in the  proximal portion of posterolateral branch, and there was  complete  occlusion of the posterior descending branch.   The right coronary artery:  The right coronary artery is a nondominant  vessel that supplied 2 right ventricular branches.  These were diffusely  irregular and diseased.  There was a stent in the proximal right  coronary artery, had less than 10% narrowing.   The left ventriculogram:  The left ventriculogram performed in the RAO  projection showed good wall motion with minimal inferior wall  hypokinesis.  The estimated ejection fraction was 60%.   Following stenting of the lesion in the posterolateral branch of the  right coronary artery stenosis improved from 95% to 0%.   Following stenting of tandem lesions in the proximal and in the mid  (which was an in-stent restenotic lesion in the mid-to-distal circumflex  artery), the stenosis improved from 90% to 0% with 2 overlapping XIENCE  drug-eluting stent.   CONCLUSION:  1. Coronary artery disease status post prior percutaneous coronary      interventions with 40% proximal and 50% mid stenosis in the left      anterior descending and 90% stenosis in the very distal left      anterior descending with 80% ostial stenosis in the first diagonal      branch and less than 10% stenosis at the stent site in the first      diagonal branch, 80% proximal, 90% mid, and 80% mid-to-distal in-      stent restenosis in the atrioventricular circumflex artery with 95%      stenosis in the posterolateral branch of circumflex artery and      total occlusion of posterior descending branch and less than 10%      stenosis at the stent site in the nondominant right coronary artery      with mild inferior wall hypokinesis and estimated ejection fraction      of 55%.  2. Successful percutaneous coronary intervention of the lesion in the      posterolateral branch of the circumflex artery using a XIENCE drug-      eluting stent with improvement in center narrowing from 95% to 0%.  3. Successful  percutaneous coronary intervention of tandem lesions in      the midportion of the atrioventricular circumflex artery including      an in-stent restenotic lesion in the midportion of the vessel with      improvement in the stenosis from 90% to 0% using 2 overlapping  XIENCE drug-eluting stents.   DISPOSITION:  The patient returned to post angio room for further  observation.  We will plan long-term Plavix.      Bruce Elvera Lennox Juanda Chance, MD, [facility]  Electronically Signed     BRB/MEDQ  D:  03/09/2009  T:  03/10/2009  Job:  [id number]   cc:   Gerrit Friends. Dietrich Pates, MD, [facility]  Ernestina Penna, M.D.

## 2011-02-21 NOTE — Assessment & Plan Note (Signed)
OFFICE VISIT   [name], [name]  DOB:  November 27, 1955                                        June 01, 2010  CHART #:  [id number]   CURRENT PROBLEMS:  1. Status post coronary artery bypass graft x4, May 09, 2010, for      class III progressive angina with severe three-vessel disease.  2. Diabetes mellitus.  3. Hypertension.  4. Dyslipidemia.   PRESENT ILLNESS:  The patient is a very nice 55 year old diabetic,  nonsmoker, who was found to have severe progressive coronary disease at  cath by Dr. Gala Romney in May.  He had previous PCI.  He was referred for  surgery and underwent left IMA grafting to his LAD and vein grafts to  the diagonal, ramus intermediate, and the posterolateral branch of the  dominant circumflex on May 09, 2010.  His surgery was delayed somewhat  by poorly-controlled diabetes and severe hyperglycemia.  He has now done  well at home without recurrent angina and the incisions have healed  well.  His diabetes is under better control.  His blood sugar range  between 100 and 150 instead of greater than 300 when he was first  scheduled for surgery.  He has had no problems with CHF or fluid  retention and he is walking daily.  He is scheduled to start outpatient  cardiac rehab at [facility] next week.   CURRENT MEDICATIONS:  1. Metformin 1 g b.i.d.  2. Aspirin 325 mg daily.  3. Protonix 40 mg daily.  4. Pravastatin 40 mg daily.  5. Ranitidine 150 mg p.r.n.  6. Xanax p.r.n.  7. Oxycodone p.r.n. pain.  8. Metoprolol 25 mg b.i.d.  9. Lantus insulin b.i.d.  10.Vitamins.   PHYSICAL EXAMINATION:  Vital Signs:  Blood pressure 150/90, pulse 70,  respirations 18, and saturation on room air 96%.  General:  He is alert  and pleasant.  Lungs:  Breath sounds are clear and equal.  The sternum  is stable and well healed.  Cardiac:  Rhythm is regular without gallop,  murmur, or rub.  Extremities:  The right leg incision is healing well.  There is no peripheral edema.   DIAGNOSTIC TESTS:  A PA and lateral chest x-ray reveals stable  cardiomegaly from his hypertension.  The sternal wires are intact.  The  lungs are clear and there is no pleural effusion.   IMPRESSION AND PLAN:  The patient was told he could increase his  activity level, to driving and light lifting, but with a limitation of  lifting to 20 pounds until August 09, 2010.  He was given a return to  work note for July 09, 2010, and he will spend September at cardiac  rehab at [facility].  I provided him with a final prescription  for oxycodone for incisional pain.  We discussed some postoperative  depression and what to expect in terms of its resolution to his  situational depression.  He will return here as needed.   Kerin Perna, M.D.  Electronically Signed   PV/MEDQ  D:  06/01/2010  T:  06/02/2010  Job:  [id number]   cc:   Gerrit Friends. Dietrich Pates, MD, [facility]  Paulita Cradle, NP

## 2011-02-21 NOTE — Patient Instructions (Addendum)
Three polyps were removed from your colon today.  You will receive a letter in the mail with in 2-3 weeks with the results and also further colonoscopy recommendations   Dr. Juanda Chance also saw internal hemorrhoids which she recommends eating a high fiber diet to help with the hemorrhoids.  Informational handouts were given to your care partner.  You may resume your prior medications today.  Please call with any questions or concerns.  Dr. Juanda Chance sent a prescription for [facility] suppositories to your pharmacy.

## 2011-02-21 NOTE — Discharge Summary (Signed)
[name] [name]              ACCOUNT NO.:  [id number]   MEDICAL RECORD NO.:  [id number]          PATIENT TYPE:  INP   LOCATION:  2502                         FACILITY:  [facility]   PHYSICIAN:  [name] Beals. Juanda Chance, MD, FACCDATE OF BIRTH:  [birth year]   DATE OF ADMISSION:  03/08/2009  DATE OF DISCHARGE:  03/10/2009                               DISCHARGE SUMMARY   PRIMARY CARDIOLOGIST:  [name] Friends. Dietrich Pates, MD, [facility]   PRIMARY CARE [name] [name]:  [name] Pierini, FNP at [facility].   DISCHARGE DIAGNOSIS:  Non-ST-segment elevation myocardial infarction.   SECONDARY DIAGNOSES:  1. Coronary artery disease, status post previous percutaneous coronary      intervention of the right coronary artery, left circumflex and      first diagonal.  2. Diabetes mellitus.  3. Hypertension.  4. Hyperlipidemia.  5. Chronic obstructive pulmonary disease.  6. History of abnormal LFTs.   ALLERGIES:  No known drug allergies.   PROCEDURES:  Left heart cardiac catheterization performed on March 09, 2009, showing diffuse 80-90% stenosis throughout the left circumflex  including in-stent restenosis.  There is a 95% stenosis in the large  posterolateral branch.  There is a 90% stenosis in the very distal left  anterior descending but otherwise nonobstructive disease.  There is  nonobstructive disease within the previously placed right coronary  artery stent.  EF 55%.  The posterolateral branch was successfully  stented with a 2.5 x 15 mm XIENCE V drug-eluting stent.  The left  circumflex was stented with 2, 2.75 x 28 mm XIENCE drug-eluting stents.   HISTORY OF PRESENT ILLNESS:  A 55 year old Caucasian male with prior  history of CAD as above.  He was in his usual state of health until Mar 06, 2009, when he began to experience stuttering substernal chest  discomfort associated with shortness of breath and diaphoresis, lasting  2-5 minutes and resolving spontaneously.  He presented to  the [facility]  ED, on the evening of Mar 08, 2009, where he was noted to have ST  segment elevation during an episode of pain which subsequently resolved.  He was seen by [facility] and is noted to have elevated troponin  of 0.11.  The patient was admitted for further evaluation and management  of non-ST-elevation MI.   HOSPITAL COURSE:  [name] [name] eventually peaked his CK at 289, MB at 2.2,  and troponin I at 0.28.  He was placed on Integrilin, heparin, aspirin,  statin, and beta-blocker therapy and had no recurrent chest pain.  He  underwent left heart cardiac catheterization on March 09, 2009, showing a  95% stenosis in the left posterolateral branch as well as diffuse in-  stent restenosis within the left circumflex.  Both of these areas were  successfully stented with drug-eluting stents as outlined above.  His EF  was normal.  This morning, he has had no recurrent chest discomfort.  He  has been ambulating without difficulty and he has been counseling on the  importance of smoking cessation.  He will be discharged home today in  good condition.  DISCHARGE LABORATORY DATA:  Hemoglobin 14.5, hematocrit 41.5, WBC 8.7,  platelets 166, INR 1.0.  Sodium 138, potassium 3.7, chloride 102, CO2  28, BUN 12, creatinine 1.10, glucose 233, calcium 9.5, hemoglobin A1c of  11.2, CK 65, MB 2.2, troponin I 0.28, total cholesterol 163,  triglycerides 955, HDL 19, LDL not calculated.   DISPOSITION:  The patient will be discharged home today in good  condition.   FOLLOWUP PLANS AND APPOINTMENTS:  We will recommend a follow up with his  primary care [name] [name] for further evaluation of his diabetes and  hypertriglyceridemia.  We have arranged follow up with Dr. Dietrich [name] on  March 25, 2009, at 11:20 a.m.   DISCHARGE MEDICATIONS:  1. Aspirin 325 mg daily.  2. Plavix 75 mg daily.  3. Chantix 0.5 mg daily x3 days and b.i.d. x4 days, then 1 mg b.i.d.  4. Metformin 1000 mg daily to be resumed on  March 11, 2009.  5. Simvastatin 40 mg at bedtime.  6. Amlodipine 10 mg daily.  7. Metoprolol ER 50 mg daily.  8. Ranitidine 150 mg daily.  9. Multivitamin daily.  10.Diovan HCTZ 320/25 mg daily.  11.Xanax 0.5 mg b.i.d. p.r.n.  12.Nitroglycerin 0.4 mg sublingual p.r.n. chest pain.   OUTSTANDING LABORATORY STUDIES:  None.   DURATION OF DISCHARGE/ENCOUNTER:  Forty five minutes including physician  time.      [name] [name], ANP      [name] R. Juanda Chance, MD, [facility]  Electronically Signed    CB/MEDQ  D:  03/10/2009  T:  03/10/2009  Job:  [id number]   cc:   [name] Pierini, FNP

## 2011-02-21 NOTE — Cardiovascular Report (Signed)
NAMEJACORY, [name] NO.:  [id number]   MEDICAL RECORD NO.:  [id number]          PATIENT TYPE:  INP   LOCATION:  3733                         FACILITY:  [facility]   PHYSICIAN:  Rollene Rotunda, MD, FACCDATE OF BIRTH:  Aug 29, 1956   DATE OF PROCEDURE:  01/20/2008  DATE OF DISCHARGE:                            CARDIAC CATHETERIZATION   PRIMARY:  Bennie Pierini, NP.   CARDIOLOGIST:  Gerrit Friends. Dietrich Pates, MD, [facility].   PROCEDURE:  Left heart catheterization/ coronary arteriography.   INDICATIONS:  A patient with chest pain and multiple previous cardiac  interventions, including stenting to his circumflex and diagonal branch.   PROCEDURE NOTE:  Left heart catheterization performed via the right  femoral artery.  The artery was cannulated using anterior wall puncture.  A #6-French arterial sheath was inserted via the modified Seldinger  technique.  Preformed Judkins and pigtail catheter were utilized.  The  patient tolerated the procedure well and left the lab in stable  condition.   RESULTS:  Hemodynamics, LV 144/10, A1 148/86.  Coronaries, left main was  normal.  The LAD had diffuse 25% stenosis proximally.  It was mid-long  50% followed by another 50% stenosis.  There was apical 95% stenosis.  First diagonal was moderate-sized somewhat narrow caliber vessel.  It  was stented at the ostium and proximal segment.  There was diffuse in-  stent 25-30% restenosis.  There was mid 50% stenosis.  The circumflex  was a dominant vessel.  In the AV groove, there was proximal 25 and 30%  stenosis.  The was a mid stent with a long 70% stenosis in the stent.  This was progressed from the previous catheterization.  There was distal  40-50% stenosis before the PDA.  There was a large ramus intermediate  which was branching.  The superior branch had 40% stenosis.  The  inferior branch had long 25% stenosis.  There were 2 mid marginals which  were small with diffuse luminal  irregularities.  PDA was large with a  long proximal 50% stenosis and luminal irregularities throughout the  remainder of the vessel.  There was a very small posterolateral after  the PDA which had diffuse severe disease and TIMI I flow.  This was  unchanged from previous.  The right coronary artery was nondominant.  There was mild diffuse disease.  There was an RV branch with progressive  disease including a mid 99% stenosis with TIMI II flow.  The  left  ventriculogram obtained in the RAO projection.  The EF 65% with normal  wall motion.   CONCLUSION:  Coronary artery disease as described above.  He has small  vessel obstructive disease in the posterolaterals and RV branch.  This  could explain symptoms.  There is a questionably  significant lesion in  the previously stented circumflex AV groove.  The plan will be to  evaluate this with a stress perfusion study.  He needs aggressive risk  reduction including tobacco cessation.      Rollene Rotunda, MD, [facility]  Electronically Signed     JH/MEDQ  D:  01/20/2008  T:  01/20/2008  Job:  [id number]   cc:   Bennie Pierini, NP

## 2011-02-22 ENCOUNTER — Telehealth: Payer: Self-pay

## 2011-02-22 NOTE — Telephone Encounter (Signed)
No answer. No ID on answering machine. 

## 2011-02-24 NOTE — H&P (Signed)
NAME:  [name], [name] NO.:  [id number]   MEDICAL RECORD NO.:  [id number]                   PATIENT TYPE:  INP   LOCATION:  4733                                 FACILITY:  [facility]   PHYSICIAN:  Olga Millers, M.D.                DATE OF BIRTH:  Jul 22, 1956   DATE OF ADMISSION:  05/11/2003  DATE OF DISCHARGE:                                HISTORY & PHYSICAL   HISTORY:  [name] is a 55 year old patient of Dr. Marvel Plan, with past  medical history of coronary artery disease, hypertension, hyperlipidemia,  and gastroesophageal reflux disease, who was transferred from [facility] for  evaluation of chest pain.  The patient does have a prior history of  myocardial infarction and has had previous PCI in [place].  His most  recent catheterization was performed in March 2003.  At that time, he was  found to have a normal left main.  There was a 20-30% proximal LAD and a 40%  mid LAD.  There was a 50-60% proximal circumflex.  There was a 90%  nondominant right coronary artery.  His ejection fraction was 55-60% with  mild anterior hypokinesis.  The patient had PCI of his right coronary artery  at that time.  The patient has done well since then with no exertional chest  pain, dyspnea on exertion, orthopnea, PND, or pedal edema.  He does work at  [employer] in [employer].  This morning, at approximately 8:30 a.m., while  at [facility], he developed substernal chest pain that was  described as a tightness.  It radiated to the jaw.  There was no  associated nausea, vomiting, shortness of breath, or diaphoresis.  The pain  was not pleuritic or positional.  It was similar to his symptoms prior to  his previous interventions.  The pain lasted for approximately five minutes  and resolved spontaneously.  He subsequently stated that he felt somewhat  uneasy.  He also developed a headache.  He was seen in the [facility]  Emergency Room and was noted to have a  blood pressure of 215/111.  Apparently, he does not always take his Diovan or Toprol, according to his  wife.  His headache has improved at the time of transfer after Lopressor IV  5 mg x 1.  His blood pressure, on arrival, was 160/110.  On transfer to  [facility], he was pain free.   MEDICATIONS AT HOME:  Include aspirin, Toprol, Diovan and Prilosec.  He does  not know the doses.   ALLERGIES:  He has no known drug allergies.   SOCIAL HISTORY:  He does smoke, but he does not consume alcohol.   FAMILY HISTORY:  Positive for coronary artery disease in his mother and his  father.   PAST MEDICAL HISTORY:  1. Hypertension.  2. Hyperlipidemia.  3. There is no diabetes mellitus.  4. He has a  history of coronary artery disease, as outlined above.  5. He also has a history of gastroesophageal reflux disease and has had     prior esophageal dilatation.  6. He has also had a prior appendectomy.   REVIEW OF SYSTEMS:  He does complain of a headache at presentation.  There  are no fevers, chills or productive cough.  There is occasional mild  dysphagia, which has improved since his previous dilatation.  There is no  known hematochezia.  He does have reflux symptoms.  There is no dysuria or  hematuria.  There is no rash or seizure activity.  There is no orthopnea,  PND, or _______.  There is no claudication.  The remainder of systems are  negative.   PHYSICAL EXAMINATION:  VITAL SIGNS:  On arrival, shows a blood pressure of  160/110, and his pulse is 77.  GENERAL:  He is well developed and well nourished, in acute distress.  SKIN:  His skin is warm and dry.  HEENT;  Unremarkable with normal eyelids.  He does not appear to be  depressed and there is no peripheral clubbing.  NECK:  Supple with normal motion bilaterally, and there are no bruits noted.  There is no jugular venous distention, no thyromegaly noted.  CHEST:  Clear to auscultation with normal expansion.  CARDIOVASCULAR:   Reveals a regular rate and rhythm with normal S1 and S2.  There are no murmurs, rubs or gallops noted.  ABDOMEN:  Nontender, nondistended.  Positive bowel sounds.  No  hepatosplenomegaly.  No masses appreciated.  There is no abdominal bruit.  He has 2+ femoral pulses bilaterally and no bruits.  EXTREMITIES:  His extremities show no edema, and I can palpate no cords.  He  has 2+ dorsalis pedis pulses bilaterally.  NEUROLOGICAL:  Exam is grossly intact.   LABORATORY DATA:  His electrocardiogram shows a normal sinus rhythm.  There  is evidence of a prior septal infarct with no ST changes noted.  His initial  enzymes are negative.  A portable chest x-ray from [facility] shows no  edema.  His platelet count is 217.  His INR is 0.9.  His potassium is 3.5  with a BUN and creatinine of 14 and 1.5.   DIAGNOSES:  1. Chest pain, concerning for angina.  2. History of coronary artery disease.  3. Hypertension.  4. Hyperlipidemia.  5. Probable noncompliance.  6. Tobacco abuse.  7. History of gastroesophageal reflux disease.  8. Status post dilatation of esophageal stricture.   PLAN:  [name] presents with chest pain that is concerning for angina;  however, the pain lasted for only five minutes, and he has no  electrocardiographic changes.  His blood pressure was also elevated at the  time of his presentation, and he apparently did not take his medicines  today.  I wonder if his elevated blood pressure is contributing to his  symptoms.  We will admit and cycle enzymes.  We will continue with his  aspirin, Toprol and Diovan, and we will add heparin.  I also feel that he  needs to be on a statin long term, and we will add Zocor.  I have discussed  the options with Mr. Incorvaia, including catheterization versus nuclear study.  He would prefer to avoid catheterization if possible.  We will, therefore,  plan to proceed with a Cardiolite for risk stratification.  If this shows ischemia, then we will  proceed with cardiac catheterization.  We will make  further  recommendations once we have the above information.  Of note, the  patient does have a headache on arrival, which is much improved since [facility].  We will need to follow this closely.  I doubt any bleed at this  point, but if this headache worsens, then we can proceed with a CT of his  head.                                                Olga Millers, M.D.    BC/MEDQ  D:  05/11/2003  T:  05/12/2003  Job:  [id number]

## 2011-02-24 NOTE — Cardiovascular Report (Signed)
. [facility]  Patient:    [name], [name] Visit Number: [id number] MRN: [id number]          Service Type: MED Location: [facility] [id number] Attending Physician:  Rollene Rotunda Dictated by:   Jonelle Sidle, M.D. [facility] Proc. Date: 12/13/01 Admit Date:  12/12/2001   CC:         Rollene Rotunda, M.D. [facility]   Cardiac Catheterization  DATE OF BIRTH:  1956-10-01.  PRIMARY CARE PHYSICIAN:  Rollene Rotunda, M.D.  INDICATIONS:  [name] is a 55 year old male with hypertension and a history of coronary artery disease status post previous myocardial infarction on 2 occasions.  The patient reportedly underwent stent placement apparently involving 2 vessels at [facility] in the past.  He presents now with evidence of a non-ST-elevation myocardial infarction and has ongoing chest discomfort despite medical therapy.  He is referred urgently for cardiac catheterization to define the coronary anatomy and assess for revascularization options.  PROCEDURES PERFORMED: 1. Left heart catheterization. 2. Left ventriculography. 3. Aortic root angiography. 4. Selective coronary angiography.  ACCESS AND EQUIPMENT:  The area about the right femoral artery was anesthetized with 1% lidocaine and a #6 French sheath was placed in the right femoral artery via the modified Seldinger technique.  Standard preformed #6 [place] JL-4 and JR-4 catheters were used for selective coronary angiography. A standard #6 French angled pigtail catheter was used for left heart catheterization, left ventriculography, and aortic root angiography.  The patient tolerated the procedure well without obvious complications.  HEMODYNAMICS: 1. Left ventricle 162/25 mmHg (post angiography). 2. Aorta 162/94 mmHg.  ANGIOGRAPHIC FINDINGS: 1. The left main coronary artery is free of significant flow-limiting coronary    atherosclerosis.  2. The left anterior descending is a medium  caliber vessel that provides a    large diagonal branch.  There is 20-30% stenosis involving the proximal    LAD.  There is a stent visualized within the diagonal branch that is    patent.  There is also 40% stenosis of the mid LAD with minor luminal    irregularities more distally.  Coronary flow was TIMI-3 throughout this    system.  3. The circumflex coronary artery is a large dominant vessel that provides 3    obtuse marginal branches and a large posterior descending branch.  There is    a 50-60% stenosis in the proximal vessel.  There is a stent visualized at    the mid vessel segment that is patent.  4. There is a large bifurcating ramus intermedius branch with minor luminal    irregularities.  5. The right coronary artery is nondominant.  There is a 90% stenosis    proximally that appears to be the culprit lesion.  LEFT VENTRICULOGRAM:  Left ventriculography reveals an ejection fraction of 55-60% with mild anterior hypokinesis.  Trace mitral regurgitation is noted.  DIAGNOSES: 1. A 90% stenosis involving the proximal segment of a nondominant right    coronary artery.  This appears to be the culprit lesion. 2. Patent stent site in the mid circumflex coronary artery. 3. Patent stent site in the diagonal branch of the left anterior descending    coronary artery. 4. Mild to moderate coronary atherosclerosis as outlined above. 5. Left ventricular ejection fraction of 55-60% with mild anterior hypokinesis    and trace mitral regurgitation.  PLAN:  Discussed films with Dr. Juanda Chance.  Will plan to address the right coronary artery with percutaneous coronary intervention.  Dictated by:   Jonelle Sidle, M.D. [facility] Attending Physician:  Rollene Rotunda DD:  12/13/01 TD:  12/13/01 Job: [id number]

## 2011-02-24 NOTE — Discharge Summary (Signed)
NAMEJASSEN, SARVER NO.:  [id number]   MEDICAL RECORD NO.:  [id number]          PATIENT TYPE:  INP   LOCATION:  3709                         FACILITY:  [facility]   PHYSICIAN:  Pricilla Riffle, M.D.    DATE OF BIRTH:  16-Aug-1956   DATE OF ADMISSION:  02/16/2005  DATE OF DISCHARGE:  02/18/2005                                 DISCHARGE SUMMARY   PROCEDURES:  1.  Cardiac catheterization.  2.  Coronary arteriogram.  3.  Left ventriculogram.   DISCHARGE DIAGNOSES:  1.  Chest pain, cardiac enzymes negative for MI, catheterization with      moderate coronary artery disease, symptoms felt secondary to      pericarditis.  2.  Status post percutaneous transluminal coronary angioplasty and stent to      the right coronary artery  in 2003.  3.  Status post percutaneous transluminal coronary angioplasty and stent to      the circumflex.  4.  Hypertension.  5.  Erectile dysfunction.  6.  Dyslipidemia.  7.  Tobacco use  8.  Family history of coronary artery disease.  9.  ACE intolerance, secondary to cough  10. Diabetes, new diagnosis.   HOSPITAL COURSE:  Mr. Mehra is a 55 year old male with known coronary  artery disease. He developed shoulder pain that he thought was bursitis. His  symptoms worsened and he came to the emergency room. His EKG had ST-segment  changes in the inferior leads, and he was taken urgently to the  catheterization  lab.   Mr. Stmartin had no obstruction in the no significant obstruction in the RCA.  He had a 99% inferolateral PDA that was a less than 1 mm vessel. He also had  a new 90% OM lesion and 70% circumflex lesion, which had TIMI-III flow. His  aortic root was normal.  Dr. Riley Kill felt consideration could be given to  percutaneous intervention of the circumflex OM at a later date.   Mr. Delamater' EKG changes resolved and his chest pain resolved. His cardiac  enzymes remained negative. His BNP was less than 30. His TSH was within  normal  limits as well. A lipid profile was performed, which showed total  cholesterol 192, triglycerides 406, HDL 24, LDL not calculated because of  triglycerides greater than 400. He had [address] added to his medication  regimen, and he is to follow up with fasting lipid profile and liver  function testing in 6-12 weeks. A hemoglobin A1c was performed because of  hyperglycemia. It came back significantly elevated at 9.3. He is to be  referred to outpatient diabetes education and was given prescriptions for  Glucometer for b.i.d. CBG's and started on metformin at 500 mg b.i.d..   Mr. Pinzon was a smoker when he came to the hospital, but he is interested  in quitting. He was started on Wellbutrin and a nicotine patch.   By Feb 18, 2005, Mr. Baranowski was ambulating without chest pain or shortness  of breath. He was evaluated and considered stable for discharge.  He is to  follow up as an outpatient with  cardiology and primary care.   DISCHARGE INSTRUCTIONS:  His activity level is to include limited activity  for one week. He is to call the office for problems with the catheterization  site. He is to follow up with his primary care physician for his diabetes  approximately 2 weeks. He is to follow up with Dr. Dietrich Pates.   DISCHARGE MEDICATIONS:  1.  Toprol XL 50 mg q.d.  2.  Diovan HCT 160/12.5 mg q.d.  3.  Norvasc 10 mg q.d.  4.  Aspirin 325 mg q.d.  5.  Metformin 500 mg b.i.d.  6.  Zocor 40 mg q.d.  7.  Wellbutrin 150 mg b.i.d.  8.  Nicotine patch step down from 21 to 14 to 7 mg.  9.  Nitroglycerin sublingual p.r.n..      RB/MEDQ  D:  02/18/2005  T:  02/18/2005  Job:  [id number]

## 2011-02-24 NOTE — H&P (Signed)
NAMEDEVONTE, MIGUES NO.:  [id number]   MEDICAL RECORD NO.:  [id number]          PATIENT TYPE:  INP   LOCATION:  1827                         FACILITY:  [facility]   PHYSICIAN:  Pricilla Riffle, M.D.    DATE OF BIRTH:  09-22-1956   DATE OF ADMISSION:  02/16/2005  DATE OF DISCHARGE:                                HISTORY & PHYSICAL   Mr. Chandler is a 55 year old gentleman with a history of coronary artery  disease. He was doing okay until this week, Monday, when he developed right  shoulder pain, thought it was bursitis. Tuesday he felt okay. Wednesday he  had left shoulder pain, intermittent. Today, he developed bilateral shoulder  pains going  across his back. Also developed chest pressure. It started in  the morning, waxed and waned in his chest. At noon he developed severe chest  pain, 8/10 in intensity. The doctor's office was called, so he went to [employer] and had lunch, waited for the office to open, and went to the  [facility] at 2 p.m. EKG there showed ST-elevation  inferiorly and was transferred to [facility]. The patient notes that  prior to this week he had been doing okay. He denied chest pain.   ALLERGIES:  None. Notes ACE intolerance secondary to coughing.   PAST MEDICAL HISTORY:  1.  Coronary artery disease, status post PTCA stent last in March 2003. Had      mild irregularities in the LAD. The left circumflex had 50% to 60%      stenosis proximally. The mid vessel had a stent that was patent. The RCA      had a 90% lesion in the proximal area that was stented from 90% to 10%.  2.  Hypertension.  3.  Impotence.  4.  History of dyslipidemia. Had been on statins in the past, none now.  5.  History of tobacco use, continued.  6.  History of reported retroperitoneal mass. CT in 2003 with follow-up MRI      recommended, have not found this yet.   SOCIAL HISTORY:  The patient smokes, quit, transiently is back to 3/4 pack  per day. Does  not drink.   FAMILY HISTORY:  Positive for CAD in the father.   REVIEW OF SYSTEMS:  All systems reviewed and negative to the above problems  except as noted.   PHYSICAL EXAMINATION:  GENERAL: On exam, the patient is in mild distress  secondary to chest pain which is 3/10 (in the cath lab down to 0/10 after 5  mg of metoprolol IV).  VITAL SIGNS: Blood pressure in the ER 159 systolic, pulse 100, temperature  98.8.  NECK: JVP is normal.  HEENT: Normocephalic and atraumatic. EOMI.  LUNGS: Clear.  CARDIAC: Regular rate and rhythm. S1 and S2. No S3, S4, or murmurs noted.  ABDOMEN: Supple. No hepatomegaly.  EXTREMITIES: Good distal pulses throughout. No lower extremity edema.   Labs and chest x-ray are pending.   EKG shows sinus tachycardia at a rate of 113 beats per minute. ST elevation  in leads II, III,  and IV with Q wave in III. These changes are new since  2003.   IMPRESSION:  The patient is a 55 year old gentleman with known CAD, now with  chest pain constant since noon. He has received 5 mg of Lopressor and the  pain appears to be gone. On review of his EKG there is evidence of injury  pattern. The risks, benefits of cardiac catheterization were explained and  the patient agrees. We will go ahead and plan. The patient has received four  adult aspirin. The patient is being randomized per protocol. Will check  lipids in the a.m. With history of hypertension, we will follow. We will  need to get old x-ray reports regarding mass.      PVR/MEDQ  D:  02/16/2005  T:  02/16/2005  Job:  [id number]

## 2011-02-24 NOTE — Discharge Summary (Signed)
   NAME:  [name], [name]                        ACCOUNT NO.:  [id number]   MEDICAL RECORD NO.:  [id number]                   PATIENT TYPE:  INP   LOCATION:  4733                                 FACILITY:  [facility]   PHYSICIAN:  Gene Serpe, P.A. [facility]                DATE OF BIRTH:  03/18/1956   DATE OF ADMISSION:  05/11/2003  DATE OF DISCHARGE:  05/13/2003                           DISCHARGE SUMMARY - REFERRING   PLEASE NOTE THE FOLLOWING ADDENDUM:  The patient had been originally scheduled for a discharge on May 13, 2003,  but was kept for overnight observation secondary to a persistent,  uncontrolled hypertension.  He required additional adjustment of his  medication regimen with the addition of Norvasc, which was new, 10 mg daily.  He was also started on Clonidine on the morning of discharge.  The patient  also had complaints of headaches associated with nitroglycerin, but this  resolved off nitroglycerin.  He was referred for a CT scan of the head,  however, this was negative.  At the time of discharge the patient's blood  pressure had improved to a level of 170/99.  He will present for a follow up  blood pressure check in our office the day following discharge, as had been  previously recommended.   DISCHARGE MEDICATIONS:  Were adjustments notable for increased Norvasc from  5 to 10 daily, and addition of Clonidine 0.1 b.i.d.  Other discharge  medications as previously noted.                                                  Gene Serpe, P.A. [facility]    GS/MEDQ  D:  05/13/2003  T:  05/14/2003  Job:  [id number]

## 2011-02-24 NOTE — H&P (Signed)
NAMEELRIDGE, STEMM NO.:  [id number]   MEDICAL RECORD NO.:  [id number]          PATIENT TYPE:  EMS   LOCATION:  MAJO                         FACILITY:  [facility]   PHYSICIAN:  Charlton Haws, M.D.     DATE OF BIRTH:  1955/10/23   DATE OF ADMISSION:  06/27/2005  DATE OF DISCHARGE:                                HISTORY & PHYSICAL   PRIMARY CARDIOLOGIST:  Dr. Donnamarie Rossetti   PRIMARY CARE PHYSICIAN:  Patient is followed by Birdena Jubilee at [facility]   CHIEF COMPLAINT:  Chest pain.   Mr. 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  He has a known history of coronary  artery disease who presented this morning with complaints of chest  discomfort.  Please note on Sunday patient had moved approximately 100 pound  object and felt a grabbing under his right rib cage.  He was actually going  to [facility] today to get seen for this.  He states  he felt a pop under his right rib cage when this occurred.  However, this  a.m. he was awakened around 3:30 a.m. with mid sternal pressure he rated  around an 8 at that time associated with shortness of breath and nausea.  Denied any diaphoresis or lightheadedness.  He took three nitroglycerin over  a period of 20 minutes without any relief and then took an additional two  tablets without any change in pain.  He went ahead and got dressed and came  to [facility] for further evaluation.  Currently he states deep breathing  makes it worse.  He is rating his pain around a 4 to 5 at this time.  He is  restless, complaining of the mid sternal discomfort.  He also was recently  treated for bronchitis and treated a round of antibiotics one week ago.   ALLERGIES:  No known drug allergies.   MEDICATIONS:  1.  Aspirin 325 mg daily.  2.  Metformin 500 mg p.o. b.i.d.  3.  Toprol XL 50 daily.  4.  Diovan 160/12.5 daily.  5.   Norvasc 10 daily.  6.  Zocor 40 daily.   PAST MEDICAL HISTORY:  Coronary artery disease.  Patient's last cardiac  catheterization was in May of this year by Dr. Riley Kill.  At that time  patient had well preserved overall left ventricular systolic function.  No  evidence of aortic dissection.  He had progressive lesion in the superior  subbranch of the obtuse marginal and continued patency at all three stent  sites.  Dr. Riley Kill felt consideration should be given to possible  percutaneous intervention of the circumflex obtuse marginal at some point in  the future.  Patient continues to have problems.  Patient's last stent was  placed in 2003 to the circumflex.  Patient also has prior stenting of the  diagonal in the proximal right coronary.  Patient has had two known  myocardial infarctions, hypertension, diabetes which was just diagnosed in  May of this year, dyslipidemia, tobacco abuse, history  of retroperitoneal  mass by CT scan in 2003.   SOCIAL HISTORY:  He lives in [place] with his wife.  He is employed in  the maintenance department at [employer].  He has three children.  He quit using tobacco in May of 2006.  Denies any ETOH use.  Tries to follow  a low sugar diet.   FAMILY HISTORY:  Mother:  No known coronary artery disease.  Father deceased  at age 24 from CHF.  He has one brother who is deceased from cancer.   REVIEW OF SYSTEMS:  Positive for fever, chills, chest pain, shortness of  breath, nausea.  All other systems negative per patient.   PHYSICAL EXAMINATION:  VITAL SIGNS:  Temperature 99, pulse 97, respirations  initially 27, currently 16, blood pressure initially 152/87, currently  106/63.  GENERAL:  Patient is somewhat restless.  HEENT:  Pupils are equal, round, and reactive to light.  His sclera is  clear.  NECK:  Supple without lymphadenopathy.  Negative bruit or JVD.  CARDIOVASCULAR:  Heart rhythm regular, slightly tachy.  S1 and S2.  Pulses  are 2+  and equal without bruits.  LUNGS:  He has rhonchi in his right lower lobe.  SKIN:  Warm and dry.  ABDOMEN:  Soft.  He has pain in his right upper quadrant under the rib cage.  EXTREMITIES:  No clubbing, cyanosis, edema.  He has 2+ DPs bilateral.  NEUROLOGIC:  He is alert and oriented x3.  Cranial nerves II-XII grossly  intact.   Chest x-ray showing no acute disease.  He has borderline heart size, linear  atelectasis versus scarring in his lung bases.  EKG without acute changes  showing an old inferior MI.   LABORATORIES:  [facility] laboratories:  Hemoglobin 14.6, hematocrit 43.  Sodium  137, potassium 3.9, BUN 13, creatinine 1.1 with a glucose of 178.  Point of  care markers:  Troponin less than 0.05 x2.  BNP less than 30.   Dr. Maurine Cane in to examine and assess patient.  A 55 year old, known  coronary artery disease, recurrent chest discomfort, questionable OM PTCA by  Dr. Riley Kill.  Reevaluate substernal chest pain worse with right upper lobe  rib discomfort, questionable muscular strain secondary to injury on Sunday.  EKG with old inferior MI, no acute changes.  Chest x-ray:  Scar in bilateral  bases.  Point of care enzymes negative.  Extremely uncomfortable pain to  palpation in his right upper quadrant.   PLAN:  We will admit patient to telemetry, rule out MI.  CT of the chest.  Gallbladder ultrasound.  Catheterization in a.m. if enzymes are negative.      Dorian Pod, NP    ______________________________  Charlton Haws, M.D.    MB/MEDQ  D:  06/27/2005  T:  06/27/2005  Job:  [id number]

## 2011-02-24 NOTE — Op Note (Signed)
NAME:  [name], [name]                        ACCOUNT NO.:  [id number]   MEDICAL RECORD NO.:  [id number]                   PATIENT TYPE:  AMB   LOCATION:  DAY                                  FACILITY:  [facility]   PHYSICIAN:  Lionel December, M.D.                 DATE OF BIRTH:  10/17/1955   DATE OF PROCEDURE:  10/13/2002  DATE OF DISCHARGE:                                 OPERATIVE REPORT   PROCEDURE:  Esophagogastroduodenoscopy with dilatation.   INDICATIONS:  Jemiah is a 55 year old Caucasian male who has typical  symptoms of GERD controlled with over-the-counter medications who also has  solid dysphagia.  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  It is felt that this has nothing to do with his  dysphagia.  He is undergoing a diagnostic and therapeutic procedure.  The  procedure risks were reviewed with the patient and informed consent was  obtained.   PREOPERATIVE MEDICATIONS:  Cetacaine spray for pharyngoesophageal  anesthesia; Demerol 50 mg IV, Versed 6 mg IV in divided doses.   INSTRUMENT:  Olympus video system.   FINDINGS:  The patient was performed in the endoscopy suite.  The patient's  vital signs and O2 saturations were monitored during the procedure and  remained stable.  The patient was placed in the left lateral position and  the endoscope was passed via the pharynx without difficulty into the  esophagus.   Esophagus:  Mucosa of the esophagus normal except for a linear erosion at  the distal esophagus and a ring at the GE junction.  There was extrinsic  compression at 28 cm from the incisors along the posterolateral wall on the  right side.  This was documented on multiple endoscopic pictures.  When the  esophagus was insufflated, this would completely go away and is felt to be  very soft.  There was also a small sliding hiatal hernia.   Stomach:  It  was empty and distended very well with insufflation.  The folds  in the proximal stomach were normal.  Examination of the mucosa revealed a  few prepyloric erosions.  The pyloric channel was patent.  Angularis of the  fundus was examined by retroflexing the scope and were normal.   Duodenum:  Examination of the bulb revealed multiple erosions with mucosal  edema.  Folds and mucosa in the second part of the duodenum were normal.  The endoscope was withdrawn.   The esophagus was dilated by passing a 54 [place] Maloney dilator through the  esophagus completely.  As the dilator was withdrawn, the endoscope was  passed again and the ring was noted to have been effectively disrupted.  Once again, pictures were taken for the record.  The endoscope was  withdrawn.  The patient tolerated the procedure well.  FINAL DIAGNOSES:  1. Extrinsic compression in mid esophagus corresponding to computed     tomography finding.  This does not have anything to do with patient's     dysphagia.  2. Erosive reflux esophagitis with distal esophageal ring above small     sliding hiatal hernia.  Esophagus dilated and this was effectively     disrupted.  3. Erosive antral gastritis and erosive bulbar duodenitis.   RECOMMENDATIONS:  1. He will continue antireflux measures.  2. We will ask him to stop his Zantac and we will start him on Prilosec 20     mg p.o. q.a.m.  3. H. pylori serologies will be checked.  I will be contacting patient with     pending results.  4. We will plan to see him back in a couple of months to make sure his GERD     symptoms are well-controlled and his dysphagia has been resolved.  We are     also in the process of getting his lab studies to make sure that his ALT     and alkaline phosphatase are normal, otherwise these will have to be     further evaluated.                                               Lionel December, M.D.    NR/MEDQ  D:  10/13/2002  T:  10/13/2002  Job:   [id number]

## 2011-02-24 NOTE — Letter (Signed)
January 29, 2007    [name] Cradle, NP  [facility]  [phone]  [place], [place]  [address]   RE:  [name], [name]  MRN:  [id number]  /  DOB:  1956-05-31   Dear [name]:   [name] returns to the office for continued assessment and treatment  of cardiovascular disease and coronary risk factors.  Since the last  visit one year ago, he has done quite well.  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  Blood pressure control has been fairly good  with systolics in the 130-140 range and diastolics of approximately 85.  He continues to refrain from the use of tobacco products, now abstinent  for 5 years.  Lipids were excellent when I last checked in late 2006,  but he reports a recent increase in his dose of  simvastatin after a new  set was evaluated.  Diabetic control has apparently been suboptimal,  also prompting an increase in his dose of metformin.  He continues to  have erectile dysfunction.   CURRENT MEDICATIONS:  Are uncertain since the patient did not bring  these with him.   EXAMINATION:  [name], somewhat overweight gentleman in no acute  distress.  The weight is 195, unchanged.  Blood pressure 135/85, heart rate 82 and  regular.  Respirations 16.  NECK:  No jugular venous distention; normal carotid upstrokes without  bruits.  LUNGS:  Clear.  CARDIAC:  Normal 1st and 2nd heart sounds; minimal basilar systolic  ejection murmur.  ABDOMEN:  Soft, nontender; no organomegaly.  EXTREMITIES:  No edema.   IMPRESSION:  [name] is doing generally well.  A lower blood pressure  will be desirable.  As soon as we determine what he is currently taking,  we will adjust the dosage.  We have requested laboratory values from  your office and we will review them.  He was given a prescription for  both Viagra and Levitra, as he would like to try the latter.  We will  plan to see this nice gentleman again in 1 year.   ADDENDUM:  Laboratory values were reviewed.  Urine microalbumin is only  slightly elevated.  Treatment with Diovan is appropriate, as has already  been initiated.  His lipid profile is somewhat suboptimal, and the  triglycerides are high, and HDL is low.  This would be improved by  better diabetic control.  His alkaline phosphatase and GGT remained  high.  This has been present for some time.  You may wish to obtain an  imaging study of his liver and possibly refer him to a  gastroenterologist for further evaluation.    Sincerely,      [name] Friends. Dietrich Pates, MD, [facility]  Electronically Signed    RMR/MedQ  DD: 01/29/2007  DT: 01/29/2007  Job #: [id number]

## 2011-02-24 NOTE — Cardiovascular Report (Signed)
NAMEJAREE, [name] NO.:  [id number]   MEDICAL RECORD NO.:  [id number]          PATIENT TYPE:  INP   LOCATION:  2930                         FACILITY:  [facility]   PHYSICIAN:  Arturo Morton. Riley Kill, M.D. [facility] OF BIRTH:  [birth year]   DATE OF PROCEDURE:  02/16/2005  DATE OF DISCHARGE:                              CARDIAC CATHETERIZATION   INDICATIONS:  Mr. Schonberg is a 55 year old gentleman who presents with  ongoing shoulder discomfort. He saw Dr. Christell Constant. EKG suggested ST elevation in  the inferior leads. This is new from a previous EKG. He was brought to the  laboratory emergently and enrolled in the Horizon study. He was brought for  urgent cardiac catheterization. The patient has had prior stenting of the  diagonal, AV circumflex, and also the proximal right coronary. These  symptoms were different from when he has had previously.   PROCEDURE:  1.  Left heart catheterization.  2.  Selective coronary arteriography.  3.  Selective left ventriculography in both the RAO and LAO views.  4.  Aortic root aortography.   DESCRIPTION OF PROCEDURE:  The patient was brought to the catheterization  laboratory, prepped and draped in usual fashion. Through an anterior  puncture, the right femoral artery was easily entered. A 6-French sheath was  then placed. We then took views of the left and right coronaries in multiple  angiographic projections. Central aortic and left ventricular pressures were  measured with a pigtail. Ventriculography was performed in the RAO  projection. We then carefully compared the previous studies to the current  studies. EKG was repeated. There is clearly evidence of some ST elevation  noted in the inferior leads, but we were uncertain as to the source. There  was a new lesion in the obtuse marginal but there was TIMI III flow in this  area. In addition, there was distal PDA disease which is a tiny  insignificant branch and wall motion was completely  normal in the RAO view.  We raise the possibility that this could represent pericarditis and a  bedside echocardiogram was done to exclude pericardial effusion. Moreover,  we elected to do both an LAO ventriculogram as well as a root shot. The  first was to exclude wall motion in the inferolateral segment and the second  was to do a aortic root shot to exclude dissection. I reviewed the films  with my colleagues including Dr. Diona Browner and Dr. Dorethea Clan, and we did not  feel that the findings could be explained by the intermediate vessel. It was  my feeling at the completion of the procedure that it was possibly related  to the tiny circumflex derived the posterior descending branch which is  small and a diffusely diseased vessel which is not amenable to percutaneous  intervention. The other possibility would be an occult pericarditis. First  set of enzymes were negative. I did review the films with the patient's wife  as well.   HEMODYNAMIC DATA:  Central aortic pressure was 135/59, mean 102. Left  ventricular pressure was 130/8. There was no gradient on pullback across  aortic valve.  ANGIOGRAPHIC DATA:  1.  Ventriculography was performed in the RAO and LAO projections. There did      not appear to be a significant wall motion abnormality in either view.  2.  Aortic root was injected to exclude dissection. No evidence of      significant aortic dissection as noted.  3.  Left main coronary artery is a large-caliber vessel being at least 5 mm      and appears to be free of critical disease.  4.  The left anterior descending artery courses to the apex. The LAD is a      severely diseased vessel. There is multiple areas of luminal      irregularity throughout. The LAD demonstrates about a 50-70% area of      narrowing in the mid to distal vessel followed by a 90% stenosis just      prior to the apex. The distal apical portion supplies the distal portion      of the inferior wall.  5.  The  diagonal branches is a fairly large caliber vessel. This has about      50% narrowing in it. The stent site itself is open without significant      focal narrowing.  6.  There is a ramus intermedius vessel. There is an inferior branch that      has diffuse 40-50% narrowing segmentally in the proximal portion. There      is a superior branch that has a fairly focal 90% stenosis which is      progressed from the previous study. This is at best a 1.5 to 2 mm      vessel. The AV circumflex itself has about 50-60% area of narrowing at      the takeoff of a tiny marginal branch. Following this, the vessel opens      up and the stented areas visualized without significant restenosis. Just      distal to the stented areas, another area of about 50-60% narrowing and      then another area distally of 60-70% narrowing. There is another      marginal branch which is small in caliber and diffusely diseased. The      posterior vessel consists of two fairly large posterolateral branches;      the second of which is much larger and both of these have luminal      irregularity but are free of critical disease. Finally, the vessel      terminates as a tiny posterior descending artery that in caliber would      be less than a mm in size. This vessel and is very diffusely diseased      with slow filling in the antegrade area. This potentially could explain      the electrocardiographic findings without explaining the wall motion      abnormality.   CONCLUSION:  1.  Well-preserved overall left ventricular systolic function in both the      RAO and LAO views.  2.  No evidence of aortic dissection.  3.  Progressive lesion in the superior subbranch of the obtuse marginal.  4.  Continued patency at all three stent sites.  5.  Abnormal electrocardiogram possibly explained by the inferior posterior     descending artery vessel which is severely diseased and small in      caliber.   RECOMMENDATIONS:  I am not  sure as to the best option at this point in time.  We will keep the patient sheath and get serial enzymes. We may eventually  bring him back to the laboratory and he will need an intervention of the  intermediate vessel.      TDS/MEDQ  D:  02/16/2005  T:  02/16/2005  Job:  [id number]   cc:   Ernestina Penna, M.D.  [address] [place]  [place] [address]  Fax: [phone]   Pricilla Riffle, M.D.   [facility]   Patient's medical record   Ventura Bing, M.D.

## 2011-02-24 NOTE — Procedures (Signed)
   NAMECHAPIN, ARDUINI                        ACCOUNT NO.:  [id number]   MEDICAL RECORD NO.:  [id number]                   PATIENT TYPE:   LOCATION:                                       FACILITY:   PHYSICIAN:  Edward L. Juanetta Gosling, M.D.             DATE OF BIRTH:  06-Feb-1956   DATE OF PROCEDURE:  DATE OF DISCHARGE:                                EKG INTERPRETATION   TIME AND DATE:  1709 on 05/11/2003   INTERPRETATION:  The rhythm is sinus rhythm with a rate of about 101.  Q  waves are seen in V1, 2, and 3 which may indicate a previous anteroseptal  myocardial infarction.  There is probable left atrial enlargement.  There is  an interventricular conduction delay.   IMPRESSION:  Abnormal electrocardiogram.                                               Edward L. Juanetta Gosling, M.D.    Gwenlyn Found  D:  05/12/2003  T:  05/12/2003  Job:  [id number]

## 2011-02-24 NOTE — Discharge Summary (Signed)
NAME:  [name], [name] NO.:  [id number]   MEDICAL RECORD NO.:  [id number]                   PATIENT TYPE:  INP   LOCATION:                                       FACILITY:  [facility]   PHYSICIAN:  Olga Millers, M.D.                DATE OF BIRTH:  01-12-1956   DATE OF ADMISSION:  05/11/2003  DATE OF DISCHARGE:  05/12/2003                                 DISCHARGE SUMMARY   DISCHARGE DIAGNOSES:  1. Hypertensive urgency.  2. Chest pain, etiology unclear.     a. Stress Cardiolite negative for ischemia, ejection fraction 50%.  3. Coronary artery disease.     a. History of myocardial infarction treated with percutaneous coronary        interventions in [place] in 2003.  4. Untreated hypercholesterolemia.  5. Gastroesophageal reflux disease.  6. Hemoglobin A1c.   HOSPITAL COURSE:  Please see the dictated admission history and physical by  Dr. Olga Millers on May 11, 2003 for complete details.  Briefly, this 55-  year-old male with a history of coronary artery disease was transferred from  [facility] with complaints of substernal chest tightness similar to  previous chest pain prior to percutaneous coronary interventions.  The  patient is non-compliant with his medications.  His blood pressure was  significantly elevated at 215/111 at [facility].  He was  transferred to [facility] for further evaluation and care.  He  preferred not to undergo catheterization.  Therefore, enzymes were cycled  and these were negative for myocardial infarction.  He was set up for a  stress Cardiolite.  This was done on May 12, 2003.  The patient exercised  to a stage 3 Bruce protocol.  He achieved a heart rate of 162.  Sinographic  images were negative for ischemia and EF was noted to be at 50%.  The  patient's blood pressure had been improved on the morning of May 12, 2003.  It was 150/76.  His IV nitroglycerin was weaned off.  At the time  of this  dictation it was noted that the patient's blood pressure was back up at  190/100.  He did not get his beta blocker today.  At the time of this  dictation he is currently receiving Toprol as well as Clonidine times one.  His medications have been adjusted to include Diovan, Toprol and Norvasc has  been added. If his blood pressure is significantly improved he will be  discharged this evening.  He will be asked to return to our office in  [place] tomorrow for a blood pressure check and he will be asked to see  Dr. Dietrich Pates soon in the next one to two weeks for early follow-up.  Zocor  was added to his medical regimen as he is not on any therapy for  hypercholesterolemia.   LABORATORY DATA:  White count 9,300, hemoglobin 14.0,  hematocrit 40.8,  platelet count 217,000.  INR 0.9.  Sodium 140, potassium 3.5, chloride 108,  C02 25, glucose 126, BUN 13, creatinine 1.3, calcium 8.9.  CK MB was  negative times four and Troponin I was negative times four.   DISCHARGE MEDICATIONS:  1. Diovan 160 mg daily.  2. Toprol XL 100 mg daily.  3. Norvasc 5 mg daily.  4. Enteric coated aspirin 325 mg daily.  5. Prilosec.  6. Nitroglycerin p.r.n. chest pain.  7. Zocor 20 mg q.h.s.    PAIN MANAGEMENT:  1. Tylenol as needed.  2. Nitroglycerin as needed for chest pain.  He is to call our office or 911     for recurrent chest pain.   ACTIVITY:  As tolerated.   DIET:  Low fat, low sodium.   DISPOSITION:  1. He will follow-up in [place] tomorrow for a blood pressure check with     the nurse at 1:00 p.m.  At the time of his blood pressure check a follow-     up appointment will be set up with Dr. Dietrich Pates in the next one to two     weeks for early follow-up.  2. He will need fasting lipids and LFTs done in the next six to eight weeks     to reassess his lipid status given the initiation of Zocor.   SPECIAL INSTRUCTIONS:  1. Non-compliance with his medications seems to be the major issue  here.  He     has been counseled on compliance with his medications.  2. Regarding his hyperglycemia, he has been urged to contact his primary     care physician and follow-up with them in the next couple of weeks.  He     sees Birdena Jubilee in [place], [place].      Tereso Newcomer, P.A.                        Olga Millers, M.D.    SW/MEDQ  D:  05/12/2003  T:  05/12/2003  Job:  [id number]   cc:   [place] Bing, M.D.   Birdena Jubilee  [facility]

## 2011-02-24 NOTE — H&P (Signed)
[place]  Patient:    [name], [name] Visit Number: [id number] MRN: [id number]          Service Type: MED Location: CCUA 2928 01 Attending Physician:  Rollene Rotunda Dictated by:   Arturo Morton. Riley Kill, M.D. [facility] Admit Date:  12/12/2001   CC:         Paulita Cradle, M.D. [place], [place]  Rollene Rotunda, M.D. [facility]   History and Physical  CHIEF COMPLAINT:  Chest pain.  HISTORY OF PRESENT ILLNESS:  The patient is a 55 year old white male with onset of severe chest pain approximately 9 p.m. tonight.  The pain was sharp and stabbing and radiated through to the back.  The pain was somewhat similar to what he had had with his two heart attacks in the past for which he had received stents at [facility] several years ago, however, he was not short of breath.  There were no definite EKG changes on electrocardiographic analysis.  The patients chest x-ray did not reveal a widened mediastinum but given the nature of the pain into the back, we elected to recommend urgent CT scanning to exclude aortic dissection.  PAST MEDICAL HISTORY: 1. The patient has had two prior myocardial infarctions. 2. Hypertension since 1993. 3. No evidence of diabetes mellitus. 4. Prior appendectomy.  ALLERGIES:  None.  MEDICATIONS: 1. Toprol-XL 50 mg one p.o. b.i.d. 2. Diovan HCT 160/12.5 mg one q.d. 3. Enteric-coated aspirin one daily.  FAMILY HISTORY:  Mother is alive at 23 and in good health.  Father died at 56 and was reported to have congestive heart failure.  The patient has four brothers, one of which has died from cancer.  He has one sister.  SOCIAL HISTORY:  The patient works at [employer].  He has three children but currently not married.  He still smokes.  REVIEW OF SYSTEMS:  The patients weight is stable.  There has been a history of gastroesophageal reflux.  Complete review of systems is otherwise negative.    PHYSICAL  EXAMINATION:  GENERAL:  Patient is an alert, oriented white male complaining of severe chest pain which was relieved after intravenous metoprolol.  VITAL SIGNS:  Blood pressure 171/105, pulse 79 after IV Lopressor, respirations 20, temperature afebrile -- 97.4.  NECK:  No jugular venous distention.  Pulses equal, right and left.  COR:  Regular.  ABDOMEN:  Soft.  EXTREMITIES:  Without edema.  NEUROLOGIC:  Nonfocal.  PERIPHERAL PULSES:  Pulses equal bilaterally.  LABORATORY AND ACCESSORY DATA:  Electrocardiogram:  Normal sinus rhythm, essentially within normal limits.  Hemoglobin 14.8, hematocrit 42.1, platelets 230,000.  BUN and creatinine within normal limits.  IMPRESSION: 1. Severe substernal chest pain, improved after intravenous metoprolol,    without electrocardiographic changes, rule out aortic dissection. 2. Severe hypertension. 3. Appendectomy.  PLAN: 1. CT scan. 2. Blood pressure control. 3. If CT negative, consider adding heparin and better blood pressure control    to regimen. 4. Possible cardiac catheterization. Dictated by:   Arturo Morton Riley Kill, M.D. [facility] Attending Physician:  Rollene Rotunda DD:  12/13/01 TD:  12/13/01 Job: [id number]

## 2011-02-24 NOTE — Consult Note (Signed)
[name], [name]                          ACCOUNT NO.:  [id number]   MEDICAL RECORD NO.:  [id number]                  PATIENT TYPE:   LOCATION:                                       FACILITY:   PHYSICIAN:  Lionel December, M.D.                 DATE OF BIRTH:  [birth year]   DATE OF CONSULTATION:  10/07/2002  DATE OF DISCHARGE:                                   CONSULTATION   REASON FOR CONSULTATION:  Dysphagia, questionable mediastinal mass on CT.   HISTORY OF PRESENT ILLNESS:  The patient is a 55 year old Caucasian male  patient of [place] [name], M.D. [facility] who presents today for further  evaluation of the above stated symptoms.  He complains of dysphagia to solid  foods since March of this year.  At times he feels like the food becomes  completely impacted and he is unable to even wash it down with liquids.  Eventually, it will go down on its own.  Denies any nausea, vomiting,  abdominal pain, constipation, diarrhea, melena.  He complains of occasional  rectal bleeding which he feels is from a hemorrhoid.  Denies any weight  loss.  He has chronic GERD, especially at nighttime and with dietary  indiscretions.  He takes Zantac 75 mg q.h.s. with good relief.   In March of this year he presented to [facility] with chest pain.  A CT was  done to rule out dissection or pulmonary embolism.  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  He had  scattered mesenteric and retroperitoneal lymph nodes which were small.  During that hospital stay he was found to have an MI per his report.  His  cardiac enzymes were elevated significantly.  He was told that he had no  heart damage, however.  He had already had cardiac stent placed in 1999.  He  underwent cardiac catheterization in March 2003 with placement of two more  stents.  Catheterization revealed 90% stenosis with  the proximal right  coronary artery.  Circumflex had 50-60% stenosis in the proximal segment  with a patent stent in the mid segment.  LAD had 20% proximal stenosis.  There was a stent in a diagonal branch that was patent.  There was 40%  stenosis in the mid LAD with minor luminal irregularities.  According to  medical records he underwent stenting of the right coronary artery.  He has  done well with no chest pain or shortness of breath since that time.   CURRENT MEDICATIONS:  1. Diovan 160/12.5 mg q.d.  2. Toprol 50 mg b.i.d.  3. Pravachol 40 mg q.d.  4. Plavix 75 mg q.d.  5. Aspirin 81 mg q.d.  6. Multivitamin q.d.  7. Vitamin C q.d.  8. Zantac 75 mg q.h.s.  ALLERGIES:  No known drug allergies.   PAST MEDICAL HISTORY:  1. Hypertension.  2. Hypercholesterolemia.  3. Coronary artery disease status post MI with three coronary artery stents,     last one placed in March 2003.  See above for details.  4. He has also had appendectomy.   FAMILY HISTORY:  Brother died of pancreatic cancer age 64.  No family  history of colorectal cancer.   SOCIAL HISTORY:  He is separated.  He has three children.  Employed with  [employer] in maintenance.  He quit smoking in March 2003, but had  smoked one pack per day for 15 years.  Denies any alcohol use.   REVIEW OF SYSTEMS:  Please see HPI for GI, for general, for cardiopulmonary.   PHYSICAL EXAMINATION:  VITAL SIGNS:  Weight 198.25, height 5 feet 6 inches,  temperature 98.9, blood pressure 130/90, pulse 64.  GENERAL:  Pleasant, well-nourished, well-developed Caucasian male in no  acute distress.  SKIN:  Warm and dry.  No jaundice.  HEENT:  Pupils are equal, round, and reactive to light.  Conjunctivae are  pink.  Sclerae nonicteric.  Oropharyngeal mucosa moist and pink.  No  lesions, erythema, or exudate.  NECK:  No lymphadenopathy, thyromegaly, carotid bruits.  LUNGS:  Clear to auscultation.  CARDIAC:  Regular rate and rhythm.   Normal S1, S2.  No murmurs, rubs, or  gallops.  ABDOMEN:  Positive bowel sounds.  Soft, nontender, nondistended.  No  organomegaly or masses.  EXTREMITIES:  No edema.   LABORATORIES:  From March 2003 he was noted to have slightly elevated SGPT  of 47 and alkaline phosphatase 172.  Total bilirubin was 0.7 and SGOT 34.  He tells me he is due for repeat blood work next week including liver tests.   IMPRESSION:  1. The patient is a pleasant 55 year old gentleman who presents with     progressive dysphagia, particularly to solid foods over the last nine     months.  He also has typical reflux symptoms which responded to over-the-     counter Zantac.  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  This has not been further evaluated as of yet.  2. Also had mildly elevated SGPT and alkaline phosphatase during     hospitalization at time of myocardial infarction.  3. Small volume occasional hematochezia.   PLAN:  1. Initially would recommend EGD with dilatation to further evaluate     dysphagia and rule out esophageal mass.  He will need to hold his Plavix     and aspirin for three days prior to procedure.  2. He will need a colonoscopy at some point in the near future to further     evaluate rectal bleeding.  3. Would recommend him to have follow-up LFTs through [place] [name], M.D.     [facility] office to verify the LFTs     have normalized.  If they remain elevated this should be further     evaluated.  4. He will continue Zantac 75 mg q.h.s. for now, but may consider PPI     therapy based on EGD findings.  5. Further recommendations to follow.     Tana Coast, P.A.  Lionel December, M.D.    LL/MEDQ  D:  10/07/2002  T:  10/07/2002  Job:  [id number]   cc:   [name], M.D. [facility]  [address]  [place]  [place] [address] Fax: 1   Lionel December, M.D.  [address]  [place]  [place] [address]  Fax: [phone]

## 2011-02-24 NOTE — Procedures (Signed)
[place]. [facility]  Patient:    [name], [name] Visit Number: [id number] MRN: [id number]          Service Type: MED Location: CCUA 2928 01 Attending Physician:  Rollene Rotunda Dictated by:   Everardo Beals Juanda Chance, M.D. [facility] Proc. Date: 12/13/01 Admit Date:  12/12/2001   CC:         Rollene Rotunda, M.D. [facility]  Benjamine Mola, M.D., [place], [place].  Cardiac Catheterization Lab   Procedure Report  CLINICAL HISTORY:  [name] is 55 years old and has had previous stents placed at [facility] in [place] to the circumflex and diagonal branches.  He came in with chest pain and positive troponins and was studied just prior to this by Dr. Diona Browner who found a 95% stenosis in a nondominant right coronary artery.  The stent in the circumflex artery and the diagonal branch to the LAD had no major obstruction.  There was 60% narrowing in the circumflex before the stent.  We made a decision to proceed with intervention on the nondominant right coronary artery.  DESCRIPTION OF PROCEDURE:  The patient was given weight-adjusted heparin to prolong an ACT of greater than 200 seconds and was given double bolus Integrilin and infusion.  We used a right bypass #6 [place] guiding catheter and a short BMW wire.  We crossed the lesion in the proximal portion of the right coronary artery with the wire without difficulty.  We first dilated with a 2.25- x 15-mm Quantum Maverick and performed 3 inflations up to 16 atmospheres for 42 seconds before we were able to expand the lesion.  There was recoil so we elected to stent the lesion with a 2.25- x 8-mm Pixel and we deployed this with 2 inflations of 12 and 16 atmospheres for 45 and 33 seconds.  Repeat diagnostic study was then performed through the guiding catheter.  The patient tolerated the procedure well and left the laboratory in satisfactory condition.  RESULTS:  Initially the stenosis in a nondominant right coronary  artery in the proximal portion was estimated at 95%.  Following stenting, this improved to less than 10%.  There was no dissection seen.  CONCLUSIONS:  Successful stenting of the proximal nondominant right coronary artery with improvement in percent area narrowing from 95% to less than 10%.  DISPOSITION:  The patient was returned to the post angioplasty unit for further observation.  ADDENDUM:  The patient had been on an Integrilin drip prior to the procedure and this was continued during the procedure. Dictated by:   Everardo Beals Juanda Chance, M.D. [facility] Attending Physician:  Rollene Rotunda DD:  12/13/01 TD:  12/13/01 Job: [id number]

## 2011-02-24 NOTE — Letter (Signed)
January 29, 2007     RE:  [name], [name]  MRN:  [id number]  /  DOB:  [birth year]   ADDENDUM:  Laboratory values were reviewed.  Urine microalbumin is only  slightly elevated.  Treatment with Diovan is appropriate, as has already  been initiated.  His lipid profile is somewhat suboptimal, and the  triglycerides are high, and HDL is low.  This would be improved by  better diabetic control.  His alkaline phosphatase and GGT remained  high.  This has been present for some time.  You may wish to obtain an  imaging study of his liver and possibly refer him to a  gastroenterologist for further evaluation.    Sincerely,      Gerrit Friends. Dietrich Pates, MD, [facility]    RMR/MedQ  DD: 01/29/2007  DT: 01/29/2007  Job #: [id number]

## 2011-02-24 NOTE — Discharge Summary (Signed)
[name], [name] NO.:  [id number]   MEDICAL RECORD NO.:  [id number]          PATIENT TYPE:  INP   LOCATION:  4729                         FACILITY:  [facility]   PHYSICIAN:  Pricilla Riffle, M.D.    DATE OF BIRTH:  Apr 27, 1956   DATE OF ADMISSION:  06/27/2005  DATE OF DISCHARGE:  06/29/2005                                 DISCHARGE SUMMARY   PRIMARY CARDIOLOGIST:  [place] Bing, M.D. [facility]   PRIMARY CARE PHYSICIAN:  Dr. Birdena Jubilee, [facility].   PRINCIPAL DIAGNOSIS:  Atypical chest pain.   OTHER DIAGNOSES:  1.  Coronary artery disease, status post stenting of the left circumflex,      right coronary artery, and diagonal.  2.  Hypertension.  3.  Hyperlipidemia.  4.  Type 2 diabetes mellitus.  5.  Remote tobacco abuse, quit May 2006.  6.  History of myocardial infarction times two.  7.  Gastroesophageal reflux disease.  8.  Erectile dysfunction.  9.  History of retroperitoneal mass.   ALLERGIES:  No known drug allergies.   PROCEDURE:  1.  Chest CT.  2.  Transesophageal echocardiogram.   HISTORY OF PRESENT ILLNESS:  A 55 year old Caucasian male with prior history  of CAD who this past Sunday moved an object that weighed approximately 100  pounds and at that time felt a grabbing sensation under his right rib cage.  He states that he felt a pop under his right rib cage when this occurred. At  approximately 3:30 a.m. on June 27, 2005, he awoke with mid sternal  pressure which he rated as 8/10 associated with shortness of breath and  nausea. There was no diaphoresis or lightheadedness. He took a total of five  sublingual nitroglycerin without any relief, and then he drove himself into  [facility] for further evaluation. In the ED he was noted to have  worsening chest pain with deep breathing or leaning forward. Notably, he was  recently treated for bronchitis a week prior to admission. The patient ruled  out for MI  and was admitted for further evaluation.   HOSPITAL COURSE:  The patient was seen by pulmonary and a CT of his chest  was performed revealing no evidence of pulmonary edema, bibasilar  collapse/consolidation was noted in the lingula. He was also noted to have  multiple right hilar lymphadenopathy as well as prominent lymph nodes in the  hydroduodenal ligament in the region of the GE junction. None of these were  substantially changed in 2003. The patient was placed on Unasyn therapy for  questionable aspiration pneumonia versus community-acquired pneumonia.  Included in the differential was esophageal stricture or spasm versus  irritable bowel syndrome and therefore GI was also. GI recommended replacing  his over-the-counter Zantac with PPI therapy, and without outpatient follow-  up.  From a cardiac standpoint, the patient was initially scheduled for  cardiac catheterization, however given the clearly pleuritic nature of his  chest discomfort, this was canceled. By September 20th he had no further  pleuritic chest pain or abdominal right upper quadrant discomfort, and as  a  result he is being discharged home today in satisfactory condition.   DISCHARGE LABORATORIES:  Hemoglobin 13.1, hematocrit 38.4, WBC 9.7, platelet  count 183,000. Sodium 137, potassium 3.9, chloride 104, CO2 27, BUN 12,  creatinine 1.2, glucose 120. Total bilirubin 0.4, alkaline phosphatase 133,  AST 17, ALT 24, total protein 5.8, albumin 3.1, calcium 9.2.  Cardiac  markers negative times four.  Total cholesterol 116, triglycerides 439, HDL  23.  LDL not calculated secondary to hypertriglyceridemia. TSH 1.012. Strep  pneumonia urine antigen was negative. Blood cultures were negative times  two.   DISPOSITION:  The patient is being discharged home in good condition.   FOLLOWUP PLANS AND APPOINTMENT:  He is asked to follow up with his primary  care physician, Dr. Birdena Jubilee, at [facility]  in  one to two weeks. He is to follow up with Dr. Dietrich Pates of [facility]  on October 2nd at 1 p.m. Arrangements will be made for follow-up with Dr.  Karilyn Cota for GI follow-up in [place].   DISCHARGE MEDICATIONS:  1.  Aspirin 325 mg daily.  2.  Toprol XL 25 mg daily.  3.  Diovan/HCT 160/12.5 mg daily.  4.  Zocor 40 mg q.h.s.  5.  Norvasc 10 mg daily.  6.  Metformin ER 500 mg b.i.d.  7.  Protonix 40 mg daily.  8.  Avelox 400 mg daily times five days.  9.  Nitroglycerin 0.4 mg sublingual p.r.n. chest pain.   OUTSTANDING LABORATORY STUDIES:  None.   DURATION OF DISCHARGE ENCOUNTER:  45 minutes including physician time.      Ok Anis, NP    ______________________________  Pricilla Riffle, M.D.    CRB/MEDQ  D:  06/29/2005  T:  06/29/2005  Job:  [id number]   cc:   Dr. Audery Amel, M.D.  [address]  [place]  [place] [address]   [place] Bing, M.D. [facility]  [address]  [address]  [place]  [place] [address]

## 2011-02-24 NOTE — Discharge Summary (Signed)
. [facility]  Patient:    [name], [name] Visit Number: [id number] MRN: [id number]          Service Type: MED Location: [phone] Attending Physician:  Rollene Rotunda Dictated by:   Guy Franco, P.A.-C. Admit Date:  12/12/2001 Discharge Date: 12/15/2001   CC:         Rollene Rotunda, M.D. [facility]  Dr. Bevelyn Buckles   Referring Physician Discharge [place]  DATE OF BIRTH:  02-11-1956  DISCHARGE DIAGNOSES: 1. Non-ST-segment elevation myocardial infarction. 2. Known coronary artery disease, status post PCI to the circumflex,    December 13, 2001, by Dr. Charlies Constable. 3. Hyperlipidemia, treated. 4. Hypertension, controlled. 5. Tobacco abuse.  HOSPITAL COURSE:  Mr. Halbert is a 55 year old male patient, with an onset of severe substernal chest pain, radiating to the back, starting around 9 p.m. on December 12, 2001.  Apparently he had two stents placed two years at [facility].  He states the pain upon presentation is similar to his previous MIs.  However, his EKG upon arrival to [facility] was normal. Because of a severe onset of chest pain, he did undergo a CT of the abdomen and chest with contrast.  This did not reveal any evidence of dissection or aortic abnormality.  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  The patients pain continued to progress despite IIb/IIIa being added to his regimen.  On the same day, the patient underwent heart catheterization with Dr. Simona Huh.  He was noted to have a normal left main, LAD with a 30% proximal lesion, patent stent in first diagonal, with a 40% mid LAD lesion. Circumflex with a 60% proximal lesion and a patent stent in the mid vessel. Right coronary artery was nondominant with a 90% proximal lesion.  With these findings, the patient then underwent PCI/stent placement to the right coronary artery  under the care of Dr. Charlies Constable.  The left ventriculogram revealed trace MR with an EF of 55-60%.  The patient tolerated these procedures well and was hospitalized over the next several days.  Laboratory studies during his hospitalization revealed maximum CK at 344 with an MB fraction of 48.1, troponin 4.29.  Sodium 137, potassium 4.5, BUN 15, creatinine 1.2.  Hemoglobin 14.8, hematocrit 42.1, platelets 230.  The patient was discharged home in stable condition on December 15, 2001.  DISCHARGE MEDICATIONS: 1. Toprol XL 50 mg 1 p.o. b.i.d. 2. Enteric-coated aspirin 325 mg 1 q.d. 3. Plavix 75 mg 1 p.o. q.d. 4. Diovan/HCTZ 160/12.5, 1 p.o. q.d. 5. Sublingual nitroglycerin p.r.n. chest pain.  DISCHARGE INSTRUCTIONS: 1. No strenuous activity until seen by Dr. Antoine Poche in clinic. 2. Remain on a low fat diet. 3. Clean over cath site with soap and water. 4. Call for any questions or concerns. 5. The patient is instructed not to work until after the clinic visit with    Dr. Antoine Poche.  The office will call with a time for this visit. Dictated by:   Guy Franco, P.A.-C. Attending Physician:  Rollene Rotunda DD:  12/26/01 TD:  12/26/01 Job: [id number]

## 2011-02-28 ENCOUNTER — Encounter: Payer: Self-pay | Admitting: Internal Medicine

## 2011-05-24 NOTE — Progress Notes (Signed)
Addended by: Maple Hudson on: 05/24/2011 09:20 AM   Modules accepted: Level of Service

## 2011-07-04 LAB — BASIC METABOLIC PANEL WITH GFR
BUN: 11
BUN: 15
CO2: 26
CO2: 26
Calcium: 9
Calcium: 9.4
Chloride: 101
Chloride: 104
Creatinine, Ser: 1.04
Creatinine, Ser: 1.13
GFR calc Af Amer: >60
GFR calc Af Amer: >60
GFR calc non Af Amer: >60
GFR calc non Af Amer: >60
Glucose, Bld: 206 — ABNORMAL HIGH
Glucose, Bld: 226 — ABNORMAL HIGH
Potassium: 3.3 — ABNORMAL LOW
Potassium: 3.8
Sodium: 137
Sodium: 137

## 2011-07-04 LAB — CBC
HCT: 39.3
HCT: 39.6
Hemoglobin: 13.2
Hemoglobin: 13.5
MCHC: 33.4
MCHC: 34.3
MCV: 83.3
MCV: 83.5
MCV: 84.1
Platelets: 191
Platelets: 200
Platelets: 207
RBC: 4.71
RBC: 4.72
RDW: 13.7
RDW: 14.4
WBC: 10.4
WBC: 10.5
WBC: 9.8

## 2011-07-04 LAB — LIPID PANEL
Cholesterol: 134
HDL: 28 — ABNORMAL LOW
LDL Cholesterol: 41
Total CHOL/HDL Ratio: 4.8
Triglycerides: 325 — ABNORMAL HIGH
VLDL: 65 — ABNORMAL HIGH

## 2011-07-04 LAB — APTT
aPTT: 28
aPTT: 28

## 2011-07-04 LAB — CARDIAC PANEL(CRET KIN+CKTOT+MB+TROPI)
CK, MB: 1.4
CK, MB: 1.6
Relative Index: INVALID
Relative Index: INVALID
Total CK: 33
Total CK: 35
Troponin I: 0.02
Troponin I: 0.03

## 2011-07-04 LAB — POCT CARDIAC MARKERS
CKMB, poc: <1 — ABNORMAL LOW
CKMB, poc: <1 — ABNORMAL LOW
CKMB, poc: <1 — ABNORMAL LOW
Myoglobin, poc: 57.6
Myoglobin, poc: 58.5
Operator id: 277751
Operator id: 277751
Troponin i, poc: 0.1 — ABNORMAL HIGH
Troponin i, poc: <0.05
Troponin i, poc: <0.05

## 2011-07-04 LAB — POCT I-STAT, CHEM 8
BUN: 15
Sodium: 139
TCO2: 27

## 2011-07-04 LAB — DIFFERENTIAL
Eosinophils Absolute: 0.2
Lymphocytes Relative: 16
Lymphs Abs: 1.6
Neutro Abs: 7.3
Neutrophils Relative %: 74

## 2011-07-04 LAB — PROTIME-INR
INR: 1
Prothrombin Time: 13.4

## 2011-07-04 LAB — HEMOGLOBIN A1C
Hgb A1c MFr Bld: 9.7 — ABNORMAL HIGH
Mean Plasma Glucose: 268

## 2011-09-08 ENCOUNTER — Ambulatory Visit: Payer: Managed Care, Other (non HMO) | Admitting: Cardiology

## 2011-09-12 ENCOUNTER — Encounter: Payer: Self-pay | Admitting: Cardiology

## 2011-09-14 ENCOUNTER — Ambulatory Visit (INDEPENDENT_AMBULATORY_CARE_PROVIDER_SITE_OTHER): Payer: Managed Care, Other (non HMO) | Admitting: Cardiology

## 2011-09-14 ENCOUNTER — Encounter: Payer: Self-pay | Admitting: Cardiology

## 2011-09-14 DIAGNOSIS — F17201 Nicotine dependence, unspecified, in remission: Secondary | ICD-10-CM | POA: Insufficient documentation

## 2011-09-14 DIAGNOSIS — I2510 Atherosclerotic heart disease of native coronary artery without angina pectoris: Secondary | ICD-10-CM | POA: Insufficient documentation

## 2011-09-14 DIAGNOSIS — I10 Essential (primary) hypertension: Secondary | ICD-10-CM

## 2011-09-14 DIAGNOSIS — F528 Other sexual dysfunction not due to a substance or known physiological condition: Secondary | ICD-10-CM

## 2011-09-14 DIAGNOSIS — N189 Chronic kidney disease, unspecified: Secondary | ICD-10-CM

## 2011-09-14 DIAGNOSIS — K219 Gastro-esophageal reflux disease without esophagitis: Secondary | ICD-10-CM

## 2011-09-14 DIAGNOSIS — E785 Hyperlipidemia, unspecified: Secondary | ICD-10-CM | POA: Insufficient documentation

## 2011-09-14 DIAGNOSIS — E119 Type 2 diabetes mellitus without complications: Secondary | ICD-10-CM

## 2011-09-14 MED ORDER — VALSARTAN-HYDROCHLOROTHIAZIDE 320-25 MG PO TABS: 1.0000 | ORAL_TABLET | Freq: Every day | ORAL | Status: DC

## 2011-09-14 MED ORDER — METOPROLOL TARTRATE 25 MG PO TABS: 25.0000 mg | ORAL_TABLET | Freq: Two times a day (BID) | ORAL | Status: DC

## 2011-09-14 MED ORDER — SILDENAFIL CITRATE 50 MG PO TABS: 50.0000 mg | ORAL_TABLET | Freq: Every day | ORAL | Status: AC | PRN

## 2011-09-14 MED ORDER — PRAVASTATIN SODIUM 40 MG PO TABS: 40.0000 mg | ORAL_TABLET | Freq: Every day | ORAL | Status: DC

## 2011-09-14 NOTE — Assessment & Plan Note (Signed)
Patient notes good CBGs at home.  Treatment of diabetes is managed by patient's PCP; annual visit anticipated in the near future.

## 2011-09-14 NOTE — Progress Notes (Signed)
Patient ID: [name], male   DOB: 11-24-1955, 55 y.o.   MRN: [id number] HPI: Return visit for this very pleasant [employer] employee, now 15 months following CABG surgery.  He has returned to his usual level of activity without any cardiopulmonary symptoms.  He occasionally notes fatigue when he exercises excessively, but walks nearly every day without difficulty.  Blood pressures are followed at home with systolics between 130 and 150 and diastolics in the mid 80s.  No recent laboratory studies are available.  Patient underwent colonoscopy earlier this year with 2 polyps excised and a recommendation for a repeat procedure in 5 years.  Prior to Admission medications   Medication Sig Start Date End Date Taking? Authorizing Provider  insulin glargine (LANTUS) 100 UNIT/ML injection Inject 50 Units into the skin at bedtime.     Yes Historical Provider, MD  metFORMIN (GLUCOPHAGE) 1000 MG tablet Take 1,000 mg by mouth 2 (two) times daily with a meal.     Yes Historical Provider, MD  metoprolol tartrate (LOPRESSOR) 25 MG tablet Take 25 mg by mouth 2 (two) times daily.     Yes Historical Provider, MD  Multiple Vitamins-Minerals (MULTIVITAMIN WITH MINERALS) tablet Take 1 tablet by mouth daily.     Yes Historical Provider, MD  pravastatin (PRAVACHOL) 40 MG tablet Take 40 mg by mouth daily.     Yes Historical Provider, MD  ranitidine (ZANTAC) 75 MG tablet Take 75 mg by mouth daily.     Yes Historical Provider, MD    Allergies  Allergen Reactions  . Ranexa (Ranolazine) Rash  . Wellbutrin (Bupropion Hcl) Rash      Past medical history, social history, and family history reviewed and updated.  ROS: Denies orthopnea, PND, exertional dyspnea, chest discomfort, palpitations, lightheadedness and syncope.  PHYSICAL EXAM: BP 170/96  Pulse 92  Ht 5\' 5"  (1.651 m)  Wt 88.451 kg (195 lb)  BMI 32.45 kg/m2  General-Well developed; no acute distress Body habitus-overweight Neck-No JVD; no carotid  bruits Lungs-clear lung fields; resonant to percussion Cardiovascular-normal PMI; normal S1 and S2; modest basilar systolic murmur Abdomen-normal bowel sounds; soft and non-tender without masses or organomegaly Musculoskeletal-No deformities, no cyanosis or clubbing Neurologic-Normal cranial nerves; symmetric strength and tone Skin-Warm, no significant lesions Extremities-distal pulses intact; no edema  ASSESSMENT AND PLAN:  Wamego Bing, MD 09/14/2011 3:42 PM

## 2011-09-14 NOTE — Assessment & Plan Note (Signed)
No recent lipid profile available; one will be drawn.

## 2011-09-14 NOTE — Assessment & Plan Note (Signed)
Rx for Viagra renewed.

## 2011-09-14 NOTE — Patient Instructions (Signed)
Your physician recommends that you schedule a follow-up appointment in: 12 months  Your physician has recommended you make the following change in your medication:  Aspirin 81 mg daily Increase diovan to 320/25 mg daily  Your physician recommends that you return for lab work in: 1 month

## 2011-09-14 NOTE — Assessment & Plan Note (Addendum)
Doing well now 15 months following uncomplicated CABG surgery.  No current symptoms to suggest recurrent myocardial ischemia.  Daily aspirin dose will be reduced to 81 mg.  Our focus will continue to be on optimal control of cardiovascular risk factors.

## 2011-09-14 NOTE — Assessment & Plan Note (Signed)
Blood pressure control slightly suboptimal.  Daily dose of hydrochlorothiazide will be increased to 25 mg per day.  Electrolytes and renal function will be monitored.

## 2011-09-14 NOTE — Assessment & Plan Note (Signed)
No recent laboratory studies.  A chemistry profile will be obtained.

## 2011-09-15 ENCOUNTER — Other Ambulatory Visit: Payer: Self-pay | Admitting: *Deleted

## 2011-09-15 ENCOUNTER — Encounter: Payer: Self-pay | Admitting: *Deleted

## 2011-09-15 DIAGNOSIS — E782 Mixed hyperlipidemia: Secondary | ICD-10-CM

## 2011-10-24 ENCOUNTER — Encounter: Payer: Self-pay | Admitting: *Deleted

## 2012-06-23 ENCOUNTER — Emergency Department (HOSPITAL_COMMUNITY)
Admission: EM | Admit: 2012-06-23 | Discharge: 2012-06-23 | Disposition: A | Discharge status: Home / Self Care | Payer: Managed Care, Other (non HMO) | Attending: Emergency Medicine | Admitting: Emergency Medicine

## 2012-06-23 ENCOUNTER — Encounter (HOSPITAL_COMMUNITY): Payer: Self-pay | Admitting: Emergency Medicine

## 2012-06-23 DIAGNOSIS — J449 Chronic obstructive pulmonary disease, unspecified: Secondary | ICD-10-CM | POA: Insufficient documentation

## 2012-06-23 DIAGNOSIS — Z79899 Other long term (current) drug therapy: Secondary | ICD-10-CM | POA: Insufficient documentation

## 2012-06-23 DIAGNOSIS — Z951 Presence of aortocoronary bypass graft: Secondary | ICD-10-CM | POA: Insufficient documentation

## 2012-06-23 DIAGNOSIS — I129 Hypertensive chronic kidney disease with stage 1 through stage 4 chronic kidney disease, or unspecified chronic kidney disease: Secondary | ICD-10-CM | POA: Insufficient documentation

## 2012-06-23 DIAGNOSIS — E785 Hyperlipidemia, unspecified: Secondary | ICD-10-CM | POA: Insufficient documentation

## 2012-06-23 DIAGNOSIS — J4489 Other specified chronic obstructive pulmonary disease: Secondary | ICD-10-CM | POA: Insufficient documentation

## 2012-06-23 DIAGNOSIS — Z87891 Personal history of nicotine dependence: Secondary | ICD-10-CM | POA: Insufficient documentation

## 2012-06-23 DIAGNOSIS — E119 Type 2 diabetes mellitus without complications: Secondary | ICD-10-CM | POA: Insufficient documentation

## 2012-06-23 DIAGNOSIS — Z794 Long term (current) use of insulin: Secondary | ICD-10-CM | POA: Insufficient documentation

## 2012-06-23 DIAGNOSIS — K219 Gastro-esophageal reflux disease without esophagitis: Secondary | ICD-10-CM | POA: Insufficient documentation

## 2012-06-23 DIAGNOSIS — N189 Chronic kidney disease, unspecified: Secondary | ICD-10-CM | POA: Insufficient documentation

## 2012-06-23 DIAGNOSIS — L509 Urticaria, unspecified: Secondary | ICD-10-CM

## 2012-06-23 MED ORDER — FAMOTIDINE 20 MG PO TABS: 20.0000 mg | ORAL_TABLET | Freq: Two times a day (BID) | ORAL | Status: DC

## 2012-06-23 MED ORDER — PREDNISONE 20 MG PO TABS
60.0000 mg | ORAL_TABLET | Freq: Once | ORAL | Status: AC
  Administered 2012-06-23: 60 mg via ORAL
  Filled 2012-06-23: qty 3

## 2012-06-23 MED ORDER — PREDNISONE 20 MG PO TABS: ORAL_TABLET | ORAL | Status: DC

## 2012-06-23 MED ORDER — FAMOTIDINE 20 MG PO TABS
20.0000 mg | ORAL_TABLET | Freq: Once | ORAL | Status: AC
  Administered 2012-06-23: 20 mg via ORAL
  Filled 2012-06-23: qty 1

## 2012-06-23 MED ORDER — HYDROXYZINE HCL 25 MG PO TABS: ORAL_TABLET | ORAL | Status: DC

## 2012-06-23 MED ORDER — HYDROXYZINE HCL 25 MG PO TABS
50.0000 mg | ORAL_TABLET | Freq: Once | ORAL | Status: AC
  Administered 2012-06-23: 50 mg via ORAL
  Filled 2012-06-23: qty 2

## 2012-06-23 NOTE — ED Provider Notes (Cosign Needed)
History  This chart was scribed for Ward Givens, MD by Erskine Emery. This patient was seen in room APA01/APA01 and the patient's care was started at 16:51.   CSN: [id number]  Arrival date & time 06/23/12  1629   First MD Initiated Contact with Patient 06/23/12 1651      Chief Complaint  Patient presents with  . Rash    (Consider location/radiation/quality/duration/timing/severity/associated sxs/prior treatment) The history is provided by the patient. No language interpreter was used.  [name] is a 55 y.o. male who presents to the Emergency Department complaining of a gradually worsening itching diffuse rash for the past 3-4 weeks. Pt reports some associated difficulty sleeping because of itching but denies any swelling in the lips or mouth, trouble swallowing, fevers, nausea, emesis, or diarrhea. Pt denies his wife has a rash and claims there is nothing new in the house, no new exposures at work or home except for a BP medication change. Pt took 2 benadryl last night that didn't relieve the itching. Pt reports he has been changing his blood pressure medication within the last several weeks; he was on metoprolol and Diovan but was switched off the Diovan to something new, that was again switched to Tribenzor when the rash first appeared. Pt has now been taking Tribenzor for the past 2-3 weeks. Pt has a h/o coronary artery bypass graft. Pt reports one episode of similar symptoms just before his bypass surgery, in reaction to prescribed Ranexa and/or Wellbutrin (his only known drug allergies). Pt is driving home. States ice helped his itching.   Dr. Catalina Pizza is the pt's PCP who has been monitoring his medication changes. He has an appt in 2 days  Past Medical History  Diagnosis Date  . Arteriosclerotic cardiovascular disease (ASCVD) 2003    Non-Q MI with stent to RCA in 12/2001, normal EF, additional stents to mid CX and D1; cath in 02/2005-progressive or eye disease, 50-70% mid LAD, 90%  distal LAD, 50% D1, 60-70% CX, small PDA with diffuse disease, patent RCA stent; repeat cath in 01/2008-no significant progression; non-Q MI in 02/2010 with 100% mid Cx at  prior stent, nondom. RCA with patent stent, 50% RI, nl EF, CABG-04/2010  . Diabetes mellitus     no insulin; patient recalls A1c of 6 in 2010  . Gastroesophageal reflux disease   . Hyperlipidemia     Predominantly elevated triglycerides  . Hypertension   . Anxiety   . Erectile dysfunction   . Chronic obstructive pulmonary disease   . Chronic kidney disease     Creatinine of 1.44 in 04/2009  . Tobacco abuse, in remission     35 pack years; discontinued 05/2010  . Abnormal LFTs   . Pneumonia 2006    2006  . Hx of colonic polyps 2012    polypectomy x2    Past Surgical History  Procedure Date  . Appendectomy   . Coronary artery bypass graft 05/2010    4 vessels August 2011  . Colonoscopy w/ polypectomy 2012    Dr. Juanda Chance; 2 polyps removed one of which was precancerous    Family History  Problem Relation Age of Onset  . Cirrhosis Mother     + Sister  . Heart disease Father   . Pancreatic cancer Brother     History  Substance Use Topics  . Smoking status: Former Smoker -- 1.0 packs/day for 30 years    Types: Cigarettes    Quit date: 02/06/2010  . Smokeless  tobacco: Never Used  . Alcohol Use: No   Pt quit smoking. He works here at the hospital.  Review of Systems  Constitutional: Negative for fever.  HENT: Negative for congestion, sore throat and trouble swallowing.   Eyes: Negative for discharge.  Respiratory: Negative for apnea, cough and shortness of breath.   Cardiovascular: Negative for chest pain.  Gastrointestinal: Negative for nausea, vomiting, abdominal pain and diarrhea.  Genitourinary: Negative for frequency and hematuria.  Musculoskeletal: Negative for back pain.  Skin: Positive for rash.  Neurological: Negative for seizures and headaches.  Hematological: Negative.     Psychiatric/Behavioral: Positive for disturbed wake/sleep cycle. Negative for hallucinations.  All other systems reviewed and are negative.    Allergies  Ranexa and Wellbutrin  Home Medications   Current Outpatient Rx  Name Route Sig Dispense Refill  . ASPIRIN 81 MG PO TABS Oral Take 81 mg by mouth daily.      . INSULIN GLARGINE 100 UNIT/ML [place] SOLN Subcutaneous Inject 50 Units into the skin at bedtime.      Marland Kitchen METFORMIN HCL 1000 MG PO TABS Oral Take 1,000 mg by mouth 2 (two) times daily with a meal.      . METOPROLOL TARTRATE 25 MG PO TABS Oral Take 1 tablet (25 mg total) by mouth 2 (two) times daily. 180 tablet 3  . MULTI-VITAMIN/MINERALS PO TABS Oral Take 1 tablet by mouth daily.      Marland Kitchen PRAVASTATIN SODIUM 40 MG PO TABS Oral Take 1 tablet (40 mg total) by mouth daily. 90 tablet 3  . RANITIDINE HCL 75 MG PO TABS Oral Take 75 mg by mouth daily.      Marland Kitchen VALSARTAN-HYDROCHLOROTHIAZIDE 320-25 MG PO TABS Oral Take 1 tablet by mouth daily. 90 tablet 3    Triage Vitals: BP 175/91  Pulse 115  Temp 98.2 F (36.8 C) (Oral)  Ht 5\' 1"  (1.549 m)  Wt 192 lb (87.091 kg)  BMI 36.28 kg/m2  SpO2 98%  Vital signs normal hypertension and tachycardia   Physical Exam  Nursing note and vitals reviewed. Constitutional: He is oriented to person, place, and time. He appears well-developed and well-nourished.  Non-toxic appearance. He does not appear ill. No distress.  HENT:  Head: Normocephalic and atraumatic.  Right Ear: External ear normal.  Left Ear: External ear normal.  Nose: Nose normal. No mucosal edema or rhinorrhea.  Mouth/Throat: Oropharynx is clear and moist and mucous membranes are normal. No dental abscesses or uvula swelling.  Eyes: Conjunctivae normal and EOM are normal. Pupils are equal, round, and reactive to light.  Neck: Normal range of motion and full passive range of motion without pain. Neck supple.  Cardiovascular: Normal rate, regular rhythm and normal heart sounds.  Exam  reveals no gallop and no friction rub.   No murmur heard. Pulmonary/Chest: Effort normal and breath sounds normal. No respiratory distress. He has no wheezes. He has no rhonchi. He has no rales. He exhibits no tenderness and no crepitus.       Lungs are clear to auscultation.  Abdominal: Soft. Normal appearance and bowel sounds are normal. He exhibits no distension. There is no tenderness. There is no rebound and no guarding.  Musculoskeletal: Normal range of motion. He exhibits no edema and no tenderness.       Moves all extremities well.   Neurological: He is alert and oriented to person, place, and time. He has normal strength. No cranial nerve deficit.  Skin: Skin is warm, dry and intact.  No rash noted. No erythema. No pallor.       Round red areas on both palms, consistent with urticaria. Forehead: smaller red raised lesions, some coalescing that also appear to be urticarial. On upper chest/shoudler area: large red lesions with slightly red raised, well demarkated borders, also consistent with [place]. No definite lesions on back or abdomen.  Psychiatric: He has a normal mood and affect. His speech is normal and behavior is normal. His mood appears not anxious.    ED Course  Procedures (including critical care time)   Medications  predniSONE (DELTASONE) tablet 60 mg (60 mg Oral Given 06/23/12 1716)  famotidine (PEPCID) tablet 20 mg (20 mg Oral Given 06/23/12 1716)  hydrOXYzine (ATARAX/VISTARIL) tablet 50 mg (50 mg Oral Given 06/23/12 1716)    DIAGNOSTIC STUDIES: Oxygen Saturation is 98% on room air, normal by my interpretation.    COORDINATION OF CARE: 17:00--I evaluated the patient and we discussed a treatment plan including medication to which the pt agreed. I informed the pt that heat will exacerbate the symptoms.  17:59--I rechecked the pt who is experiencing some relief from symptoms. I instructed him to follow up with Dr. Margo Aye.   1. Urticarial rash      New Prescriptions    FAMOTIDINE (PEPCID) 20 MG TABLET    Take 1 tablet (20 mg total) by mouth 2 (two) times daily.   HYDROXYZINE (ATARAX/VISTARIL) 25 MG TABLET    Take 1 or 2 po Q 6hrs prn itching or rash   PREDNISONE (DELTASONE) 20 MG TABLET    Take 3 po QD x 2d starting tomorrow, then 2 po QD x 3d then 1 po QD x 3d    Plan discharge  Devoria Albe, MD, FACEP    MDM   I personally performed the services described in this documentation, which was scribed in my presence. The recorded information has been reviewed and considered.  Devoria Albe, MD, FACEP    Ward Givens, MD 06/23/12 1815  Ward Givens, MD 06/23/12 1816

## 2012-06-23 NOTE — ED Notes (Signed)
Pt c/o of rash on head, shoulders, feet, hands that itch and is gradually getting worse. Thought it was the medications he was taking so he stopped taking them yesterday. Denies swelling of throat or difficulty breathing and swallowing.

## 2012-09-20 ENCOUNTER — Ambulatory Visit: Payer: Managed Care, Other (non HMO) | Admitting: Cardiology

## 2012-10-18 ENCOUNTER — Ambulatory Visit (INDEPENDENT_AMBULATORY_CARE_PROVIDER_SITE_OTHER): Payer: Managed Care, Other (non HMO) | Admitting: Cardiology

## 2012-10-18 ENCOUNTER — Encounter: Payer: Self-pay | Admitting: Cardiology

## 2012-10-18 VITALS — BP 178/88 | HR 97 | Ht 65.0 in | Wt 194.0 lb

## 2012-10-18 DIAGNOSIS — I2510 Atherosclerotic heart disease of native coronary artery without angina pectoris: Secondary | ICD-10-CM

## 2012-10-18 DIAGNOSIS — I7090 Unspecified atherosclerosis: Secondary | ICD-10-CM

## 2012-10-18 DIAGNOSIS — E119 Type 2 diabetes mellitus without complications: Secondary | ICD-10-CM

## 2012-10-18 DIAGNOSIS — I10 Essential (primary) hypertension: Secondary | ICD-10-CM

## 2012-10-18 DIAGNOSIS — N189 Chronic kidney disease, unspecified: Secondary | ICD-10-CM

## 2012-10-18 DIAGNOSIS — E785 Hyperlipidemia, unspecified: Secondary | ICD-10-CM

## 2012-10-18 MED ORDER — CLONIDINE HCL 0.2 MG/24HR TD PTWK: 1.0000 | MEDICATED_PATCH | TRANSDERMAL | Status: DC

## 2012-10-18 NOTE — Progress Notes (Signed)
Patient ID: [name], male   DOB: 03/24/1956, 57 y.o.   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  He developed episodes of urticaria a few months ago and has subsequently been evaluated by allergy and dermatology.  Multiple medication changes have resulted and symptomatic episodes have decreased, but there is not a clear relationship between these 2 events.  Blood pressure control has been suboptimal.  Prior to Admission medications   Medication Sig Start Date End Date Taking? Authorizing Provider  amLODipine (NORVASC) 5 MG tablet Take 5 mg by mouth daily.   Yes Historical Provider, MD  aspirin EC 81 MG tablet Take 81 mg by mouth daily.   Yes Historical Provider, MD  cetirizine (ZYRTEC) 10 MG tablet Take 10 mg by mouth daily.   Yes Historical Provider, MD  cimetidine (TAGAMET) 800 MG tablet Take 800 mg by mouth at bedtime.   Yes Historical Provider, MD  Flaxseed, Linseed, 1200 MG CAPS Take 2,400 mg by mouth daily.   Yes Historical Provider, MD  hydrochlorothiazide (HYDRODIURIL) 25 MG tablet Take 25 mg by mouth daily.   Yes Historical Provider, MD  hydrOXYzine (ATARAX/VISTARIL) 25 MG tablet Take 1 or 2 po Q 6hrs prn itching or rash 06/23/12  Yes Ward Givens, MD  insulin glargine (LANTUS SOLOSTAR) 100 UNIT/ML injection Inject 50 Units into the skin 2 (two) times daily.   Yes Historical Provider, MD  metFORMIN (GLUCOPHAGE) 1000 MG tablet Take 1,000 mg by mouth 2 (two) times daily with a meal.     Yes Historical Provider, MD  Multiple Vitamins-Minerals (MULTIVITAMIN WITH MINERALS) tablet Take 1 tablet by mouth daily.     Yes Historical Provider, MD  pravastatin (PRAVACHOL) 40 MG tablet Take 1 tablet (40 mg total) by mouth daily. 09/14/11  Yes Kathlen Brunswick, MD  ranitidine (ZANTAC) 75 MG tablet Take 75 mg by mouth 2 (two) times daily.   Yes Historical Provider, MD  cloNIDine (CATAPRES - DOSED IN  MG/24 HR) 0.2 mg/24hr patch Place 1 patch (0.2 mg total) onto the skin once a week. 10/18/12   Kathlen Brunswick, MD   Allergies  Allergen Reactions  . Ranexa (Ranolazine) Rash  . Wellbutrin (Bupropion Hcl) Rash  Past medical history, social history, and family history reviewed and updated.  ROS: Denies chest pain, orthopnea, or dyspnea, pedal edema, palpitations, lightheadedness or syncope.  He developed xerostomia in recent weeks, likely as a result of treatment with oral clonidine.  He has been less active in recent weeks and notes some fatigue during his workday.  All other systems reviewed and are negative.  PHYSICAL EXAM: BP 178/88  Pulse 97  Ht 5\' 5"  (1.651 m)  Wt 87.998 kg (194 lb)  BMI 32.28 kg/m2  SpO2 97%  General-Well developed; no acute distress Body habitus-proportionate weight and height Neck-No JVD; no carotid bruits Lungs-clear lung fields; resonant to percussion; decreased breath sounds at the right base Cardiovascular-normal PMI; normal S1 and S2; S4 present Abdomen-normal bowel sounds; soft and non-tender without masses or organomegaly Musculoskeletal-No deformities, no cyanosis or clubbing Neurologic-Normal cranial nerves; symmetric strength and tone Skin-Warm, no significant lesions Extremities-distal pulses intact; no edema  ASSESSMENT AND PLAN:  Goshen Bing, MD 10/18/2012 7:52 PM

## 2012-10-18 NOTE — Progress Notes (Deleted)
Name: [name]    DOB: 09-May-1956  Age: 57 y.o.  MR#: [id number]       PCP:  Bennie Pierini, FNP      Insurance: @PAYORNAME @   CC:   No chief complaint on file.  1 - MEDICATION LIST 2 - MEDICATIONS HAVE BEEN CHANGED MULTIPLE TIMES RECENTLY DUE TO SEVERE RASH.  HAS BEEN TO DERMATOLOGIST IN [place] AS WELL AS DR HALL IN [place].  WAS PREVIOUSLY ON TRIBENZOR 40/5/25 MG, WHICH WAS KEEPING BP UNDER CONTROL, HOWEVER THIS WAS THE BP MEDICATION THAT WAS STOPPED BY ALLERGIST. VS BP 178/88  Pulse 97  Ht 5\' 5"  (1.651 m)  Wt 194 lb (87.998 kg)  BMI 32.28 kg/m2  SpO2 97%  Weights Current Weight  10/18/12 194 lb (87.998 kg)  06/23/12 192 lb (87.091 kg)  09/14/11 195 lb (88.451 kg)    Blood Pressure  BP Readings from Last 3 Encounters:  10/18/12 178/88  06/23/12 175/91  09/14/11 170/96     Admit date:  (Not on file) Last encounter with RMR:  Visit date not found   Allergy Allergies  Allergen Reactions  . Ranexa (Ranolazine) Rash  . Wellbutrin (Bupropion Hcl) Rash    Current Outpatient Prescriptions  Medication Sig Dispense Refill  . amLODipine (NORVASC) 5 MG tablet Take 5 mg by mouth daily.      Marland Kitchen aspirin EC 81 MG tablet Take 81 mg by mouth daily.      . cetirizine (ZYRTEC) 10 MG tablet Take 10 mg by mouth daily.      . cimetidine (TAGAMET) 800 MG tablet Take 800 mg by mouth at bedtime.      . cloNIDine (CATAPRES) 0.1 MG tablet Take 0.1 mg by mouth 2 (two) times daily.      . Flaxseed, Linseed, 1200 MG CAPS Take 2,400 mg by mouth daily.      . hydrochlorothiazide (HYDRODIURIL) 25 MG tablet Take 25 mg by mouth daily.      . hydrOXYzine (ATARAX/VISTARIL) 25 MG tablet Take 1 or 2 po Q 6hrs prn itching or rash  60 tablet  0  . insulin glargine (LANTUS SOLOSTAR) 100 UNIT/ML injection Inject 50 Units into the skin 2 (two) times daily.      . metFORMIN (GLUCOPHAGE) 1000 MG tablet Take 1,000 mg by mouth 2 (two) times daily with a meal.        . Multiple Vitamins-Minerals  (MULTIVITAMIN WITH MINERALS) tablet Take 1 tablet by mouth daily.        . pravastatin (PRAVACHOL) 40 MG tablet Take 1 tablet (40 mg total) by mouth daily.  90 tablet  3  . ranitidine (ZANTAC) 75 MG tablet Take 75 mg by mouth 2 (two) times daily.        Discontinued Meds:    Medications Discontinued During This Encounter  Medication Reason  . diphenhydrAMINE (BENADRYL) 25 mg capsule Discontinued by provider  . famotidine (PEPCID) 20 MG tablet Discontinued by provider  . Melatonin 10 MG TABS Discontinued by provider  . Olmesartan-Amlodipine-HCTZ (TRIBENZOR) 40-5-25 MG TABS Discontinued by provider  . predniSONE (DELTASONE) 20 MG tablet Discontinued by provider    Patient Active Problem List  Diagnosis  . ERECTILE DYSFUNCTION, NON-ORGANIC  . Arteriosclerotic cardiovascular disease (ASCVD)  . Diabetes mellitus  . Gastroesophageal reflux disease  . Hyperlipidemia  . Hypertension  . Chronic kidney disease  . Tobacco abuse, in remission    LABS No visits with results within 3 Month(s) from this visit. Latest known visit  with results is:  Procedure visit on 02/21/2011  Component Date Value  . Glucose-Capillary 02/21/2011 167*  . Comment 1 02/21/2011 Documented in Chart   . Glucose-Capillary 02/21/2011 141*  . Comment 1 02/21/2011 Documented in Chart   . Comment 2 02/21/2011 Notify RN      Results for this Opt Visit:     Results for orders placed in visit on 02/21/11  GLUCOSE, CAPILLARY      Component Value Range   Glucose-Capillary 167 (*) 70 - 99 mg/dL   Comment 1 Documented in Chart    GLUCOSE, CAPILLARY      Component Value Range   Glucose-Capillary 141 (*) 70 - 99 mg/dL   Comment 1 Documented in Chart     Comment 2 Notify RN      EKG Orders placed in visit on 05/26/10  . CONVERTED CEMR EKG     Prior Assessment and Plan Problem List as of 10/18/2012            Cardiology Problems   Arteriosclerotic cardiovascular disease (ASCVD)   Last Assessment & Plan  Note   09/14/2011 Office Visit Addendum 09/14/2011  5:44 PM by Kathlen Brunswick, MD    Doing well now 15 months following uncomplicated CABG surgery.  No current symptoms to suggest recurrent myocardial ischemia.  Daily aspirin dose will be reduced to 81 mg.  Our focus will continue to be on optimal control of cardiovascular risk factors.    Hyperlipidemia   Last Assessment & Plan Note   09/14/2011 Office Visit Signed 09/14/2011  5:44 PM by Kathlen Brunswick, MD    No recent lipid profile available; one will be drawn.    Hypertension   Last Assessment & Plan Note   09/14/2011 Office Visit Signed 09/14/2011  5:44 PM by Kathlen Brunswick, MD    Blood pressure control slightly suboptimal.  Daily dose of hydrochlorothiazide will be increased to 25 mg per day.  Electrolytes and renal function will be monitored.      Other   ERECTILE DYSFUNCTION, NON-ORGANIC   Last Assessment & Plan Note   09/14/2011 Office Visit Signed 09/14/2011  5:43 PM by Kathlen Brunswick, MD    Rx for Viagra renewed.    Diabetes mellitus   Last Assessment & Plan Note   09/14/2011 Office Visit Signed 09/14/2011  5:43 PM by Kathlen Brunswick, MD    Patient notes good CBGs at home.  Treatment of diabetes is managed by patient's PCP; annual visit anticipated in the near future.    Gastroesophageal reflux disease   Chronic kidney disease   Last Assessment & Plan Note   09/14/2011 Office Visit Signed 09/14/2011  5:42 PM by Kathlen Brunswick, MD    No recent laboratory studies.  A chemistry profile will be obtained.    Tobacco abuse, in remission       Imaging: No results found.   FRS Calculation: Score not calculated. Missing: Total Cholesterol

## 2012-10-18 NOTE — Assessment & Plan Note (Addendum)
Recent laboratory results and records obtained from patient's PCP.  Creatinine was mildly elevated in 2010, but was normal in 2011.  Most recent value from 04/2012 also normal at 1.24.

## 2012-10-18 NOTE — Assessment & Plan Note (Signed)
Systolic blood pressures have been elevated since mid 2012 with diastolics 90-100.  Clonidine will be changed to a transdermal preparation and the dosage doubled.  He will likely require additional medication, either a beta blocker, ACE inhibitor, or both.  We will monitor blood pressure frequently until adequately controlled.

## 2012-10-18 NOTE — Assessment & Plan Note (Signed)
Patient has been asymptomatic from a cardiopulmonary standpoint since CABG surgery 2-1/2 years ago.  We will continue to attempt to optimally control cardiovascular risk factors.

## 2012-10-18 NOTE — Assessment & Plan Note (Addendum)
Recent CBGs have improved since patient's dose of Lantus insulin was increased.  Hemoglobin A1c was 8.0 when last measured in 04/2012.  PCP is monitoring and managing this problem.

## 2012-10-18 NOTE — Patient Instructions (Addendum)
Your physician recommends that you schedule a follow-up appointment in:  1 - 1 year follow up 2 - 1 month blood pressure check  Your physician has requested that you regularly monitor and record your blood pressure readings at home. Please use the same machine at the same time of day to check your readings and record them to bring to your follow-up visit.  Your physician has recommended you make the following change in your medication:  1 - Change catapres to catapres patch and apply once weekly

## 2012-10-18 NOTE — Assessment & Plan Note (Addendum)
Excellent control of hyperlipidemia with current therapy, which will be continued. 

## 2012-10-24 ENCOUNTER — Other Ambulatory Visit: Payer: Self-pay | Admitting: *Deleted

## 2012-10-24 MED ORDER — AMLODIPINE BESYLATE 5 MG PO TABS: 5.0000 mg | ORAL_TABLET | Freq: Every day | ORAL | Status: DC

## 2012-11-05 ENCOUNTER — Other Ambulatory Visit: Payer: Self-pay | Admitting: Cardiology

## 2012-12-04 ENCOUNTER — Encounter: Payer: Self-pay | Admitting: Cardiology

## 2013-03-05 ENCOUNTER — Other Ambulatory Visit (HOSPITAL_COMMUNITY): Payer: Self-pay | Admitting: Emergency Medicine

## 2013-03-05 ENCOUNTER — Ambulatory Visit (HOSPITAL_COMMUNITY)
Admission: RE | Admit: 2013-03-05 | Discharge: 2013-03-05 | Disposition: A | Discharge status: Home / Self Care | Payer: Managed Care, Other (non HMO) | Source: Ambulatory Visit | Attending: Emergency Medicine | Admitting: Emergency Medicine

## 2013-03-05 DIAGNOSIS — Z951 Presence of aortocoronary bypass graft: Secondary | ICD-10-CM | POA: Insufficient documentation

## 2013-03-05 DIAGNOSIS — R0781 Pleurodynia: Secondary | ICD-10-CM

## 2013-03-05 DIAGNOSIS — R079 Chest pain, unspecified: Secondary | ICD-10-CM | POA: Insufficient documentation

## 2013-06-16 ENCOUNTER — Telehealth: Payer: Self-pay | Admitting: *Deleted

## 2013-06-16 MED ORDER — AMLODIPINE BESYLATE 5 MG PO TABS: 5.0000 mg | ORAL_TABLET | Freq: Every day | ORAL | Status: DC

## 2013-06-16 NOTE — Telephone Encounter (Signed)
PT HAS SWITCHED PHARMACIES. HE IS NOW WITH [facility] AND NEEDS AMLOPIPINE 5MG   #90 IF POSSIBLE.

## 2013-06-16 NOTE — Telephone Encounter (Signed)
rx sent to pharmacy by e-script AS REQUESTED

## 2013-06-25 ENCOUNTER — Telehealth: Payer: Self-pay | Admitting: *Deleted

## 2013-06-25 NOTE — Telephone Encounter (Signed)
Spoke to pt to advise results/instructions. Pt understood. PT WILL BRING RECORDINGS BACK INTO OFFICE IN 2 WEEKS FOR REVIEW

## 2013-06-25 NOTE — Telephone Encounter (Signed)
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,  pt came into office per noted as a [facility] employee and checking office at this time per manager request, pt was advised about a year ago to change from Clonidine pills to Clonidine patch per complaint of dry mouth,the patch has helped with his dry mouth to a degree not completely , pt notes he has been alternating the patch in different areas for the past year with no concerns noted however pt removed the patch this Monday 06-23-13 and pt noted a cherry red skin irritation in the shape of the patch in the patch area only, noted area as pink this am, not raised today nor on Monday, pt advised this does not itch, denies SOB/swelling/headache/dizzyness or any other symptoms other than the skin irritation, pt does note 8 months ago he was diagnosed with Alpha Gout due to a tick bite and since then has become allergic to certain things he was never allergic to before, pt has NOT placed another patch back on per reaction this nurse took pt blood pressure while in office and noted 180/98 HR 89 however pt notes he has been very active this morning as well as a stressful situation he had to address earlier thus his BP would be elevated, pt has not taken home BP's, was given a BP monitor sheet and advised to have co-worker check BP daily and record readings, pt understood and advised to call his cell [phone], please advise

## 2013-06-25 NOTE — Telephone Encounter (Signed)
Agree with plan to monitor BP at home so I can make further recs.

## 2013-08-05 ENCOUNTER — Telehealth: Payer: Self-pay | Admitting: Cardiovascular Disease

## 2013-08-05 MED ORDER — PRAVASTATIN SODIUM 40 MG PO TABS: ORAL_TABLET | ORAL | Status: DC

## 2013-08-05 NOTE — Telephone Encounter (Signed)
Needs refill on Pravastatin 40mg  sent to [facility] / [facility]

## 2013-08-05 NOTE — Telephone Encounter (Signed)
Medication sent via escribe.  

## 2013-10-08 ENCOUNTER — Telehealth: Payer: Self-pay | Admitting: *Deleted

## 2013-10-08 MED ORDER — AMLODIPINE BESYLATE 5 MG PO TABS: 5.0000 mg | ORAL_TABLET | Freq: Every day | ORAL | Status: DC

## 2013-10-08 NOTE — Telephone Encounter (Signed)
rx to [facility] for amlodipine    C.Les Pou RN

## 2013-10-08 NOTE — Telephone Encounter (Signed)
[facility] PHONE [phone] FAX [phone]  AMLODIPINE 5 MG #90

## 2013-10-29 ENCOUNTER — Other Ambulatory Visit: Payer: Self-pay | Admitting: Internal Medicine

## 2013-10-29 DIAGNOSIS — R7989 Other specified abnormal findings of blood chemistry: Secondary | ICD-10-CM

## 2013-10-29 DIAGNOSIS — R945 Abnormal results of liver function studies: Principal | ICD-10-CM

## 2013-10-30 ENCOUNTER — Ambulatory Visit
Admission: RE | Admit: 2013-10-30 | Discharge: 2013-10-30 | Disposition: A | Discharge status: Home / Self Care | Payer: 59 | Source: Ambulatory Visit | Attending: Internal Medicine | Admitting: Internal Medicine

## 2013-10-30 DIAGNOSIS — R7989 Other specified abnormal findings of blood chemistry: Secondary | ICD-10-CM

## 2013-10-30 DIAGNOSIS — R945 Abnormal results of liver function studies: Principal | ICD-10-CM

## 2013-12-05 ENCOUNTER — Encounter: Payer: Self-pay | Admitting: Internal Medicine

## 2013-12-08 ENCOUNTER — Encounter: Payer: Self-pay | Admitting: *Deleted

## 2014-01-08 ENCOUNTER — Ambulatory Visit (INDEPENDENT_AMBULATORY_CARE_PROVIDER_SITE_OTHER): Payer: 59 | Admitting: Internal Medicine

## 2014-01-08 ENCOUNTER — Encounter: Payer: Self-pay | Admitting: Internal Medicine

## 2014-01-08 VITALS — BP 122/84 | HR 83 | Ht 67.0 in | Wt 187.0 lb

## 2014-01-08 DIAGNOSIS — K625 Hemorrhage of anus and rectum: Secondary | ICD-10-CM

## 2014-01-08 DIAGNOSIS — K648 Other hemorrhoids: Secondary | ICD-10-CM

## 2014-01-08 DIAGNOSIS — K649 Unspecified hemorrhoids: Secondary | ICD-10-CM

## 2014-01-08 MED ORDER — LIDOCAINE-HYDROCORTISONE ACE 2-2 % RE KIT: PACK | RECTAL | Status: DC

## 2014-01-08 NOTE — Progress Notes (Signed)
[name] 1956-09-08 [id number]  Note: This dictation was prepared with Dragon digital system. Any transcriptional errors that result from this procedure are unintentional.   History of Present Illness:  This is a 58 year old white male with symptomatic hemorrhoids. He describes rectal pain, bleeding and prolapse of rectal tissue with bowel movements. For the past 2 months, he has had intermittent diarrhea . He describes watery stools 3-4 at a time alternating with normal bowel movements. He is a diabetic taking metformin 1,000 mg twice a day, which may be contributing to diarrhea.. His last colonoscopy in May 2012 showed a sessile serrated adenoma and 1 hyperplastic polyp. He also had first-grade internal hemorrhoids. A recall colonoscopy is planned for May 2017. He has the been unable to sit for period of time after having a bowel movement because of rectal  pain. He has also had leakage and  soiling on his underwear. He would like to have this taken care of as efficiently as possible. He has a history of coronary artery disease and bypass graft in 2011.     Past Medical History  Diagnosis Date  . Arteriosclerotic cardiovascular disease (ASCVD) 2003    Non-Q MI with stent to RCA in 12/2001, normal EF, additional stents to mid CX and D1; cath in 02/2005-progressive or eye disease, 50-70% mid LAD, 90% distal LAD, 50% D1, 60-70% CX, small PDA with diffuse disease, patent RCA stent; repeat cath in 01/2008-no significant progression; non-Q MI in 02/2010 with 100% mid Cx at  prior stent, nondom. RCA with patent stent, 50% RI, nl EF, CABG-04/2010  . Diabetes mellitus     no insulin; patient recalls A1c of 6 in 2010  . Gastroesophageal reflux disease   . Hyperlipidemia     Predominantly elevated triglycerides  . Hypertension   . Anxiety   . Erectile dysfunction   . Chronic obstructive pulmonary disease   . Chronic kidney disease     Creatinine of 1.44 in 04/2009  . Tobacco abuse, in remission      35 pack years; discontinued 05/2010  . Abnormal LFTs   . Pneumonia 2006    2006  . Adenomatous colon polyp 2012  . Internal hemorrhoids   . Hepatic steatosis     Past Surgical History  Procedure Laterality Date  . Appendectomy    . Coronary artery bypass graft  05/2010    4 vessels August 2011  . Colonoscopy w/ polypectomy  2012    Dr. Olevia Perches; 2 polyps removed one of which was precancerous    Allergies  Allergen Reactions  . Ranexa [Ranolazine] Rash  . Wellbutrin [Bupropion Hcl] Rash    Family history and social history have been reviewed.  Review of Systems: Denies abdominal pain heartburn nausea  The remainder of the 10 point ROS is negative except as outlined in the H&P  Physical Exam: General Appearance Well developed, in no distress Eyes  Non icteric  HEENT  Non traumatic, normocephalic  Mouth No lesion, tongue papillated, no cheilosis Neck Supple without adenopathy, thyroid not enlarged, no carotid bruits, no JVD Lungs Clear to auscultation bilaterally COR Normal S1, normal S2, regular rhythm, no murmur, quiet precordium Abdomen soft nontender abdomen with normoactive bowel sounds. No distention Rectal and anoscopic exam reveals external hemorrhoidal tags. Normal to slightly decreased rectal sphincter tone. Large first-grade hemorrhoids with one second degree hemorrhoid prolapsing through the anal canal. They're edematous and hyperemic. There is no active bleeding. Stool is Hemoccult negative Extremities  No pedal edema Skin  No lesions Neurological Alert and oriented x 3 Psychological Normal mood and affect  Assessment and Plan:   Problem #1 Symptomatic first-grade and second grade internal hemorrhoids which was documented on a prior colonoscopy 3 years ago as well as today  and continues to bother the patient. We will put him on a hemorrhoidal regimen using ANA-Lex twice a day, sitz baths and probiotics for bacterial overgrowth. He will be referred for a  surgical consultation for possible banding or PPH depending on his surgical evaluation.    Delfin Edis 01/08/2014

## 2014-01-08 NOTE — Patient Instructions (Addendum)
We have sent the following medications to your pharmacy for you to pick up at your convenience: Ana-lex  You have been scheduled for an appointment with Dr Brantley Stage at [facility]. Your appointment is on Monday, 01/19/14 at 11:00 am. Please arrive at 10:45 am for registration. Make certain to bring a list of current medications, including any over the counter medications or vitamins. Also bring your co-pay if you have one as well as your insurance cards. [facility] is located at [address]. Should you need to reschedule your appointment, please contact them at [phone].  CC:Dr Delphina Cahill, Dr Brantley Stage

## 2014-01-19 ENCOUNTER — Encounter (INDEPENDENT_AMBULATORY_CARE_PROVIDER_SITE_OTHER): Payer: Self-pay | Admitting: Surgery

## 2014-01-19 ENCOUNTER — Ambulatory Visit (INDEPENDENT_AMBULATORY_CARE_PROVIDER_SITE_OTHER): Payer: Commercial Managed Care - PPO | Admitting: Surgery

## 2014-01-19 VITALS — BP 148/84 | HR 82 | Temp 97.9°F | Resp 14 | Ht 67.0 in | Wt 186.2 lb

## 2014-01-19 DIAGNOSIS — K649 Unspecified hemorrhoids: Secondary | ICD-10-CM

## 2014-01-19 NOTE — Patient Instructions (Signed)
Hemorrhoidectomy Hemorrhoidectomy is surgery to remove hemorrhoids. Hemorrhoids are veins that have become swollen in the rectum. The rectum is the area from the bottom end of the intestines to the opening where bowel movements leave the body. Hemorrhoids can be uncomfortable. They can cause itching, bleeding and pain if a blood clot forms in them (thrombose). If hemorrhoids are small, surgery may not be needed. But if they cover a larger area, surgery is usually suggested.  LET YOUR CAREGIVER KNOW ABOUT:   Any allergies.  All medications you are taking, including:  Herbs, eyedrops, over-the-counter medications and creams.  Blood thinners (anticoagulants), aspirin or other drugs that could affect blood clotting.  Use of steroids (by mouth or as creams).  Previous problems with anesthetics, including local anesthetics.  Possibility of pregnancy, if this applies.  Any history of blood clots.  Any history of bleeding or other blood problems.  Previous surgery.  Smoking history.  Other health problems. RISKS AND COMPLICATIONS All surgery carries some risk. However, hemorrhoid surgery usually goes smoothly. Possible complications could include:  Urinary retention.  Bleeding.  Infection.  A painful incision.  A reaction to the anesthesia (this is not common). BEFORE THE PROCEDURE   Stop using aspirin and non-steroidal anti-inflammatory drugs (NSAIDs) for pain relief. This includes prescription drugs and over-the-counter drugs such as ibuprofen and naproxen. Also stop taking vitamin E. If possible, do this two weeks before your surgery.  If you take blood-thinners, ask your healthcare provider when you should stop taking them.  You will probably have blood and urine tests done several days before your surgery.  Do not eat or drink for about 8 hours before the surgery.  Arrive at least an hour before the surgery, or whenever your surgeon recommends. This will give you time to  check in and fill out any needed paperwork.  Hemorrhoidectomy is often an outpatient procedure. This means you will be able to go home the same day. Sometimes, though, people stay overnight in the hospital after the procedure. Ask your surgeon what to expect. Either way, make arrangements in advance for someone to drive you home. PROCEDURE   The preparation:  You will change into a hospital gown.  You will be given an IV. A needle will be inserted in your arm. Medication can flow directly into your body through this needle.  You might be given an enema to clear your rectum.  Once in the operating room, you will probably lie on your side or be repositioned later to lying on your stomach.  You will be given anesthesia (medication) so you will not feel anything during the surgery. The surgery often is done with local anesthesia (the area near the hemorrhoids will be numb and you will be drowsy but awake). Sometimes, general anesthesia is used (you will be asleep during the procedure).  The procedure:  There are a few different procedures for hemorrhoids. Be sure to ask you surgeon about the procedure, the risks and benefits.  Be sure to ask about what you need to do to take care of the wound, if there is one. AFTER THE PROCEDURE  You will stay in a recovery area until the anesthesia has worn off. Your blood pressure and pulse will be checked every so often.  You may feel a lot of pain in the area of the rectum.  Take all pain medication prescribed by your surgeon. Ask before taking any over-the-counter pain medicines.  Sometimes sitting in a warm bath can help relieve  your pain.  To make sure you have bowel movements without straining:  You will probably need to take stool softeners (usually a pill) for a few days.  You should drink 8 to 10 glasses of water each day.  Your activity will be restricted for awhile. Ask your caregiver for a list of what you should and should not do  while you recover. Document Released: 07/23/2009 Document Revised: 12/18/2011 Document Reviewed: 07/23/2009 [facility] Patient Information 2014 [place], [place].

## 2014-01-19 NOTE — Progress Notes (Signed)
Patient ID: [name], male   DOB: [birth year], 58 y.o.   MRN: [id number]  Chief Complaint  Patient presents with  . New Evaluation    eval hems    HPI [name] is a 58 y.o. male.  Patient sent at the request of Dr. Olevia Perches for hemorrhoid disease. Patient has multiple year history of rectal bleeding, rectal pain, and prolapsed hemorrhoids. He has tried multiple medical treatments without success. He has bleeding when he wipes a small amount in the commode. Had colonoscopy in 2012 which showed this. The symptoms are fairly constant. The bleeding waxes and wanes become more constant after bowel movements. He does get diarrhea from his diabetes medications. HPI  Past Medical History  Diagnosis Date  . Arteriosclerotic cardiovascular disease (ASCVD) 2003    Non-Q MI with stent to RCA in 12/2001, normal EF, additional stents to mid CX and D1; cath in 02/2005-progressive or eye disease, 50-70% mid LAD, 90% distal LAD, 50% D1, 60-70% CX, small PDA with diffuse disease, patent RCA stent; repeat cath in 01/2008-no significant progression; non-Q MI in 02/2010 with 100% mid Cx at  prior stent, nondom. RCA with patent stent, 50% RI, nl EF, CABG-04/2010  . Diabetes mellitus     no insulin; patient recalls A1c of 6 in 2010  . Gastroesophageal reflux disease   . Hyperlipidemia     Predominantly elevated triglycerides  . Hypertension   . Anxiety   . Erectile dysfunction   . Chronic obstructive pulmonary disease   . Chronic kidney disease     Creatinine of 1.44 in 04/2009  . Tobacco abuse, in remission     35 pack years; discontinued 05/2010  . Abnormal LFTs   . Pneumonia 2006    2006  . Adenomatous colon polyp 2012  . Internal hemorrhoids   . Hepatic steatosis     Past Surgical History  Procedure Laterality Date  . Appendectomy    . Coronary artery bypass graft  05/2010    4 vessels August 2011  . Colonoscopy w/ polypectomy  2012    Dr. Olevia Perches; 2 polyps removed one of which was  precancerous    Family History  Problem Relation Age of Onset  . Cirrhosis Mother     + Sister  . Heart disease Father   . Pancreatic cancer Brother     Social History History  Substance Use Topics  . Smoking status: Former Smoker -- 1.00 packs/day for 30 years    Types: Cigarettes    Quit date: 02/06/2010  . Smokeless tobacco: Never Used  . Alcohol Use: No    Allergies  Allergen Reactions  . Ranexa [Ranolazine] Rash  . Wellbutrin [Bupropion Hcl] Rash    Current Outpatient Prescriptions  Medication Sig Dispense Refill  . amLODipine (NORVASC) 5 MG tablet Take 1 tablet (5 mg total) by mouth daily.  90 tablet  6  . aspirin EC 81 MG tablet Take 81 mg by mouth daily.      Marland Kitchen EPINEPHrine (EPI-PEN) 0.3 mg/0.3 mL SOAJ injection Inject 0.3 mg into the muscle as needed.      . Flaxseed, Linseed, 1200 MG CAPS Take 2,400 mg by mouth daily.      . hydrOXYzine (ATARAX/VISTARIL) 25 MG tablet Take 1 or 2 po Q 6hrs prn itching or rash  60 tablet  0  . insulin glargine (LANTUS SOLOSTAR) 100 UNIT/ML injection Inject 50 Units into the skin 2 (two) times daily.      . Lidocaine-Hydrocortisone  Ace 2-2 % KIT 1 application to rectum twice daily  1 each  1  . lisinopril-hydrochlorothiazide (PRINZIDE,ZESTORETIC) 10-12.5 MG per tablet Take 1 tablet by mouth daily.      . metFORMIN (GLUCOPHAGE) 1000 MG tablet Take 1,000 mg by mouth 2 (two) times daily with a meal.        . Multiple Vitamins-Minerals (MULTIVITAMIN WITH MINERALS) tablet Take 1 tablet by mouth daily.        . pravastatin (PRAVACHOL) 40 MG tablet TAKE 1 TABLET (40 MG TOTAL) DAILY  90 tablet  2  . predniSONE (DELTASONE) 10 MG tablet Take 10 mg by mouth as needed.      . ranitidine (ZANTAC) 75 MG tablet Take 75 mg by mouth 2 (two) times daily.       No current facility-administered medications for this visit.    Review of Systems Review of Systems  Constitutional: Negative for fever, chills and unexpected weight change.  HENT: Negative  for congestion, hearing loss, sore throat, trouble swallowing and voice change.   Eyes: Negative for visual disturbance.  Respiratory: Negative for cough and wheezing.   Cardiovascular: Negative for chest pain, palpitations and leg swelling.  Gastrointestinal: Positive for diarrhea, anal bleeding and rectal pain. Negative for nausea, vomiting, abdominal pain, constipation, blood in stool and abdominal distention.  Genitourinary: Negative for hematuria and difficulty urinating.  Musculoskeletal: Negative for arthralgias.  Skin: Negative for rash and wound.  Neurological: Negative for seizures, syncope, weakness and headaches.  Hematological: Negative for adenopathy. Does not bruise/bleed easily.  Psychiatric/Behavioral: Negative for confusion.    Blood pressure 148/84, pulse 82, temperature 97.9 F (36.6 C), temperature source Temporal, resp. rate 14, height _0  (1.702 m), weight 186 lb 3.2 oz (84.46 kg).  Physical Exam Physical Exam  Constitutional: He appears well-developed and well-nourished.  HENT:  Head: Normocephalic and atraumatic.  Eyes: EOM are normal.  Neck: Normal range of motion. Neck supple.  Cardiovascular: Normal rate and regular rhythm.   Pulmonary/Chest: Effort normal and breath sounds normal.  Genitourinary:       Data Reviewed Dr Olevia Perches notes/ colonoscopy 2012  Assessment    Grade 3         3 column internal and external hemorrhoid disease    Plan    Discussed options of medical, office-based or outpatient  surgery based procedures.  I do not feel he is a good candidate for banding or sclerotherapy given the size and extent of internal and external disease. Discussed hemorrhoidectomy with him. He is not a good PPH candidate as well. Recommend 3 column hemorrhoidectomy. Risk of bleeding, infection, stenosis, injury to neighboring structures, injury to sphincter, incontinence, DVT, death, the need for other operative procedures, discussed. He agrees to  proceed.       Priscilla Finklea A. Raeli Wiens 01/19/2014, 12:02 PM

## 2014-02-03 ENCOUNTER — Ambulatory Visit (INDEPENDENT_AMBULATORY_CARE_PROVIDER_SITE_OTHER): Payer: 59 | Admitting: Cardiology

## 2014-02-03 ENCOUNTER — Encounter: Payer: Self-pay | Admitting: Cardiology

## 2014-02-03 VITALS — BP 152/76 | HR 83 | Ht 64.0 in | Wt 188.0 lb

## 2014-02-03 DIAGNOSIS — I7090 Unspecified atherosclerosis: Secondary | ICD-10-CM

## 2014-02-03 DIAGNOSIS — I2510 Atherosclerotic heart disease of native coronary artery without angina pectoris: Secondary | ICD-10-CM

## 2014-02-03 DIAGNOSIS — I10 Essential (primary) hypertension: Secondary | ICD-10-CM

## 2014-02-03 MED ORDER — NITROGLYCERIN 0.4 MG SL SUBL: 0.4000 mg | SUBLINGUAL_TABLET | SUBLINGUAL | Status: DC | PRN

## 2014-02-03 MED ORDER — AMLODIPINE BESYLATE 10 MG PO TABS: 10.0000 mg | ORAL_TABLET | Freq: Every day | ORAL | Status: DC

## 2014-02-03 NOTE — Patient Instructions (Signed)
Your physician wants you to follow-up in: 6 mons You will receive a reminder letter in the mail two months in advance. If you don't receive a letter, please call our office to schedule the follow-up appointment.     Your physician has recommended you make the following change in your medication:    INCREASE Norvasc to 10 mg daily    Thank you for choosing [facility] !

## 2014-02-03 NOTE — Progress Notes (Addendum)
Clinical Summary [name] is a 58 y.o.male former patient of Dr Lattie Haw, this is our first visit together. He is seen for the following medical problems.  1. CAD - multiple stents placed as described below - last cath 2011: LM patent, LAD 95% at apex, patent stent D1, LCX occluded in midsection at prior stent, RCA non dominant with patent prox stent. LVEF 55-60%. - CABG 05/2010 LIMA-LAD, SVG-Dx, SVG-ramus SVG-PL.  - 04/2010 echo LVEF 55-60%, grade II diastolic dysfunction.   - occas mild chest discomort at times, occurred after eating after one episode. Lasted 20 minutes. No associated symptoms. Aching feeling.  - compliant with meds  2. HTN - does not check regularly - compliant with meds  3. Hyperlipidemia  - followed by Dr Nevada Crane and endocrinoloigist, reports has been at goal.   Past Medical History  Diagnosis Date  . Arteriosclerotic cardiovascular disease (ASCVD) 2003    Non-Q MI with stent to RCA in 12/2001, normal EF, additional stents to mid CX and D1; cath in 02/2005-progressive or eye disease, 50-70% mid LAD, 90% distal LAD, 50% D1, 60-70% CX, small PDA with diffuse disease, patent RCA stent; repeat cath in 01/2008-no significant progression; non-Q MI in 02/2010 with 100% mid Cx at  prior stent, nondom. RCA with patent stent, 50% RI, nl EF, CABG-04/2010  . Diabetes mellitus     no insulin; patient recalls A1c of 6 in 2010  . Gastroesophageal reflux disease   . Hyperlipidemia     Predominantly elevated triglycerides  . Hypertension   . Anxiety   . Erectile dysfunction   . Chronic obstructive pulmonary disease   . Chronic kidney disease     Creatinine of 1.44 in 04/2009  . Tobacco abuse, in remission     35 pack years; discontinued 05/2010  . Abnormal LFTs   . Pneumonia 2006    2006  . Adenomatous colon polyp 2012  . Internal hemorrhoids   . Hepatic steatosis      Allergies  Allergen Reactions  . Ranexa [Ranolazine] Rash  . Wellbutrin [Bupropion Hcl] Rash      Current Outpatient Prescriptions  Medication Sig Dispense Refill  . amLODipine (NORVASC) 5 MG tablet Take 1 tablet (5 mg total) by mouth daily.  90 tablet  6  . aspirin EC 81 MG tablet Take 81 mg by mouth daily.      Marland Kitchen EPINEPHrine (EPI-PEN) 0.3 mg/0.3 mL SOAJ injection Inject 0.3 mg into the muscle as needed.      . Flaxseed, Linseed, 1200 MG CAPS Take 2,400 mg by mouth daily.      . hydrOXYzine (ATARAX/VISTARIL) 25 MG tablet Take 1 or 2 po Q 6hrs prn itching or rash  60 tablet  0  . insulin glargine (LANTUS SOLOSTAR) 100 UNIT/ML injection Inject 50 Units into the skin 2 (two) times daily.      . Lidocaine-Hydrocortisone Ace 2-2 % KIT 1 application to rectum twice daily  1 each  1  . lisinopril-hydrochlorothiazide (PRINZIDE,ZESTORETIC) 10-12.5 MG per tablet Take 1 tablet by mouth daily.      . metFORMIN (GLUCOPHAGE) 1000 MG tablet Take 1,000 mg by mouth 2 (two) times daily with a meal.        . Multiple Vitamins-Minerals (MULTIVITAMIN WITH MINERALS) tablet Take 1 tablet by mouth daily.        . pravastatin (PRAVACHOL) 40 MG tablet TAKE 1 TABLET (40 MG TOTAL) DAILY  90 tablet  2  . predniSONE (DELTASONE) 10 MG  tablet Take 10 mg by mouth as needed.      . ranitidine (ZANTAC) 75 MG tablet Take 75 mg by mouth 2 (two) times daily.       No current facility-administered medications for this visit.     Past Surgical History  Procedure Laterality Date  . Appendectomy    . Coronary artery bypass graft  05/2010    4 vessels August 2011  . Colonoscopy w/ polypectomy  2012    Dr. Olevia Perches; 2 polyps removed one of which was precancerous     Allergies  Allergen Reactions  . Ranexa [Ranolazine] Rash  . Wellbutrin [Bupropion Hcl] Rash      Family History  Problem Relation Age of Onset  . Cirrhosis Mother     + Sister  . Heart disease Father   . Pancreatic cancer Brother      Social History [name] reports that he quit smoking about 3 years ago. His smoking use included  Cigarettes. He has a 30 pack-year smoking history. He has never used smokeless tobacco. [name] reports that he does not drink alcohol.   Review of Systems CONSTITUTIONAL: No weight loss, fever, chills, weakness or fatigue.  HEENT: Eyes: No visual loss, blurred vision, double vision or yellow sclerae.No hearing loss, sneezing, congestion, runny nose or sore throat.  SKIN: No rash or itching.  CARDIOVASCULAR: per HPI RESPIRATORY: No shortness of breath, cough or sputum.  GASTROINTESTINAL: No anorexia, nausea, vomiting or diarrhea. No abdominal pain or blood.  GENITOURINARY: No burning on urination, no polyuria NEUROLOGICAL: No headache, dizziness, syncope, paralysis, ataxia, numbness or tingling in the extremities. No change in bowel or bladder control.  MUSCULOSKELETAL: No muscle, back pain, joint pain or stiffness.  LYMPHATICS: No enlarged nodes. No history of splenectomy.  PSYCHIATRIC: No history of depression or anxiety.  ENDOCRINOLOGIC: No reports of sweating, cold or heat intolerance. No polyuria or polydipsia.  Marland Kitchen   Physical Examination p 83 bp 140/70 Wt 188 lbs BMI 32 Gen: resting comfortably, no acute distress HEENT: no scleral icterus, pupils equal round and reactive, no palptable cervical adenopathy,  CV: RRR, no m/r/,g no JVD, no carotid bruits Resp: Clear to auscultation bilaterally GI: abdomen is soft, non-tender, non-distended, normal bowel sounds, no hepatosplenomegaly MSK: extremities are warm, no edema.  Skin: warm, no rash Neuro:  no focal deficits Psych: appropriate affect   Diagnostic Studies 2011 Cath Central aortic pressure 138/73 with a mean of 97.  LV pressure 140/22 with an EDP of 24.  There was no aortic stenosis.  Left main was normal.  LAD was a large vessel coursed to the apex.  It gave off a moderate-sized proximal diagonal.  Throughout the LAD, there was mild diffuse disease with a focal 30% lesion in the midsection and a high-grade 95%  stenosis at the apex.  In the first diagonal, there was a previously placed stent, which was patent.  There was minor luminal irregularities otherwise.  Left circumflex was a large dominant vessel, gave off a small-to- moderate ramus Maxim Bedel, a small OM Mechelle Pates and then was totally occluded in the midsection within the previously placed stent.  There was a 50% lesion in the mid AV groove circumflex right before the stent.  There was also a 50% lesion in the upper portion of the ramus Madiline Saffran.  Right coronary artery was nondominant vessel and had a high anterior takeoff.  There was a stent placed in the proximal portion, which was widely patent.  There was an  RV Faustina Gebert, which was subtotally occluded. The vessel what appeared to be an atrial Merl Guardino, which provided collaterals to the distal left circumflex system, primarily the left PDA.  The distal right coronary once again was nondominant and small.  Left ventriculogram was done in the RAO position showed an EF of 55-60%. No regional wall motion abnormalities.  ASSESSMENT: 1. Three-vessel coronary artery disease as described above. 2. Reocclusion of the mid left circumflex drug-eluting stent with     right-to-left collaterals. 3. Normal left ventricular function.  PLAN:  I have reviewed the catheterization films with Dr. Lia Foyer and Dr. Olevia Perches.  Given that he has already two layers of drug-eluting stent, which he is re-occluded, likelihood that he would be able to keep this area open.  After angioplasty, it would be quite low.  We thus recommended titration of his medical therapy.  He was previously on Viagra, but is no longer taking, so I will add Imdur 30 mg a day, may also be able to consider addition of Ranexa.  Other possibilities would be possible single-vessel bypass surgery versus consideration of brachytherapy at [facility].  Dr. Lia Foyer will look into this.  I will discuss with Dr. Lattie Haw.  04/2010  Cath Study Conclusions  - Left ventricle: The cavity size was normal. Wall thickness was   increased in a pattern of mild LVH. There was mild focal basal   hypertrophy of the septum. Systolic function was normal. The   estimated ejection fraction was in the range of 55% to 60%. There   appears to be mild mid posterior hypokinesis. Features are   consistent with a pseudonormal left ventricular filling pattern,   with concomitant abnormal relaxation and increased filling   pressure (grade 2 diastolic dysfunction). - Aortic valve: There was no stenosis. - Mitral valve: Mild regurgitation. - Left atrium: The atrium was mildly dilated. - Right ventricle: The cavity size was normal. Systolic function was   normal. - Pulmonary arteries: No complete TR doppler jet was measured so   unable to estimate PA systolic pressure. - Inferior vena cava: The vessel was normal in size; the   respirophasic diameter changes were in the normal range (= 50%);   findings are consistent with normal central venous pressure. Impressions:  - Normal LV size with mild LV hypertrophy. EF 55-60% with mild mid   posterior hypokinesis.Normal RV size and systolic function. Mild   mitral regurgitation. Transthoracic echocardiography. M-mode, complete 2D, spectral Doppler, and color Doppler.   02/03/14 Clinic EKG NSR, 1st degree AV block  Assessment and Plan  1. CAD - mild, non-specific chest pain at times. Continue medical therapy for now, if progresses consider ischemic evaluation at that time - increase norvasc as antianginal  2. HTN - not at goal, given DM goal <130/80 - increase norvasc to 75m daily  3. Hyperlipidemia - defer management to PCP and endocrinologist, no recent panels in our system - goal LDL <70. Consider high dose based on most recent lipid guidelines and his history of CAD.     JArnoldo Lenis M.D., F.A.C.C.

## 2014-02-18 ENCOUNTER — Encounter (HOSPITAL_BASED_OUTPATIENT_CLINIC_OR_DEPARTMENT_OTHER): Payer: Self-pay | Admitting: *Deleted

## 2014-02-18 NOTE — Progress Notes (Signed)
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

## 2014-02-18 NOTE — Progress Notes (Signed)
02/18/14 1401  OBSTRUCTIVE SLEEP APNEA  Have you ever been diagnosed with sleep apnea through a sleep study? No  Do you snore loudly (loud enough to be heard through closed doors)?  0  Do you often feel tired, fatigued, or sleepy during the daytime? 0  Has anyone observed you stop breathing during your sleep? 0  Do you have, or are you being treated for high blood pressure? 1  BMI more than 35 kg/m2? 0  Age over 60 years old? 1  Neck circumference greater than 40 cm/16 inches? 1  Gender: 1  Obstructive Sleep Apnea Score 4  Score 4 or greater  Results sent to PCP

## 2014-02-19 ENCOUNTER — Encounter (HOSPITAL_COMMUNITY)
Admission: RE | Admit: 2014-02-19 | Discharge: 2014-02-19 | Disposition: A | Discharge status: Home / Self Care | Payer: 59 | Source: Ambulatory Visit | Attending: Surgery | Admitting: Surgery

## 2014-02-19 DIAGNOSIS — Z01812 Encounter for preprocedural laboratory examination: Secondary | ICD-10-CM | POA: Insufficient documentation

## 2014-02-19 LAB — COMPREHENSIVE METABOLIC PANEL
ALBUMIN: 3.7 g/dL (ref 3.5–5.2)
ALK PHOS: 186 U/L — AB (ref 39–117)
ALT: 20 U/L (ref 0–53)
AST: 19 U/L (ref 0–37)
BUN: 20 mg/dL (ref 6–23)
CALCIUM: 10 mg/dL (ref 8.4–10.5)
CO2: 27 mEq/L (ref 19–32)
Chloride: 97 mEq/L (ref 96–112)
Creatinine, Ser: 1.35 mg/dL (ref 0.50–1.35)
GFR calc Af Amer: 65 mL/min — ABNORMAL LOW (ref >90)
GFR calc non Af Amer: 56 mL/min — ABNORMAL LOW (ref >90)
Glucose, Bld: 169 mg/dL — ABNORMAL HIGH (ref 70–99)
POTASSIUM: 4.4 meq/L (ref 3.7–5.3)
SODIUM: 138 meq/L (ref 137–147)
TOTAL PROTEIN: 6.9 g/dL (ref 6.0–8.3)
Total Bilirubin: 0.3 mg/dL (ref 0.3–1.2)

## 2014-02-19 LAB — CBC WITH DIFFERENTIAL/PLATELET
BASOS PCT: 0 % (ref 0–1)
Basophils Absolute: 0 10*3/uL (ref 0.0–0.1)
EOS ABS: 0.4 10*3/uL (ref 0.0–0.7)
EOS PCT: 4 % (ref 0–5)
HCT: 37.6 % — ABNORMAL LOW (ref 39.0–52.0)
Hemoglobin: 12.4 g/dL — ABNORMAL LOW (ref 13.0–17.0)
Lymphocytes Relative: 18 % (ref 12–46)
Lymphs Abs: 1.8 10*3/uL (ref 0.7–4.0)
MCH: 25.7 pg — AB (ref 26.0–34.0)
MCHC: 33 g/dL (ref 30.0–36.0)
MCV: 78 fL (ref 78.0–100.0)
MONOS PCT: 5 % (ref 3–12)
Monocytes Absolute: 0.5 10*3/uL (ref 0.1–1.0)
NEUTROS PCT: 72 % (ref 43–77)
Neutro Abs: 7.2 10*3/uL (ref 1.7–7.7)
PLATELETS: 267 10*3/uL (ref 150–400)
RBC: 4.82 MIL/uL (ref 4.22–5.81)
RDW: 15.3 % (ref 11.5–15.5)
WBC: 10 10*3/uL (ref 4.0–10.5)

## 2014-02-19 NOTE — Progress Notes (Signed)
Reviewed with dr crews-ok 

## 2014-02-24 ENCOUNTER — Encounter (HOSPITAL_BASED_OUTPATIENT_CLINIC_OR_DEPARTMENT_OTHER)
Admission: RE | Disposition: A | Discharge status: Home / Self Care | Payer: Self-pay | Source: Ambulatory Visit | Attending: Surgery

## 2014-02-24 ENCOUNTER — Ambulatory Visit (HOSPITAL_BASED_OUTPATIENT_CLINIC_OR_DEPARTMENT_OTHER): Payer: 59 | Admitting: Anesthesiology

## 2014-02-24 ENCOUNTER — Ambulatory Visit (HOSPITAL_BASED_OUTPATIENT_CLINIC_OR_DEPARTMENT_OTHER)
Admission: RE | Admit: 2014-02-24 | Discharge: 2014-02-24 | Disposition: A | Discharge status: Home / Self Care | Payer: 59 | Source: Ambulatory Visit | Attending: Surgery | Admitting: Surgery

## 2014-02-24 ENCOUNTER — Encounter (HOSPITAL_BASED_OUTPATIENT_CLINIC_OR_DEPARTMENT_OTHER): Payer: Self-pay | Admitting: *Deleted

## 2014-02-24 ENCOUNTER — Encounter (HOSPITAL_BASED_OUTPATIENT_CLINIC_OR_DEPARTMENT_OTHER): Payer: 59 | Admitting: Anesthesiology

## 2014-02-24 ENCOUNTER — Telehealth (INDEPENDENT_AMBULATORY_CARE_PROVIDER_SITE_OTHER): Payer: Self-pay

## 2014-02-24 DIAGNOSIS — Z87891 Personal history of nicotine dependence: Secondary | ICD-10-CM | POA: Insufficient documentation

## 2014-02-24 DIAGNOSIS — N189 Chronic kidney disease, unspecified: Secondary | ICD-10-CM | POA: Insufficient documentation

## 2014-02-24 DIAGNOSIS — K219 Gastro-esophageal reflux disease without esophagitis: Secondary | ICD-10-CM | POA: Insufficient documentation

## 2014-02-24 DIAGNOSIS — J449 Chronic obstructive pulmonary disease, unspecified: Secondary | ICD-10-CM | POA: Insufficient documentation

## 2014-02-24 DIAGNOSIS — K648 Other hemorrhoids: Secondary | ICD-10-CM | POA: Insufficient documentation

## 2014-02-24 DIAGNOSIS — I252 Old myocardial infarction: Secondary | ICD-10-CM | POA: Insufficient documentation

## 2014-02-24 DIAGNOSIS — K644 Residual hemorrhoidal skin tags: Secondary | ICD-10-CM

## 2014-02-24 DIAGNOSIS — I2510 Atherosclerotic heart disease of native coronary artery without angina pectoris: Secondary | ICD-10-CM | POA: Insufficient documentation

## 2014-02-24 DIAGNOSIS — Z794 Long term (current) use of insulin: Secondary | ICD-10-CM | POA: Insufficient documentation

## 2014-02-24 DIAGNOSIS — Z951 Presence of aortocoronary bypass graft: Secondary | ICD-10-CM | POA: Insufficient documentation

## 2014-02-24 DIAGNOSIS — Z9861 Coronary angioplasty status: Secondary | ICD-10-CM | POA: Insufficient documentation

## 2014-02-24 DIAGNOSIS — I129 Hypertensive chronic kidney disease with stage 1 through stage 4 chronic kidney disease, or unspecified chronic kidney disease: Secondary | ICD-10-CM | POA: Insufficient documentation

## 2014-02-24 DIAGNOSIS — J4489 Other specified chronic obstructive pulmonary disease: Secondary | ICD-10-CM | POA: Insufficient documentation

## 2014-02-24 DIAGNOSIS — E119 Type 2 diabetes mellitus without complications: Secondary | ICD-10-CM | POA: Insufficient documentation

## 2014-02-24 HISTORY — DX: Complete loss of teeth, unspecified cause, unspecified class: K08.109

## 2014-02-24 HISTORY — DX: Allergy, unspecified, initial encounter: T78.40XA

## 2014-02-24 HISTORY — DX: Presence of dental prosthetic device (complete) (partial): Z97.2

## 2014-02-24 HISTORY — PX: HEMORRHOID SURGERY: SHX153

## 2014-02-24 LAB — POCT HEMOGLOBIN-HEMACUE: Hemoglobin: 13 g/dL (ref 13.0–17.0)

## 2014-02-24 LAB — GLUCOSE, CAPILLARY
GLUCOSE-CAPILLARY: 116 mg/dL — AB (ref 70–99)
GLUCOSE-CAPILLARY: 111 mg/dL — AB (ref 70–99)
Glucose-Capillary: 99 mg/dL (ref 70–99)

## 2014-02-24 SURGERY — HEMORRHOIDECTOMY
Anesthesia: General | Site: Rectum

## 2014-02-24 MED ORDER — POLYETHYLENE GLYCOL 3350 17 GM/SCOOP PO POWD: 1.0000 | Freq: Once | ORAL | Status: DC

## 2014-02-24 MED ORDER — OXYCODONE HCL 5 MG PO TABS
5.0000 mg | ORAL_TABLET | Freq: Once | ORAL | Status: AC | PRN
  Administered 2014-02-24: 5 mg via ORAL

## 2014-02-24 MED ORDER — FENTANYL CITRATE 0.05 MG/ML IJ SOLN: 50.0000 ug | INTRAMUSCULAR | Status: DC | PRN

## 2014-02-24 MED ORDER — BUPIVACAINE LIPOSOME 1.3 % IJ SUSP
INTRAMUSCULAR | Status: AC
  Filled 2014-02-24: qty 20

## 2014-02-24 MED ORDER — OXYCODONE-ACETAMINOPHEN 10-325 MG PO TABS: 1.0000 | ORAL_TABLET | ORAL | Status: DC | PRN

## 2014-02-24 MED ORDER — LIDOCAINE HCL 2 % EX GEL: 1.0000 "application " | CUTANEOUS | Status: DC | PRN

## 2014-02-24 MED ORDER — MIDAZOLAM HCL 2 MG/2ML IJ SOLN: 1.0000 mg | INTRAMUSCULAR | Status: DC | PRN

## 2014-02-24 MED ORDER — LACTATED RINGERS IV SOLN
INTRAVENOUS | Status: DC
  Administered 2014-02-24 (×2): via INTRAVENOUS

## 2014-02-24 MED ORDER — FENTANYL CITRATE 0.05 MG/ML IJ SOLN: 25.0000 ug | INTRAMUSCULAR | Status: DC | PRN

## 2014-02-24 MED ORDER — CEFAZOLIN SODIUM-DEXTROSE 2-3 GM-% IV SOLR
2.0000 g | INTRAVENOUS | Status: AC
  Administered 2014-02-24: 2 g via INTRAVENOUS

## 2014-02-24 MED ORDER — BUPIVACAINE LIPOSOME 1.3 % IJ SUSP
INTRAMUSCULAR | Status: DC | PRN
  Administered 2014-02-24: 15 mL

## 2014-02-24 MED ORDER — FENTANYL CITRATE 0.05 MG/ML IJ SOLN
INTRAMUSCULAR | Status: AC
  Filled 2014-02-24: qty 4

## 2014-02-24 MED ORDER — CEFAZOLIN SODIUM-DEXTROSE 2-3 GM-% IV SOLR
INTRAVENOUS | Status: AC
  Filled 2014-02-24: qty 50

## 2014-02-24 MED ORDER — PROPOFOL 10 MG/ML IV BOLUS
INTRAVENOUS | Status: DC | PRN
  Administered 2014-02-24: 20 mg via INTRAVENOUS
  Administered 2014-02-24: 150 mg via INTRAVENOUS
  Administered 2014-02-24: 50 mg via INTRAVENOUS

## 2014-02-24 MED ORDER — OXYCODONE HCL 5 MG PO TABS
ORAL_TABLET | ORAL | Status: AC
  Filled 2014-02-24: qty 1

## 2014-02-24 MED ORDER — FLEET ENEMA 7-19 GM/118ML RE ENEM
1.0000 | ENEMA | Freq: Once | RECTAL | Status: AC
  Administered 2014-02-24: 1 via RECTAL

## 2014-02-24 MED ORDER — ONDANSETRON HCL 4 MG/2ML IJ SOLN
INTRAMUSCULAR | Status: DC | PRN
  Administered 2014-02-24: 4 mg via INTRAVENOUS

## 2014-02-24 MED ORDER — LIDOCAINE HCL (CARDIAC) 20 MG/ML IV SOLN
INTRAVENOUS | Status: DC | PRN
  Administered 2014-02-24: 50 mg via INTRAVENOUS

## 2014-02-24 MED ORDER — PROMETHAZINE HCL 25 MG/ML IJ SOLN: 6.2500 mg | INTRAMUSCULAR | Status: DC | PRN

## 2014-02-24 MED ORDER — MIDAZOLAM HCL 2 MG/2ML IJ SOLN
INTRAMUSCULAR | Status: AC
  Filled 2014-02-24: qty 2

## 2014-02-24 MED ORDER — MEPERIDINE HCL 25 MG/ML IJ SOLN: 6.2500 mg | INTRAMUSCULAR | Status: DC | PRN

## 2014-02-24 MED ORDER — EPHEDRINE SULFATE 50 MG/ML IJ SOLN
INTRAMUSCULAR | Status: DC | PRN
  Administered 2014-02-24: 10 mg via INTRAVENOUS

## 2014-02-24 MED ORDER — OXYCODONE HCL 5 MG/5ML PO SOLN: 5.0000 mg | Freq: Once | ORAL | Status: AC | PRN

## 2014-02-24 MED ORDER — SODIUM CHLORIDE 0.9 % IJ SOLN
INTRAMUSCULAR | Status: DC | PRN
  Administered 2014-02-24: 15 mL

## 2014-02-24 MED ORDER — FENTANYL CITRATE 0.05 MG/ML IJ SOLN
INTRAMUSCULAR | Status: DC | PRN
  Administered 2014-02-24: 50 ug via INTRAVENOUS
  Administered 2014-02-24: 100 ug via INTRAVENOUS
  Administered 2014-02-24: 50 ug via INTRAVENOUS

## 2014-02-24 MED ORDER — SODIUM CHLORIDE 0.9 % IJ SOLN
INTRAMUSCULAR | Status: AC
  Filled 2014-02-24: qty 20

## 2014-02-24 MED ORDER — DEXAMETHASONE SODIUM PHOSPHATE 4 MG/ML IJ SOLN
INTRAMUSCULAR | Status: DC | PRN
  Administered 2014-02-24: 10 mg via INTRAVENOUS

## 2014-02-24 MED ORDER — FLEET ENEMA 7-19 GM/118ML RE ENEM
ENEMA | RECTAL | Status: AC
  Filled 2014-02-24: qty 1

## 2014-02-24 SURGICAL SUPPLY — 43 items
BENZOIN TINCTURE PRP APPL 2/3 (GAUZE/BANDAGES/DRESSINGS) IMPLANT
BLADE 11 SAFETY STRL DISP (BLADE) IMPLANT
BLADE 15 SAFETY STRL DISP (BLADE) ×2 IMPLANT
BRIEF STRETCH FOR OB PAD LRG (UNDERPADS AND DIAPERS) IMPLANT
CANISTER SUCT 1200ML W/VALVE (MISCELLANEOUS) ×2 IMPLANT
COVER MAYO STAND STRL (DRAPES) IMPLANT
COVER TABLE BACK 60X90 (DRAPES) IMPLANT
DECANTER SPIKE VIAL GLASS SM (MISCELLANEOUS) IMPLANT
DRAPE PED LAPAROTOMY (DRAPES) IMPLANT
DRAPE UTILITY XL STRL (DRAPES) ×2 IMPLANT
DRSG PAD ABDOMINAL 8X10 ST (GAUZE/BANDAGES/DRESSINGS) ×2 IMPLANT
ELECT COATED BLADE 2.86 ST (ELECTRODE) ×2 IMPLANT
ELECT REM PT RETURN 9FT ADLT (ELECTROSURGICAL) ×2
ELECTRODE REM PT RTRN 9FT ADLT (ELECTROSURGICAL) ×1 IMPLANT
GLOVE BIO SURGEON STRL SZ7 (GLOVE) ×2 IMPLANT
GLOVE BIOGEL PI IND STRL 7.0 (GLOVE) ×1 IMPLANT
GLOVE BIOGEL PI IND STRL 8 (GLOVE) ×1 IMPLANT
GLOVE BIOGEL PI INDICATOR 7.0 (GLOVE) ×1
GLOVE BIOGEL PI INDICATOR 8 (GLOVE) ×1
GLOVE ECLIPSE 8.0 STRL XLNG CF (GLOVE) ×2 IMPLANT
GOWN STRL REUS W/ TWL LRG LVL3 (GOWN DISPOSABLE) ×2 IMPLANT
GOWN STRL REUS W/TWL LRG LVL3 (GOWN DISPOSABLE) ×2
HEMOSTAT SURGICEL 2X14 (HEMOSTASIS) IMPLANT
NEEDLE HYPO 25X1 1.5 SAFETY (NEEDLE) ×2 IMPLANT
PACK BASIN DAY SURGERY FS (CUSTOM PROCEDURE TRAY) ×2 IMPLANT
PACK LITHOTOMY IV (CUSTOM PROCEDURE TRAY) ×2 IMPLANT
PENCIL BUTTON HOLSTER BLD 10FT (ELECTRODE) ×2 IMPLANT
SHEARS HARMONIC 9CM CVD (BLADE) ×2 IMPLANT
SLEEVE SCD COMPRESS KNEE MED (MISCELLANEOUS) ×2 IMPLANT
SPONGE GAUZE 4X4 12PLY STER LF (GAUZE/BANDAGES/DRESSINGS) IMPLANT
SPONGE SURGIFOAM ABS GEL 100 (HEMOSTASIS) IMPLANT
SURGILUBE 2OZ TUBE FLIPTOP (MISCELLANEOUS) ×2 IMPLANT
SUT MON AB 3-0 SH 27 (SUTURE) ×2
SUT MON AB 3-0 SH27 (SUTURE) ×2 IMPLANT
SYR CONTROL 10ML LL (SYRINGE) ×2 IMPLANT
TAPE CLOTH 3X10 TAN LF (GAUZE/BANDAGES/DRESSINGS) IMPLANT
TAPE CLOTH SURG 6X10 WHT LF (GAUZE/BANDAGES/DRESSINGS) IMPLANT
TOWEL OR 17X24 6PK STRL BLUE (TOWEL DISPOSABLE) ×2 IMPLANT
TOWEL OR NON WOVEN STRL DISP B (DISPOSABLE) ×2 IMPLANT
TRAY DSU PREP LF (CUSTOM PROCEDURE TRAY) ×2 IMPLANT
TUBE CONNECTING 20X1/4 (TUBING) ×2 IMPLANT
UNDERPAD 30X30 INCONTINENT (UNDERPADS AND DIAPERS) ×2 IMPLANT
YANKAUER SUCT BULB TIP NO VENT (SUCTIONS) ×2 IMPLANT

## 2014-02-24 NOTE — Anesthesia Procedure Notes (Signed)
Procedure Name: LMA Insertion Date/Time: 02/24/2014 9:00 AM Performed by: Lyndee Leo Pre-anesthesia Checklist: Patient identified, Emergency Drugs available, Suction available and Patient being monitored Patient Re-evaluated:Patient Re-evaluated prior to inductionOxygen Delivery Method: Circle System Utilized Preoxygenation: Pre-oxygenation with 100% oxygen Intubation Type: IV induction Ventilation: Mask ventilation without difficulty LMA: LMA inserted LMA Size: 5.0 Number of attempts: 1 Airway Equipment and Method: bite block Placement Confirmation: positive ETCO2 Tube secured with: Tape Dental Injury: Teeth and Oropharynx as per pre-operative assessment

## 2014-02-24 NOTE — Anesthesia Preprocedure Evaluation (Addendum)
Anesthesia Evaluation  Patient identified by MRN, date of birth, ID band Patient awake    Reviewed: Allergy & Precautions, H&P , NPO status , Patient's Chart, lab work & pertinent test results  History of Anesthesia Complications Negative for: history of anesthetic complications  Airway Mallampati: I TM Distance: >3 FB Neck ROM: Full    Dental  (+) Edentulous Upper, Edentulous Lower   Pulmonary COPDCurrent Smoker,  breath sounds clear to auscultation        Cardiovascular hypertension, Pt. on medications - angina+ CAD, + Past MI ('03), + Cardiac Stents (multiple stents) and + CABG ('11) Rhythm:Regular Rate:Normal  '11 ECHO: EF 09-62%, grade 2 diastolic dysfunction, mild MR   Neuro/Psych negative neurological ROS     GI/Hepatic Neg liver ROS, GERD-  Medicated and Controlled,  Endo/Other  diabetes (glu 116), Insulin Dependent, Oral Hypoglycemic AgentsMorbid obesity  Renal/GU Renal InsufficiencyRenal disease (creat 1.35)     Musculoskeletal   Abdominal (+) + obese,   Peds  Hematology   Anesthesia Other Findings   Reproductive/Obstetrics                         Anesthesia Physical Anesthesia Plan  ASA: III  Anesthesia Plan: General   Post-op Pain Management:    Induction: Intravenous  Airway Management Planned: LMA  Additional Equipment:   Intra-op Plan:   Post-operative Plan:   Informed Consent: I have reviewed the patients History and Physical, chart, labs and discussed the procedure including the risks, benefits and alternatives for the proposed anesthesia with the patient or authorized representative who has indicated his/her understanding and acceptance.   Dental advisory given  Plan Discussed with: Surgeon and CRNA  Anesthesia Plan Comments: (Plan routine monitors, GA- LMA OK)        Anesthesia Quick Evaluation

## 2014-02-24 NOTE — H&P (Signed)
Transplants    None    Demographics [name] 58 year old male  [address]  [place] [address]  Comm Pref:   [address]  [place] [address] Works at [employer]   Problem ListHospitalization ProblemNon-Hospital  ERECTILE DYSFUNCTION, NON-ORGANIC  Arteriosclerotic cardiovascular disease (ASCVD)  Diabetes mellitus, type II  Gastroesophageal reflux disease  Hyperlipidemia  Hypertension  Chronic kidney disease  Tobacco abuse, in remission  Hemorrhoids  Significant History/Details  Smoking: Former Smoker (Quit Date:02/06/2010), 1 ppd, 30 pack-years  Smokeless Tobacco: Never Used  Alcohol: No  3 open orders  Preferred Language: English  Dialysis HistoryNone   Currently admitted as of 5/19/2015Specialty CommentsEditShow AllReport04/13/15:PT SIGNED [name] 01/05/1956 DOS: 02/24/14 TC-[facility]-OP-hems/gen [id number] [name] 02/04/14 pt scheduled for op surgery 37/34/28 @ [facility] no precert required for cpt code given per Pearletha Alfred [phone] call ref #[id number].(chm,kh)    MedicationsHospital Medications Outpatient Medications  ceFAZolin (ANCEF) IVPB 2 g/50 mL premix  fentaNYL (SUBLIMAZE) injection 50-100 mcg  lactated ringers infusion  midazolam (VERSED) injection 1-2 mg    Preferred Labs   None   Transplant-Related Biopsies (11 years) ** None **  Patient Blood Type (50 years)   None                                 Recent Visits (Maximum of 10 visits)Date Type Provider Description  02/24/2014 Surgery [name] Odem A., MD   01/19/2014 Office Visit [name] Luna A., MD Hemorrhoids (Primary Dx)         My Last Outpatient Progress NoteStatus Last Edited Encounter Date  Signed Mon Jan 19, 2014 12:09 PM EDT 01/19/2014  Patient ID: [name], male   DOB: 07-29-1956, 58 y.o.   MRN: [id number]    Chief Complaint   Patient presents with   .  New Evaluation       eval hems      HPI [name] is a 58 y.o. male.  Patient sent at the request  of Dr. Olevia [name] for hemorrhoid disease. Patient has multiple year history of rectal bleeding, rectal pain, and prolapsed hemorrhoids. He has tried multiple medical treatments without success. He has bleeding when he wipes a small amount in the commode. Had colonoscopy in 2012 which showed this. The symptoms are fairly constant. The bleeding waxes and wanes become more constant after bowel movements. He does get diarrhea from his diabetes medications. HPI    Past Medical History   Diagnosis  Date   .  Arteriosclerotic cardiovascular disease (ASCVD)  2003       Non-Q MI with stent to RCA in 12/2001, normal EF, additional stents to mid CX and D1; cath in 02/2005-progressive or eye disease, 50-70% mid LAD, 90% distal LAD, 50% D1, 60-70% CX, small PDA with diffuse disease, patent RCA stent; repeat cath in 01/2008-no significant progression; non-Q MI in 02/2010 with 100% mid Cx at  prior stent, nondom. RCA with patent stent, 50% RI, nl EF, CABG-04/2010   .  Diabetes mellitus         no insulin; patient recalls A1c of 6 in 2010   .  Gastroesophageal reflux disease     .  Hyperlipidemia         Predominantly elevated triglycerides   .  Hypertension     .  Anxiety     .  Erectile dysfunction     .  Chronic obstructive pulmonary disease     .  Chronic kidney disease         Creatinine of 1.44 in 04/2009   .  Tobacco abuse, in remission         35 pack years; discontinued 05/2010   .  Abnormal LFTs     .  Pneumonia  2006       2006   .  Adenomatous colon polyp  2012   .  Internal hemorrhoids     .  Hepatic steatosis         Past Surgical History   Procedure  Laterality  Date   .  Appendectomy       .  Coronary artery bypass graft    05/2010       4 vessels August 2011   .  Colonoscopy w/ polypectomy    2012       Dr. Olevia [name]; 2 polyps removed one of which was precancerous       Family History   Problem  Relation  Age of Onset   .  Cirrhosis  Mother         + Sister   .  Heart disease  Father      .  Pancreatic cancer  Brother        Social History History   Substance Use Topics   .  Smoking status:  Former Smoker -- 1.00 packs/day for 30 years       Types:  Cigarettes       Quit date:  02/06/2010   .  Smokeless tobacco:  Never Used   .  Alcohol Use:  No       Allergies   Allergen  Reactions   .  Ranexa [Ranolazine]  Rash   .  Wellbutrin [Bupropion Hcl]  Rash       Current Outpatient Prescriptions   Medication  Sig  Dispense  Refill   .  amLODipine (NORVASC) 5 MG tablet  Take 1 tablet (5 mg total) by mouth daily.   90 tablet   6   .  aspirin EC 81 MG tablet  Take 81 mg by mouth daily.         Marland Kitchen  EPINEPHrine (EPI-PEN) 0.3 mg/0.3 mL SOAJ injection  Inject 0.3 mg into the muscle as needed.         .  Flaxseed, Linseed, 1200 MG CAPS  Take 2,400 mg by mouth daily.         .  hydrOXYzine (ATARAX/VISTARIL) 25 MG tablet  Take 1 or 2 po Q 6hrs prn itching or rash   60 tablet   0   .  insulin glargine (LANTUS SOLOSTAR) 100 UNIT/ML injection  Inject 50 Units into the skin 2 (two) times daily.         .  Lidocaine-Hydrocortisone Ace 2-2 % KIT  1 application to rectum twice daily   1 each   1   .  lisinopril-hydrochlorothiazide (PRINZIDE,ZESTORETIC) 10-12.5 MG per tablet  Take 1 tablet by mouth daily.         .  metFORMIN (GLUCOPHAGE) 1000 MG tablet  Take 1,000 mg by mouth 2 (two) times daily with a meal.           .  Multiple Vitamins-Minerals (MULTIVITAMIN WITH MINERALS) tablet  Take 1 tablet by mouth daily.           .  pravastatin (PRAVACHOL) 40 MG tablet  TAKE 1 TABLET (  40 MG TOTAL) DAILY   90 tablet   2   .  predniSONE (DELTASONE) 10 MG tablet  Take 10 mg by mouth as needed.         .  ranitidine (ZANTAC) 75 MG tablet  Take 75 mg by mouth 2 (two) times daily.             No current facility-administered medications for this visit.      Review of Systems Review of Systems  Constitutional: Negative for fever, chills and unexpected weight change.  HENT: Negative for  congestion, hearing loss, sore throat, trouble swallowing and voice change.   Eyes: Negative for visual disturbance.  Respiratory: Negative for cough and wheezing.   Cardiovascular: Negative for chest pain, palpitations and leg swelling.  Gastrointestinal: Positive for diarrhea, anal bleeding and rectal pain. Negative for nausea, vomiting, abdominal pain, constipation, blood in stool and abdominal distention.  Genitourinary: Negative for hematuria and difficulty urinating.  Musculoskeletal: Negative for arthralgias.  Skin: Negative for rash and wound.  Neurological: Negative for seizures, syncope, weakness and headaches.  Hematological: Negative for adenopathy. Does not bruise/bleed easily.  Psychiatric/Behavioral: Negative for confusion.    Blood pressure 148/84, pulse 82, temperature 97.9 F (36.6 C), temperature source Temporal, resp. rate 14, height 5' 7"  (1.702 m), weight 186 lb 3.2 oz (84.46 kg).   Physical Exam Physical Exam  Constitutional: He appears well-developed and well-nourished.  HENT:   Head: Normocephalic and atraumatic.  Eyes: EOM are normal.  Neck: Normal range of motion. Neck supple.  Cardiovascular: Normal rate and regular rhythm.   Pulmonary/Chest: Effort normal and breath sounds normal.  Genitourinary:       Data Reviewed Dr [name] notes/ colonoscopy 2012   Assessment Grade 3         3 column internal and external hemorrhoid disease   Plan Discussed options of medical, office-based or outpatient  surgery based procedures.  I do not feel he is a good candidate for banding or sclerotherapy given the size and extent of internal and external disease. Discussed hemorrhoidectomy with him. He is not a good PPH candidate as well. Recommend 3 column hemorrhoidectomy. Risk of bleeding, infection, stenosis, injury to neighboring structures, injury to sphincter, incontinence, DVT, death, the need for other operative procedures, discussed. He agrees to  proceed.       Taquisha Phung A. Trust Crago

## 2014-02-24 NOTE — Transfer of Care (Signed)
Immediate Anesthesia Transfer of Care Note  Patient: [name]  Procedure(s) Performed: Procedure(s): HEMORRHOIDECTOMY (N/A)  Patient Location: PACU  Anesthesia Type:General  Level of Consciousness: awake, sedated and obtunded  Airway & Oxygen Therapy: Patient Spontanous Breathing and Patient connected to face mask oxygen  Post-op Assessment: Report given to PACU RN and Post -op Vital signs reviewed and stable  Post vital signs: Reviewed and stable  Complications: No apparent anesthesia complications

## 2014-02-24 NOTE — Op Note (Signed)
Preoperative diagnosis: Grade 3 prolapsed internal hemorrhoids 3 columns  Postoperative diagnosis: Same  Procedure: 3 column internal and external hemorrhoidectomy  Surgeon: Erroll Luna M.D.  Anesthesia: LMA with local anesthesia  EBL: Less than 50 cc  Specimens: Hemorrhoid tissue  Drains: None  IV fluids:400 cc  Indications for procedure: The patient presents with symptomatic hemorrhoid disease. Options of treatment including medical management, office-based procedures and surgical modalities. All have been discussed with the patient. The patient has  failed medical management. They wish to proceed with formal hemorrhoidectomy. Risks, benefits and all alternative therapies with complications of long term expectations discussed. They agree to proceed with surgery. The procedure has been discussed with the patient.  Alternative therapies have been discussed with the patient.  Operative risks include bleeding,  Infection,  Organ injury, incontinence,    Nerve injury,  Blood vessel injury,  DVT,  Pulmonary embolism,  Death,  And possible reoperation.  Medical management risks include worsening of present situation.  The success of the procedure is 50 -90 % at treating patients symptoms.  The patient understands and agrees to proceed.  Description of procedure: The patient was met in the holding area and questions are answered. The patient was taken back to the operating room and placed upon the operating room table. After induction of LMA anesthesia, the patient was placed lithotomy and appropriate the padded. The anal canal and perineum were prepped and draped in a sterile fashion. Timeout was done and the patient received preoperative antibiotics. Harmonic scalpel was used after digital examination to excise  3 columns of internal and external hemorrhoid tissue. The Internal and external  sphincter were preserved. The mucosa was reapproximated with 3-0 Monocryl. All 3 columns were treated in a  similar fashion. Hemostasis achieved. Irrigation used. No significant narrowing of the anal canal noted. Gelfoam used  For packing. Local anesthesia  Of Exparel 20 cc/20 cc saline  used and infiltrated for perianal block. All final counts are found to be correct. The patient was taken to lithotomy, extubated and transported to recovery in satisfactory condition.

## 2014-02-24 NOTE — Discharge Instructions (Signed)
[facility] _______[facility] ° °RECTAL SURGERY POST OP INSTRUCTIONS: POST OP INSTRUCTIONS ° °Always review your discharge instruction sheet given to you by the facility where your surgery was performed. °IF YOU HAVE DISABILITY OR FAMILY LEAVE FORMS, YOU MUST BRING THEM TO THE OFFICE FOR PROCESSING.   °DO NOT GIVE THEM TO YOUR DOCTOR. ° °1. A  prescription for pain medication may be given to you upon discharge.  Take your pain medication as prescribed, if needed.  If narcotic pain medicine is not needed, then you may take acetaminophen (Tylenol) or ibuprofen (Advil) as needed. °2. Take your usually prescribed medications unless otherwise directed. °3. If you need a refill on your pain medication, please contact your pharmacy.  They will contact our office to request authorization. Prescriptions will not be filled after 5 pm or on week-ends. °4. You should follow a light diet the first 48 hours after arrival home, such as soup and crackers, etc.  Be sure to include lots of fluids daily.  Resume your normal diet 2-3 days after surgery.. °5. Most patients will experience some swelling and discomfort in the rectal area. Ice packs, reclining and warm tub soaks will help.  Swelling and discomfort can take several days to resolve.  °6. It is common to experience some constipation if taking pain medication after surgery.  Increasing fluid intake and taking a stool softener (such as Colace) will usually help or prevent this problem from occurring.  A mild laxative (Milk of Magnesia or Miralax) should be taken according to package directions if there are no bowel movements after 48 hours. °7. Unless discharge instructions indicate otherwise, leave your bandage dry and in place for 24 hours, or remove the bandage if you have a bowel movement. You may notice a small amount of bleeding with bowel movements for the first few days. You may have some packing in the rectum which will come out over the first day or two. You  will need to wear an absorbent pad or soft cotton gauze in your underwear until the drainage stops.it. °8. ACTIVITIES:  You may resume regular (light) daily activities beginning the next day--such as daily self-care, walking, climbing stairs--gradually increasing activities as tolerated.  You may have sexual intercourse when it is comfortable.  Refrain from any heavy lifting or straining until approved by your doctor. °a. You may drive when you are no longer taking prescription pain medication, you can comfortably wear a seatbelt, and you can safely maneuver your car and apply brakes. °b. RETURN TO WORK: : ____________________ °c.  °9. You should see your doctor in the office for a follow-up appointment approximately 2-3 weeks after your surgery.  Make sure that you call for this appointment within a day or two after you arrive home to insure a convenient appointment time. °10. OTHER INSTRUCTIONS:  __________________________________________________________________________________________________________________________________________________________________________________________  °WHEN TO CALL YOUR DOCTOR: °1. Fever over 101.0 °2. Inability to urinate °3. Nausea and/or vomiting °4. Extreme swelling or bruising °5. Continued bleeding from rectum. °6. Increased pain, redness, or drainage from the incision °7. Constipation ° °The clinic staff is available to answer your questions during regular business hours.  Please don’t hesitate to call and ask to speak to one of the nurses for clinical concerns.  If you have a medical emergency, go to the nearest emergency room or call 911.  A surgeon from [facility] is always on call at the hospital ° ° °[address], [address], [place], [place]  [address] ? °   [id number], [place], [place]   [address] [phone] ? [phone] ? FAX [phone] Web site: [email or web address]     Post Anesthesia Home Care Instructions  Activity: Get  plenty of rest for the remainder of the day. A responsible adult should stay with you for 24 hours following the procedure.  For the next 24 hours, DO NOT: -Drive a car -Paediatric nurse -Drink alcoholic beverages -Take any medication unless instructed by your physician -Make any legal decisions or sign important papers.  Meals: Start with liquid foods such as gelatin or soup. Progress to regular foods as tolerated. Avoid greasy, spicy, heavy foods. If nausea and/or vomiting occur, drink only clear liquids until the nausea and/or vomiting subsides. Call your physician if vomiting continues.  Special Instructions/Symptoms: Your throat may feel dry or sore from the anesthesia or the breathing tube placed in your throat during surgery. If this causes discomfort, gargle with warm salt water. The discomfort should disappear within 24 hours.   Information for Discharge Teaching: EXPAREL (bupivacaine liposome injectable suspension)   Your surgeon gave you EXPAREL(bupivacaine) in your surgical incision to help control your pain after surgery.   EXPAREL is a local anesthetic that provides pain relief by numbing the tissue around the surgical site.  EXPAREL is designed to release pain medication over time and can control pain for up to 72 hours.  Depending on how you respond to EXPAREL, you may require less pain medication during your recovery.  Possible side effects:  Temporary loss of sensation or ability to move in the area where bupivacaine was injected.  Nausea, vomiting, constipation  Rarely, numbness and tingling in your mouth or lips, lightheadedness, or anxiety may occur.  Call your doctor right away if you think you may be experiencing any of these sensations, or if you have other questions regarding possible side effects.  Follow all other discharge instructions given to you by your surgeon or nurse. Eat a healthy diet and drink plenty of water or other fluids.  If you return  to the hospital for any reason within 96 hours following the administration of EXPAREL, please inform your health care providers.   Call your surgeon if you experience:   1.  Fever over 101.0. 2.  Inability to urinate. 3.  Nausea and/or vomiting. 4.  Extreme swelling or bruising at the surgical site. 5.  Continued bleeding from the incision. 6.  Increased pain, redness or drainage from the incision. 7.  Problems related to your pain medication.

## 2014-02-24 NOTE — Anesthesia Postprocedure Evaluation (Signed)
  Anesthesia Post-op Note  Patient: [name]  Procedure(s) Performed: Procedure(s): HEMORRHOIDECTOMY (N/A)  Patient Location: PACU  Anesthesia Type:General  Level of Consciousness: awake, alert , oriented and patient cooperative  Airway and Oxygen Therapy: Patient Spontanous Breathing  Post-op Pain: none  Post-op Assessment: Post-op Vital signs reviewed, Patient's Cardiovascular Status Stable, Respiratory Function Stable, Patent Airway, No signs of Nausea or vomiting and Pain level controlled  Post-op Vital Signs: Reviewed and stable  Last Vitals:  Filed Vitals:   02/24/14 1130  BP: 146/74  Pulse: 84  Temp: 36.4 C  Resp: 16    Complications: bladder dysfunction: Dr. Brantley Stage managing, otherwise no apparent anesthetic complications

## 2014-02-24 NOTE — Telephone Encounter (Signed)
LMOM> Pt needs nurse appt for Wed morning for foley cath removal.

## 2014-02-24 NOTE — Telephone Encounter (Signed)
Message copied by Carlene Coria on Tue Feb 24, 2014  2:04 PM ------      Message from: Erroll Luna A      Created: Tue Feb 24, 2014 12:56 PM       Has foley.  Needs to come out wed in am in office.  Please call today.       thx      TC ------

## 2014-02-25 ENCOUNTER — Telehealth (INDEPENDENT_AMBULATORY_CARE_PROVIDER_SITE_OTHER): Payer: Self-pay

## 2014-02-25 NOTE — Telephone Encounter (Signed)
2nd attempt to reach pt to make nurse appt for foley removal.

## 2014-02-25 NOTE — Telephone Encounter (Signed)
Message copied by Carlene Coria on Wed Feb 25, 2014  8:55 AM ------      Message from: Erroll Luna A      Created: Tue Feb 24, 2014 12:56 PM       Has foley.  Needs to come out wed in am in office.  Please call today.       thx      TC ------

## 2014-02-25 NOTE — Telephone Encounter (Signed)
Called pt again. He states he was able to urinate before he left the hospital so no foley was placed. He can disregard previous voice mails I left. Gave him po appt info.

## 2014-02-26 ENCOUNTER — Encounter (HOSPITAL_BASED_OUTPATIENT_CLINIC_OR_DEPARTMENT_OTHER): Payer: Self-pay | Admitting: Surgery

## 2014-02-27 ENCOUNTER — Telehealth (INDEPENDENT_AMBULATORY_CARE_PROVIDER_SITE_OTHER): Payer: Self-pay

## 2014-02-27 NOTE — Telephone Encounter (Signed)
Pt s/p hemorrhoidectomy on 02/24/14. Pt states that he urinated yesterday afternoon, he felt at that time he emptied his bladder. Ever since then he has only dribbled and has been unable to empty his bladder. Spoke with Sharyn Lull and advised the pt he would need to go to the ER or Urgent Care to get a in and out cath or a foley. Pt verbalized understanding and agreed with POC.

## 2014-03-13 ENCOUNTER — Encounter (INDEPENDENT_AMBULATORY_CARE_PROVIDER_SITE_OTHER): Payer: Self-pay | Admitting: Surgery

## 2014-03-13 ENCOUNTER — Ambulatory Visit (INDEPENDENT_AMBULATORY_CARE_PROVIDER_SITE_OTHER): Payer: Commercial Managed Care - PPO | Admitting: Surgery

## 2014-03-13 VITALS — BP 140/76 | HR 84 | Resp 14 | Ht 64.0 in | Wt 182.0 lb

## 2014-03-13 DIAGNOSIS — Z9889 Other specified postprocedural states: Secondary | ICD-10-CM

## 2014-03-13 MED ORDER — OXYCODONE-ACETAMINOPHEN 10-325 MG PO TABS: 1.0000 | ORAL_TABLET | ORAL | Status: DC | PRN

## 2014-03-13 NOTE — Patient Instructions (Addendum)
May cut back miralax to every other day.  Add fiber supplement daily. May apply neosporin to anus daily as needed.   Return to work  03/16/2014

## 2014-03-13 NOTE — Progress Notes (Signed)
Patient returns 2 weeks after hemorrhoidectomy. He is doing better. He is still having some drainage and discharge the pain is much improved. Bowels are moving.  Exam: Anal canal healing well with good granulation tissue. No signs of infection.  Impression: Status post hemorrhoidectomy  Plan: Return to work Monday. Return to office in 3-4 weeks. Refilled Percocet and recommended Neosporin application to anus daily.

## 2014-04-17 ENCOUNTER — Ambulatory Visit (INDEPENDENT_AMBULATORY_CARE_PROVIDER_SITE_OTHER): Payer: Commercial Managed Care - PPO | Admitting: Surgery

## 2014-04-17 ENCOUNTER — Encounter (INDEPENDENT_AMBULATORY_CARE_PROVIDER_SITE_OTHER): Payer: Self-pay | Admitting: Surgery

## 2014-04-17 VITALS — BP 132/74 | HR 76 | Resp 14 | Ht 64.0 in | Wt 185.4 lb

## 2014-04-17 DIAGNOSIS — Z9889 Other specified postprocedural states: Secondary | ICD-10-CM

## 2014-04-17 NOTE — Progress Notes (Signed)
Patient returns 2 weeks after hemorrhoidectomy. He is doing better. He is still having some drainage and discharge the pain is much improved. Bowels are moving.  Exam: Anal canal healing well with good granulation tissue. No signs of infection.  Impression: Status post hemorrhoidectomy  Plan: RTC prn

## 2014-04-17 NOTE — Patient Instructions (Signed)

## 2014-04-27 ENCOUNTER — Other Ambulatory Visit: Payer: Self-pay | Admitting: Cardiovascular Disease

## 2014-09-14 NOTE — Patient Instructions (Signed)
Your procedure is scheduled on:  09/21/14  Report to [facility] at 09:30 AM.  Call this number if you have problems the morning of surgery: [phone]   Remember:   Do not eat food or drink liquids after midnight.   Take these medicines the morning of surgery with A SIP OF WATER:    Do not wear jewelry, make-up or nail polish.  Do not wear lotions, powders, or perfumes. You may wear deodorant.  Do not bring valuables to the hospital.  [facility] is not responsible for any belongings or valuables.               Contacts, dentures or bridgework may not be worn into surgery.               Patients discharged the day of surgery will not be allowed to drive home.   Special Instructions: Start using your eye drops prior to surgery as directed by your eye doctor.   Please read over the following fact sheets that you were given: Anesthesia Post-op Instructions and Care and Recovery After Surgery     Cataract Surgery  A cataract is a clouding of the lens of the eye. When a lens becomes cloudy, vision is reduced based on the degree and nature of the clouding. Surgery may be needed to improve vision. Surgery removes the cloudy lens and usually replaces it with a substitute lens (intraocular lens, IOL). LET YOUR EYE DOCTOR KNOW ABOUT:  Allergies to food or medicine.  Medicines taken including herbs, eyedrops, over-the-counter medicines, and creams.  Use of steroids (by mouth or creams).  Previous problems with anesthetics or numbing medicine.  History of bleeding problems or blood clots.  Previous surgery.  Other health problems, including diabetes and kidney problems.  Possibility of pregnancy, if this applies. RISKS AND COMPLICATIONS  Infection.  Inflammation of the eyeball (endophthalmitis) that can spread to both eyes (sympathetic ophthalmia).  Poor wound healing.  If an IOL is inserted, it can later fall out of proper position. This is very uncommon.  Clouding of the  part of your eye that holds an IOL in place. This is called an "after-cataract." These are uncommon, but easily treated. BEFORE THE PROCEDURE  Do not eat or drink anything except small amounts of water for 8 to 12 before your surgery, or as directed by your caregiver.  Unless you are told otherwise, continue any eyedrops you have been prescribed.  Talk to your primary caregiver about all other medicines that you take (both prescription and non-prescription). In some cases, you may need to stop or change medicines near the time of your surgery. This is most important if you are taking blood-thinning medicine.Do not stop medicines unless you are told to do so.  Arrange for someone to drive you to and from the procedure.  Do not put contact lenses in either eye on the day of your surgery. PROCEDURE There is more than one method for safely removing a cataract. Your doctor can explain the differences and help determine which is best for you. Phacoemulsification surgery is the most common form of cataract surgery.  An injection is given behind the eye or eyedrops are given to make this a painless procedure.  A small cut (incision) is made on the edge of the clear, dome-shaped surface that covers the front of the eye (cornea).  A tiny probe is painlessly inserted into the eye. This device gives off ultrasound waves that soften and break up the  cloudy center of the lens. This makes it easier for the cloudy lens to be removed by suction.  An IOL may be implanted.  The normal lens of the eye is covered by a clear capsule. Part of that capsule is intentionally left in the eye to support the IOL.  Your surgeon may or may not use stitches to close the incision. There are other forms of cataract surgery that require a larger incision and stiches to close the eye. This approach is taken in cases where the doctor feels that the cataract cannot be easily removed using phacoemulsification. AFTER THE  PROCEDURE  When an IOL is implanted, it does not need care. It becomes a permanent part of your eye and cannot be seen or felt.  Your doctor will schedule follow-up exams to check on your progress.  Review your other medicines with your doctor to see which can be resumed after surgery.  Use eyedrops or take medicine as prescribed by your doctor. Document Released: 09/14/2011 Document Revised: 12/18/2011 Document Reviewed: 09/14/2011 [facility] Patient Information 2013 [place].    PATIENT INSTRUCTIONS POST-ANESTHESIA  IMMEDIATELY FOLLOWING SURGERY:  Do not drive or operate machinery for the first twenty four hours after surgery.  Do not make any important decisions for twenty four hours after surgery or while taking narcotic pain medications or sedatives.  If you develop intractable nausea and vomiting or a severe headache please notify your doctor immediately.  FOLLOW-UP:  Please make an appointment with your surgeon as instructed. You do not need to follow up with anesthesia unless specifically instructed to do so.  WOUND CARE INSTRUCTIONS (if applicable):  Keep a dry clean dressing on the anesthesia/puncture wound site if there is drainage.  Once the wound has quit draining you may leave it open to air.  Generally you should leave the bandage intact for twenty four hours unless there is drainage.  If the epidural site drains for more than 36-48 hours please call the anesthesia department.  QUESTIONS?:  Please feel free to call your physician or the hospital operator if you have any questions, and they will be happy to assist you.

## 2014-09-15 ENCOUNTER — Encounter (HOSPITAL_COMMUNITY)
Admission: RE | Admit: 2014-09-15 | Discharge: 2014-09-15 | Disposition: A | Discharge status: Home / Self Care | Payer: 59 | Source: Ambulatory Visit | Attending: Ophthalmology | Admitting: Ophthalmology

## 2014-09-15 ENCOUNTER — Encounter (HOSPITAL_COMMUNITY): Payer: Self-pay

## 2014-09-15 DIAGNOSIS — Z01812 Encounter for preprocedural laboratory examination: Secondary | ICD-10-CM | POA: Insufficient documentation

## 2014-09-15 LAB — BASIC METABOLIC PANEL
Anion gap: 15 (ref 5–15)
BUN: 19 mg/dL (ref 6–23)
CALCIUM: 9.5 mg/dL (ref 8.4–10.5)
CO2: 22 meq/L (ref 19–32)
Chloride: 102 mEq/L (ref 96–112)
Creatinine, Ser: 1.29 mg/dL (ref 0.50–1.35)
GFR calc Af Amer: 68 mL/min — ABNORMAL LOW (ref >90)
GFR calc non Af Amer: 59 mL/min — ABNORMAL LOW (ref >90)
GLUCOSE: 139 mg/dL — AB (ref 70–99)
Potassium: 4.4 mEq/L (ref 3.7–5.3)
Sodium: 139 mEq/L (ref 137–147)

## 2014-09-15 LAB — HEMOGLOBIN AND HEMATOCRIT, BLOOD
HCT: 39 % (ref 39.0–52.0)
Hemoglobin: 12.5 g/dL — ABNORMAL LOW (ref 13.0–17.0)

## 2014-09-18 MED ORDER — CYCLOPENTOLATE-PHENYLEPHRINE OP SOLN OPTIME - NO CHARGE
OPHTHALMIC | Status: AC
  Filled 2014-09-18: qty 2

## 2014-09-18 MED ORDER — LIDOCAINE HCL 3.5 % OP GEL
OPHTHALMIC | Status: AC
  Filled 2014-09-18: qty 1

## 2014-09-18 MED ORDER — NEOMYCIN-POLYMYXIN-DEXAMETH 3.5-10000-0.1 OP SUSP
OPHTHALMIC | Status: AC
  Filled 2014-09-18: qty 5

## 2014-09-18 MED ORDER — PHENYLEPHRINE HCL 2.5 % OP SOLN
OPHTHALMIC | Status: AC
  Filled 2014-09-18: qty 15

## 2014-09-18 MED ORDER — LIDOCAINE HCL (PF) 1 % IJ SOLN
INTRAMUSCULAR | Status: AC
  Filled 2014-09-18: qty 2

## 2014-09-18 MED ORDER — TETRACAINE HCL 0.5 % OP SOLN
OPHTHALMIC | Status: AC
  Filled 2014-09-18: qty 2

## 2014-09-21 ENCOUNTER — Ambulatory Visit (HOSPITAL_COMMUNITY)
Admission: RE | Admit: 2014-09-21 | Discharge: 2014-09-21 | Disposition: A | Discharge status: Home / Self Care | Payer: 59 | Source: Ambulatory Visit | Attending: Ophthalmology | Admitting: Ophthalmology

## 2014-09-21 ENCOUNTER — Encounter (HOSPITAL_COMMUNITY)
Admission: RE | Disposition: A | Discharge status: Home / Self Care | Payer: Self-pay | Source: Ambulatory Visit | Attending: Ophthalmology

## 2014-09-21 ENCOUNTER — Encounter (HOSPITAL_COMMUNITY): Payer: Self-pay | Admitting: *Deleted

## 2014-09-21 ENCOUNTER — Ambulatory Visit (HOSPITAL_COMMUNITY): Payer: 59 | Admitting: Anesthesiology

## 2014-09-21 DIAGNOSIS — H25812 Combined forms of age-related cataract, left eye: Secondary | ICD-10-CM | POA: Insufficient documentation

## 2014-09-21 HISTORY — PX: CATARACT EXTRACTION W/PHACO: SHX586

## 2014-09-21 LAB — GLUCOSE, CAPILLARY: Glucose-Capillary: 107 mg/dL — ABNORMAL HIGH (ref 70–99)

## 2014-09-21 SURGERY — PHACOEMULSIFICATION, CATARACT, WITH IOL INSERTION
Anesthesia: Monitor Anesthesia Care | Site: Eye | Laterality: Left

## 2014-09-21 MED ORDER — PHENYLEPHRINE HCL 2.5 % OP SOLN
1.0000 [drp] | OPHTHALMIC | Status: AC
  Administered 2014-09-21 (×3): 1 [drp] via OPHTHALMIC

## 2014-09-21 MED ORDER — LIDOCAINE 3.5 % OP GEL OPTIME - NO CHARGE
OPHTHALMIC | Status: DC | PRN
  Administered 2014-09-21: 1 [drp] via OPHTHALMIC

## 2014-09-21 MED ORDER — CYCLOPENTOLATE-PHENYLEPHRINE 0.2-1 % OP SOLN
1.0000 [drp] | OPHTHALMIC | Status: AC
  Administered 2014-09-21 (×3): 1 [drp] via OPHTHALMIC

## 2014-09-21 MED ORDER — EPINEPHRINE HCL 1 MG/ML IJ SOLN
INTRAMUSCULAR | Status: AC
  Filled 2014-09-21: qty 1

## 2014-09-21 MED ORDER — FENTANYL CITRATE 0.05 MG/ML IJ SOLN
25.0000 ug | INTRAMUSCULAR | Status: AC
  Administered 2014-09-21 (×2): 25 ug via INTRAVENOUS
  Filled 2014-09-21: qty 2

## 2014-09-21 MED ORDER — EPINEPHRINE HCL 1 MG/ML IJ SOLN
INTRAOCULAR | Status: DC | PRN
  Administered 2014-09-21: 12:00:00

## 2014-09-21 MED ORDER — PROVISC 10 MG/ML IO SOLN
INTRAOCULAR | Status: DC | PRN
  Administered 2014-09-21: 0.85 mL via INTRAOCULAR

## 2014-09-21 MED ORDER — LIDOCAINE HCL (PF) 1 % IJ SOLN
INTRAMUSCULAR | Status: DC | PRN
  Administered 2014-09-21: .5 mL

## 2014-09-21 MED ORDER — BSS IO SOLN
INTRAOCULAR | Status: DC | PRN
  Administered 2014-09-21: 30 mL via INTRAOCULAR

## 2014-09-21 MED ORDER — LACTATED RINGERS IV SOLN
INTRAVENOUS | Status: DC
  Administered 2014-09-21: 11:00:00 via INTRAVENOUS

## 2014-09-21 MED ORDER — NEOMYCIN-POLYMYXIN-DEXAMETH 3.5-10000-0.1 OP SUSP
OPHTHALMIC | Status: DC | PRN
  Administered 2014-09-21: 1 [drp] via OPHTHALMIC

## 2014-09-21 MED ORDER — TETRACAINE HCL 0.5 % OP SOLN
1.0000 [drp] | OPHTHALMIC | Status: AC
  Administered 2014-09-21 (×3): 1 [drp] via OPHTHALMIC

## 2014-09-21 MED ORDER — LACTATED RINGERS IV SOLN
INTRAVENOUS | Status: DC | PRN
  Administered 2014-09-21: 11:00:00 via INTRAVENOUS

## 2014-09-21 MED ORDER — MIDAZOLAM HCL 2 MG/2ML IJ SOLN
1.0000 mg | INTRAMUSCULAR | Status: DC | PRN
  Administered 2014-09-21: 2 mg via INTRAVENOUS
  Filled 2014-09-21: qty 2

## 2014-09-21 MED ORDER — LIDOCAINE HCL 3.5 % OP GEL
1.0000 "application " | Freq: Once | OPHTHALMIC | Status: AC
  Administered 2014-09-21: 1 via OPHTHALMIC

## 2014-09-21 MED ORDER — POVIDONE-IODINE 5 % OP SOLN
OPHTHALMIC | Status: DC | PRN
  Administered 2014-09-21: 1 via OPHTHALMIC

## 2014-09-21 SURGICAL SUPPLY — 33 items
CAPSULAR TENSION RING-AMO (OPHTHALMIC RELATED) IMPLANT
CLOTH BEACON ORANGE TIMEOUT ST (SAFETY) ×2 IMPLANT
EYE SHIELD UNIVERSAL CLEAR (GAUZE/BANDAGES/DRESSINGS) ×2 IMPLANT
GLOVE BIO SURGEON STRL SZ 6.5 (GLOVE) IMPLANT
GLOVE BIOGEL PI IND STRL 6.5 (GLOVE) ×2 IMPLANT
GLOVE BIOGEL PI IND STRL 7.0 (GLOVE) IMPLANT
GLOVE BIOGEL PI IND STRL 7.5 (GLOVE) IMPLANT
GLOVE BIOGEL PI INDICATOR 6.5 (GLOVE) ×2
GLOVE BIOGEL PI INDICATOR 7.0 (GLOVE)
GLOVE BIOGEL PI INDICATOR 7.5 (GLOVE)
GLOVE ECLIPSE 6.5 STRL STRAW (GLOVE) IMPLANT
GLOVE ECLIPSE 7.0 STRL STRAW (GLOVE) IMPLANT
GLOVE ECLIPSE 7.5 STRL STRAW (GLOVE) IMPLANT
GLOVE EXAM NITRILE LRG STRL (GLOVE) IMPLANT
GLOVE EXAM NITRILE MD LF STRL (GLOVE) IMPLANT
GLOVE SKINSENSE NS SZ6.5 (GLOVE)
GLOVE SKINSENSE NS SZ7.0 (GLOVE)
GLOVE SKINSENSE STRL SZ6.5 (GLOVE) IMPLANT
GLOVE SKINSENSE STRL SZ7.0 (GLOVE) IMPLANT
KIT VITRECTOMY (OPHTHALMIC RELATED) IMPLANT
PAD ARMBOARD 7.5X6 YLW CONV (MISCELLANEOUS) ×2 IMPLANT
PROC W NO LENS (INTRAOCULAR LENS)
PROC W SPEC LENS (INTRAOCULAR LENS)
PROCESS W NO LENS (INTRAOCULAR LENS) IMPLANT
PROCESS W SPEC LENS (INTRAOCULAR LENS) IMPLANT
RETRACTOR IRIS SIGHTPATH (OPHTHALMIC RELATED) IMPLANT
RING MALYGIN (MISCELLANEOUS) IMPLANT
SIGHTPATH CAT PROC W REG LENS (Ophthalmic Related) ×2 IMPLANT
SYRINGE LUER LOK 1CC (MISCELLANEOUS) ×2 IMPLANT
TAPE SURG TRANSPORE 1 IN (GAUZE/BANDAGES/DRESSINGS) ×1 IMPLANT
TAPE SURGICAL TRANSPORE 1 IN (GAUZE/BANDAGES/DRESSINGS) ×1
VISCOELASTIC ADDITIONAL (OPHTHALMIC RELATED) IMPLANT
WATER STERILE IRR 250ML POUR (IV SOLUTION) ×2 IMPLANT

## 2014-09-21 NOTE — Anesthesia Postprocedure Evaluation (Signed)
  Anesthesia Post-op Note  Patient: [name]  Procedure(s) Performed: Procedure(s) with comments: CATARACT EXTRACTION PHACO AND INTRAOCULAR LENS PLACEMENT (IOC) (Left) - CDE:9.77  Patient Location: Short Stay  Anesthesia Type:MAC  Level of Consciousness: awake, alert , oriented and patient cooperative  Airway and Oxygen Therapy: Patient Spontanous Breathing  Post-op Pain: none  Post-op Assessment: Post-op Vital signs reviewed, Patient's Cardiovascular Status Stable, Respiratory Function Stable, Patent Airway, No signs of Nausea or vomiting and Pain level controlled  Post-op Vital Signs: Reviewed and stable  Last Vitals:  Filed Vitals:   09/21/14 1125  BP: 136/78  Pulse:   Temp:   Resp: 13    Complications: No apparent anesthesia complications

## 2014-09-21 NOTE — Anesthesia Preprocedure Evaluation (Signed)
Anesthesia Evaluation  Patient identified by MRN, date of birth, ID band Patient awake    Reviewed: Allergy & Precautions, H&P , NPO status , Patient's Chart, lab work & pertinent test results  Airway Mallampati: II  TM Distance: >3 FB     Dental  (+) Edentulous Upper, Edentulous Lower   Pulmonary COPDformer smoker,  breath sounds clear to auscultation        Cardiovascular hypertension, Pt. on medications + CAD, + Past MI and + Cardiac Stents Rhythm:Regular Rate:Normal     Neuro/Psych PSYCHIATRIC DISORDERS Anxiety    GI/Hepatic GERD-  Controlled and Medicated,  Endo/Other  diabetes, Type 2, Insulin Dependent  Renal/GU Renal InsufficiencyRenal disease     Musculoskeletal   Abdominal   Peds  Hematology   Anesthesia Other Findings   Reproductive/Obstetrics                             Anesthesia Physical Anesthesia Plan  ASA: III  Anesthesia Plan: MAC   Post-op Pain Management:    Induction: Intravenous  Airway Management Planned: Nasal Cannula  Additional Equipment:   Intra-op Plan:   Post-operative Plan:   Informed Consent: I have reviewed the patients History and Physical, chart, labs and discussed the procedure including the risks, benefits and alternatives for the proposed anesthesia with the patient or authorized representative who has indicated his/her understanding and acceptance.     Plan Discussed with:   Anesthesia Plan Comments:         Anesthesia Quick Evaluation

## 2014-09-21 NOTE — Discharge Instructions (Signed)

## 2014-09-21 NOTE — Op Note (Signed)
Date of Admission: 09/21/2014  Date of Surgery: 09/21/2014   Pre-Op Dx: Cataract Left Eye  Post-Op Dx: Senile Combined Cataract Left  Eye,  Dx Code I69.629  Surgeon: Tonny Branch, M.D.  Assistants: None  Anesthesia: Topical with MAC  Indications: Painless, progressive loss of vision with compromise of daily activities.  Surgery: Cataract Extraction with Intraocular lens Implant Left Eye  Discription: The patient had dilating drops and viscous lidocaine placed into the Left eye in the pre-op holding area. After transfer to the operating room, a time out was performed. The patient was then prepped and draped. Beginning with a 58 degree blade a paracentesis port was made at the surgeon's 2 o'clock position. The anterior chamber was then filled with 1% non-preserved lidocaine. This was followed by filling the anterior chamber with Provisc.  A 2.73mm keratome blade was used to make a clear corneal incision at the temporal limbus.  A bent cystatome needle was used to create a continuous tear capsulotomy. Hydrodissection was performed with balanced salt solution on a Fine canula. The lens nucleus was then removed using the phacoemulsification handpiece. Residual cortex was removed with the I&A handpiece. The anterior chamber and capsular bag were refilled with Provisc. A posterior chamber intraocular lens was placed into the capsular bag with it's injector. The implant was positioned with the Kuglan hook. The Provisc was then removed from the anterior chamber and capsular bag with the I&A handpiece. Stromal hydration of the main incision and paracentesis port was performed with BSS on a Fine canula. The wounds were tested for leak which was negative. The patient tolerated the procedure well. There were no operative complications. The patient was then transferred to the recovery room in stable condition.  Complications: None  Specimen: None  EBL: None  Prosthetic device: Hoya iSert 250, power 19.0 D,  SN [id number].

## 2014-09-21 NOTE — Anesthesia Procedure Notes (Signed)
Procedure Name: MAC Date/Time: 09/21/2014 11:28 AM Performed by: Andree Elk, AMY A Pre-anesthesia Checklist: Patient identified, Timeout performed, Emergency Drugs available, Suction available and Patient being monitored Oxygen Delivery Method: Nasal cannula

## 2014-09-21 NOTE — Transfer of Care (Signed)
Immediate Anesthesia Transfer of Care Note  Patient: [name]  Procedure(s) Performed: Procedure(s) with comments: CATARACT EXTRACTION PHACO AND INTRAOCULAR LENS PLACEMENT (IOC) (Left) - CDE:9.77  Patient Location: Short Stay  Anesthesia Type:MAC  Level of Consciousness: awake, alert , oriented and patient cooperative  Airway & Oxygen Therapy: Patient Spontanous Breathing  Post-op Assessment: Report given to PACU RN and Post -op Vital signs reviewed and stable  Post vital signs: Reviewed and stable  Complications: No apparent anesthesia complications

## 2014-09-21 NOTE — H&P (Signed)
I have reviewed the H&P, the patient was re-examined, and I have identified no interval changes in medical condition and plan of care since the history and physical of record  

## 2014-09-23 ENCOUNTER — Encounter (HOSPITAL_COMMUNITY): Payer: Self-pay | Admitting: Ophthalmology

## 2014-11-30 ENCOUNTER — Other Ambulatory Visit: Payer: Self-pay | Admitting: Cardiology

## 2014-12-25 ENCOUNTER — Ambulatory Visit: Payer: 59 | Admitting: Cardiology

## 2014-12-30 ENCOUNTER — Other Ambulatory Visit (HOSPITAL_COMMUNITY): Payer: Self-pay | Admitting: Internal Medicine

## 2014-12-30 ENCOUNTER — Ambulatory Visit (HOSPITAL_COMMUNITY)
Admission: RE | Admit: 2014-12-30 | Discharge: 2014-12-30 | Disposition: A | Discharge status: Home / Self Care | Payer: 59 | Source: Ambulatory Visit | Attending: Internal Medicine | Admitting: Internal Medicine

## 2014-12-30 ENCOUNTER — Other Ambulatory Visit (HOSPITAL_COMMUNITY): Payer: Self-pay | Admitting: Orthopaedic Surgery

## 2014-12-30 DIAGNOSIS — R1011 Right upper quadrant pain: Secondary | ICD-10-CM | POA: Insufficient documentation

## 2014-12-30 DIAGNOSIS — R110 Nausea: Secondary | ICD-10-CM | POA: Insufficient documentation

## 2014-12-30 DIAGNOSIS — K760 Fatty (change of) liver, not elsewhere classified: Secondary | ICD-10-CM | POA: Insufficient documentation

## 2014-12-30 DIAGNOSIS — M25512 Pain in left shoulder: Secondary | ICD-10-CM

## 2014-12-31 ENCOUNTER — Other Ambulatory Visit (HOSPITAL_COMMUNITY): Payer: Self-pay | Admitting: Internal Medicine

## 2014-12-31 DIAGNOSIS — R1011 Right upper quadrant pain: Secondary | ICD-10-CM

## 2015-01-04 ENCOUNTER — Ambulatory Visit (HOSPITAL_COMMUNITY): Payer: 59

## 2015-01-05 ENCOUNTER — Encounter (HOSPITAL_COMMUNITY)
Admission: RE | Admit: 2015-01-05 | Discharge: 2015-01-05 | Disposition: A | Discharge status: Home / Self Care | Payer: 59 | Source: Ambulatory Visit | Attending: Internal Medicine | Admitting: Internal Medicine

## 2015-01-05 ENCOUNTER — Encounter (HOSPITAL_COMMUNITY): Payer: Self-pay

## 2015-01-05 DIAGNOSIS — R1011 Right upper quadrant pain: Secondary | ICD-10-CM | POA: Insufficient documentation

## 2015-01-05 MED ORDER — STERILE WATER FOR INJECTION IJ SOLN
INTRAMUSCULAR | Status: AC
  Administered 2015-01-05: 1.67 mL via INTRAVENOUS
  Filled 2015-01-05: qty 10

## 2015-01-05 MED ORDER — TECHNETIUM TC 99M MEBROFENIN IV KIT
5.0000 | PACK | Freq: Once | INTRAVENOUS | Status: AC | PRN
  Administered 2015-01-05: 5 via INTRAVENOUS

## 2015-01-05 MED ORDER — SODIUM CHLORIDE 0.9 % IJ SOLN
INTRAMUSCULAR | Status: AC
  Filled 2015-01-05: qty 12

## 2015-01-05 MED ORDER — SINCALIDE 5 MCG IJ SOLR
INTRAMUSCULAR | Status: AC
  Administered 2015-01-05: 1.67 ug via INTRAVENOUS
  Filled 2015-01-05: qty 5

## 2015-01-08 ENCOUNTER — Ambulatory Visit (HOSPITAL_COMMUNITY)
Admission: RE | Admit: 2015-01-08 | Discharge: 2015-01-08 | Disposition: A | Discharge status: Home / Self Care | Payer: 59 | Source: Ambulatory Visit | Attending: Orthopaedic Surgery | Admitting: Orthopaedic Surgery

## 2015-01-08 DIAGNOSIS — M25512 Pain in left shoulder: Secondary | ICD-10-CM | POA: Diagnosis present

## 2015-01-08 DIAGNOSIS — M75111 Incomplete rotator cuff tear or rupture of right shoulder, not specified as traumatic: Secondary | ICD-10-CM | POA: Insufficient documentation

## 2015-02-15 ENCOUNTER — Encounter: Payer: Self-pay | Admitting: Cardiology

## 2015-02-15 ENCOUNTER — Ambulatory Visit (INDEPENDENT_AMBULATORY_CARE_PROVIDER_SITE_OTHER): Payer: 59 | Admitting: Cardiology

## 2015-02-15 VITALS — BP 148/64 | HR 92 | Ht 64.0 in | Wt 184.0 lb

## 2015-02-15 DIAGNOSIS — E785 Hyperlipidemia, unspecified: Secondary | ICD-10-CM | POA: Diagnosis not present

## 2015-02-15 DIAGNOSIS — R002 Palpitations: Secondary | ICD-10-CM

## 2015-02-15 DIAGNOSIS — I2510 Atherosclerotic heart disease of native coronary artery without angina pectoris: Secondary | ICD-10-CM

## 2015-02-15 DIAGNOSIS — I10 Essential (primary) hypertension: Secondary | ICD-10-CM | POA: Diagnosis not present

## 2015-02-15 MED ORDER — LISINOPRIL-HYDROCHLOROTHIAZIDE 10-12.5 MG PO TABS: 1.0000 | ORAL_TABLET | Freq: Every day | ORAL | Status: DC

## 2015-02-15 MED ORDER — AMLODIPINE BESYLATE 10 MG PO TABS: 10.0000 mg | ORAL_TABLET | Freq: Every day | ORAL | Status: DC

## 2015-02-15 MED ORDER — ATORVASTATIN CALCIUM 80 MG PO TABS
80.0000 mg | ORAL_TABLET | Freq: Every day | ORAL | Status: DC
Start: 2015-02-15 — End: 2015-05-25

## 2015-02-15 NOTE — Progress Notes (Addendum)
Clinical Summary [name] is a 59 y.o.male seen today for follow up of the following medical problems.   1. CAD - multiple stents placed as described below - last cath 2011: LM patent, LAD 95% at apex, patent stent D1, LCX occluded in midsection at prior stent, RCA non dominant with patent prox stent. LVEF 55-60%. - CABG 05/2010 LIMA-LAD, SVG-Dx, SVG-ramus SVG-PL.  - 04/2010 echo LVEF 55-60%, grade II diastolic dysfunction.   - denies any chest pain since last visit. No SOB or DOE  2. Palpitations - 2 episodes over 2 month period, last episode 3 weeks ago - feeling heart racing, felt lightheaded. No SOB. Lasted 2 minutes - coffee x 2 cups, no tea, diet mtn dew occasionally, no energy drinks, no EtOH.   3. HTN - checks at home 1-2 times a week. Typicallty 120s/80s - compliant with meds  4. Hyperlipidemia  - followed by Dr Nevada Crane and endocrinoloigist, reports has been at goal.    Past Medical History  Diagnosis Date  . Arteriosclerotic cardiovascular disease (ASCVD) 2003    Non-Q MI with stent to RCA in 12/2001, normal EF, additional stents to mid CX and D1; cath in 02/2005-progressive or eye disease, 50-70% mid LAD, 90% distal LAD, 50% D1, 60-70% CX, small PDA with diffuse disease, patent RCA stent; repeat cath in 01/2008-no significant progression; non-Q MI in 02/2010 with 100% mid Cx at  prior stent, nondom. RCA with patent stent, 50% RI, nl EF, CABG-04/2010  . Diabetes mellitus     no insulin; patient recalls A1c of 6 in 2010  . Gastroesophageal reflux disease   . Hyperlipidemia     Predominantly elevated triglycerides  . Hypertension   . Anxiety   . Erectile dysfunction   . Chronic obstructive pulmonary disease   . Chronic kidney disease     Creatinine of 1.44 in 04/2009  . Tobacco abuse, in remission     35 pack years; discontinued 05/2010  . Abnormal LFTs   . Pneumonia 2006    2006  . Adenomatous colon polyp 2012  . Internal hemorrhoids   . Hepatic steatosis   .  Full dentures   . Wears glasses   . Allergy     to Red Meat  . Myocardial infarction 2002, 2011     Allergies  Allergen Reactions  . Ranexa [Ranolazine] Rash  . Other Hives    Red meat  . Wellbutrin [Bupropion Hcl] Rash     Current Outpatient Prescriptions  Medication Sig Dispense Refill  . amLODipine (NORVASC) 10 MG tablet Take 1 tablet (10 mg total) by mouth daily. 90 tablet 6  . aspirin EC 81 MG tablet Take 81 mg by mouth daily.    . diphenhydrAMINE (BENADRYL) 25 MG tablet Take 25 mg by mouth daily.    Marland Kitchen EPINEPHrine (EPI-PEN) 0.3 mg/0.3 mL SOAJ injection Inject 0.3 mg into the muscle as needed.    . hydrOXYzine (ATARAX/VISTARIL) 25 MG tablet Take 1 or 2 po Q 6hrs prn itching or rash (Patient taking differently: Take 25 mg by mouth every 6 (six) hours as needed for itching. Take 1 or 2 po Q 6hrs prn itching or rash) 60 tablet 0  . insulin glargine (LANTUS SOLOSTAR) 100 UNIT/ML injection Inject 50 Units into the skin daily.     Marland Kitchen lidocaine (XYLOCAINE JELLY) 2 % jelly Apply 1 application topically as needed. (Patient not taking: Reported on 09/11/2014) 30 mL 0  . lisinopril-hydrochlorothiazide (PRINZIDE,ZESTORETIC) 10-12.5 MG per tablet  Take 1 tablet by mouth daily.    . metFORMIN (GLUCOPHAGE) 1000 MG tablet Take 1,000 mg by mouth 2 (two) times daily with a meal.      . Multiple Vitamins-Minerals (MULTIVITAMIN WITH MINERALS) tablet Take 1 tablet by mouth daily.      . nitroGLYCERIN (NITROSTAT) 0.4 MG SL tablet Place 1 tablet (0.4 mg total) under the tongue every 5 (five) minutes as needed for chest pain. 25 tablet 3  . oxyCODONE-acetaminophen (PERCOCET) 10-325 MG per tablet Take 1 tablet by mouth every 4 (four) hours as needed for pain. (Patient not taking: Reported on 09/11/2014) 10 tablet 0  . polyethylene glycol powder (GLYCOLAX) powder Take 255 g (1 Container total) by mouth once. (Patient not taking: Reported on 09/11/2014) 255 g 0  . pravastatin (PRAVACHOL) 40 MG tablet TAKE 1  TABLET BY MOUTH ONCE DAILY 90 tablet 0  . Probiotic Product (PROBIOTIC DAILY PO) Take 1 tablet by mouth daily.    . ranitidine (ZANTAC) 150 MG tablet Take 150 mg by mouth daily.     No current facility-administered medications for this visit.     Past Surgical History  Procedure Laterality Date  . Appendectomy    . Colonoscopy w/ polypectomy  2012    Dr. Olevia Perches; 2 polyps removed one of which was precancerous  . Hemorrhoid surgery N/A 02/24/2014    Procedure: HEMORRHOIDECTOMY;  Surgeon: Joyice Faster. Cornett, MD;  Location: [facility];  Service: General;  Laterality: N/A;  . Coronary artery bypass graft  05/2010    4 vessels August 2011  . Cataract extraction w/phaco Left 09/21/2014    Procedure: CATARACT EXTRACTION PHACO AND INTRAOCULAR LENS PLACEMENT (IOC);  Surgeon: Tonny Jye Fariss, MD;  Location: [facility];  Service: Ophthalmology;  Laterality: Left;  CDE:9.77     Allergies  Allergen Reactions  . Ranexa [Ranolazine] Rash  . Other Hives    Red meat  . Wellbutrin [Bupropion Hcl] Rash      Family History  Problem Relation Age of Onset  . Cirrhosis Mother     + Sister  . Heart disease Father   . Pancreatic cancer Brother      Social History [name] reports that he has quit smoking. His smoking use included Cigarettes. He has a 15 pack-year smoking history. He has never used smokeless tobacco. [name] reports that he does not drink alcohol.   Review of Systems CONSTITUTIONAL: No weight loss, fever, chills, weakness or fatigue.  HEENT: Eyes: No visual loss, blurred vision, double vision or yellow sclerae.No hearing loss, sneezing, congestion, runny nose or sore throat.  SKIN: No rash or itching.  CARDIOVASCULAR: per HPI RESPIRATORY: No shortness of breath, cough or sputum.  GASTROINTESTINAL: No anorexia, nausea, vomiting or diarrhea. No abdominal pain or blood.  GENITOURINARY: No burning on urination, no polyuria NEUROLOGICAL: No headache, dizziness, syncope,  paralysis, ataxia, numbness or tingling in the extremities. No change in bowel or bladder control.  MUSCULOSKELETAL: No muscle, back pain, joint pain or stiffness.  LYMPHATICS: No enlarged nodes. No history of splenectomy.  PSYCHIATRIC: No history of depression or anxiety.  ENDOCRINOLOGIC: No reports of sweating, cold or heat intolerance. No polyuria or polydipsia.  Marland Kitchen   Physical Examination Filed Vitals:   02/15/15 1528  BP: 148/64  Pulse: 92   Filed Vitals:   02/15/15 1528  Height: 5\' 4"  (1.626 m)  Weight: 184 lb (83.462 kg)    Gen: resting comfortably, no acute distress HEENT: no scleral icterus, pupils equal  round and reactive, no palptable cervical adenopathy,  CV: RRR, no m/r/g, no JVD, no carotid bruits Resp: Clear to auscultation bilaterally GI: abdomen is soft, non-tender, non-distended, normal bowel sounds, no hepatosplenomegaly MSK: extremities are warm, no edema.  Skin: warm, no rash Neuro:  no focal deficits Psych: appropriate affect   Diagnostic Studies  2011 Cath Central aortic pressure 138/73 with a mean of 97. LV pressure 140/22 with an EDP of 24. There was no aortic stenosis.  Left main was normal. LAD was a large vessel coursed to the apex. It gave off a moderate-sized proximal diagonal. Throughout the LAD, there was mild diffuse disease with a focal 30% lesion in the midsection and a high-grade 95% stenosis at the apex. In the first diagonal, there was a previously placed stent, which was patent. There was minor luminal irregularities otherwise.  Left circumflex was a large dominant vessel, gave off a small-to- moderate ramus Giovanie Lefebre, a small OM Zalmen Wrightsman and then was totally occluded in the midsection within the previously placed stent. There was a 50% lesion in the mid AV groove circumflex right before the stent. There was also a 50% lesion in the upper portion of the ramus Angelica Wix.  Right coronary artery was nondominant vessel and had a high  anterior takeoff. There was a stent placed in the proximal portion, which was widely patent. There was an RV Jamani Eley, which was subtotally occluded. The vessel what appeared to be an atrial Tameyah Koch, which provided collaterals to the distal left circumflex system, primarily the left PDA. The distal right coronary once again was nondominant and small.  Left ventriculogram was done in the RAO position showed an EF of 55-60%. No regional wall motion abnormalities.  ASSESSMENT: 1. Three-vessel coronary artery disease as described above. 2. Reocclusion of the mid left circumflex drug-eluting stent with  right-to-left collaterals. 3. Normal left ventricular function.  PLAN: I have reviewed the catheterization films with Dr. Lia Foyer and Dr. Olevia Perches. Given that he has already two layers of drug-eluting stent, which he is re-occluded, likelihood that he would be able to keep this area open. After angioplasty, it would be quite low. We thus recommended titration of his medical therapy. He was previously on Viagra, but is no longer taking, so I will add Imdur 30 mg a day, may also be able to consider addition of Ranexa. Other possibilities would be possible single-vessel bypass surgery versus consideration of brachytherapy at [facility]. Dr. Lia Foyer will look into this. I will discuss with Dr. Lattie Haw.  04/2010 Cath Study Conclusions  - Left ventricle: The cavity size was normal. Wall thickness was  increased in a pattern of mild LVH. There was mild focal basal  hypertrophy of the septum. Systolic function was normal. The  estimated ejection fraction was in the range of 55% to 60%. There  appears to be mild mid posterior hypokinesis. Features are  consistent with a pseudonormal left ventricular filling pattern,  with concomitant abnormal relaxation and increased filling  pressure (grade 2 diastolic dysfunction). - Aortic valve: There was no stenosis. -  Mitral valve: Mild regurgitation. - Left atrium: The atrium was mildly dilated. - Right ventricle: The cavity size was normal. Systolic function was  normal. - Pulmonary arteries: No complete TR doppler jet was measured so  unable to estimate PA systolic pressure. - Inferior vena cava: The vessel was normal in size; the  respirophasic diameter changes were in the normal range (= 50%);  findings are consistent with normal central venous pressure.  Impressions:  - Normal LV size with mild LV hypertrophy. EF 55-60% with mild mid  posterior hypokinesis.Normal RV size and systolic function. Mild  mitral regurgitation. Transthoracic echocardiography. M-mode, complete 2D, spectral Doppler, and color Doppler.   02/03/14 Clinic EKG NSR, 1st degree AV block    Assessment and Plan   1. CAD - no recent symptoms, continue risk factor modification and secondary prevention  2. HTN - at goal, continue current meds  3. Palpitations - 2 isolated episodes. Will work to cut back caffeine, if symptoms reoccur consider monitor at that time.   4. Hyperlipidemia - change to high dose statin atorva 80mg  in the setting of known CAD       Arnoldo Lenis, M.D.

## 2015-02-15 NOTE — Patient Instructions (Signed)
Your physician wants you to follow-up in: 1 year with Dr Harl Bowie  May 2017 You will receive a reminder letter in the mail two months in advance. If you don't receive a letter, please call our office to schedule the follow-up appointment   STOP pravastatin   START Atorvastatin 80 mg daily at dinner   Thank you for choosing [facility] !

## 2015-05-24 ENCOUNTER — Telehealth: Payer: Self-pay | Admitting: Cardiology

## 2015-05-24 NOTE — Telephone Encounter (Signed)
Patient states that he is not tolerating new cholestrol med. Per Dr.Branch he can be switched back to previous one. / tg

## 2015-05-24 NOTE — Telephone Encounter (Signed)
PT states he was told he could go back on the previous med. Because he cannot tolerate the new one. Please advise

## 2015-05-24 NOTE — Telephone Encounter (Signed)
[name] and I had a very brief talk in the hallway regarding possible medication side effects, but no specific details. Please clarify exactly what symptoms he had when we changed his cholesterol medicine from pravastatin to atorvastatin and update me. Did he stop taking the atorvastatin on his own and if so did symptoms get better? We will either try lower dose atorva or prava depending on what symptoms he had  Zandra Abts MD

## 2015-05-25 MED ORDER — ATORVASTATIN CALCIUM 40 MG PO TABS: 40.0000 mg | ORAL_TABLET | Freq: Every day | ORAL | Status: DC

## 2015-05-25 NOTE — Telephone Encounter (Signed)
I would suggest trying atorvastatin at 40mg  daily if he is willing to see how he tolerates. Due to his heart history if he can tolerate atorvastatin it is better for him. If diarrhea continues then would change back to pravastatin 40mg  daily   Zandra Abts MD

## 2015-05-25 NOTE — Telephone Encounter (Signed)
Pt states you wanted him on atorvastatin vs pravastatin because it was a better medication. He has had diarrhea on atorvastatin

## 2015-05-25 NOTE — Telephone Encounter (Signed)
LM giving pt instructions from Dr Harl Bowie

## 2015-07-02 ENCOUNTER — Ambulatory Visit (INDEPENDENT_AMBULATORY_CARE_PROVIDER_SITE_OTHER): Payer: 59 | Admitting: Cardiology

## 2015-07-02 ENCOUNTER — Encounter: Payer: Self-pay | Admitting: Cardiology

## 2015-07-02 VITALS — BP 136/80 | HR 100 | Ht 65.0 in | Wt 180.0 lb

## 2015-07-02 DIAGNOSIS — R002 Palpitations: Secondary | ICD-10-CM | POA: Diagnosis not present

## 2015-07-02 MED ORDER — METOPROLOL TARTRATE 25 MG PO TABS: 25.0000 mg | ORAL_TABLET | Freq: Two times a day (BID) | ORAL | Status: DC | PRN

## 2015-07-02 NOTE — Progress Notes (Signed)
Patient ID: [name], male   DOB: [birth year], 59 y.o.   MRN: [id number]     Clinical Summary [name] is a 59 y.o.male seen today for follow up of the following medical problems. This is a focused visit on palpitations. For more detailed medical history refer to prior notes.   1. Palpitations - feeling of heart skipping started this AM. Felt sweaty and diaphrotic. No pain, no lightheadness or dizziness. Has had similar symptoms before but not as long in duration - denies any heavy coffee or EtOH use.       Past Medical History  Diagnosis Date  . Arteriosclerotic cardiovascular disease (ASCVD) 2003    Non-Q MI with stent to RCA in 12/2001, normal EF, additional stents to mid CX and D1; cath in 02/2005-progressive or eye disease, 50-70% mid LAD, 90% distal LAD, 50% D1, 60-70% CX, small PDA with diffuse disease, patent RCA stent; repeat cath in 01/2008-no significant progression; non-Q MI in 02/2010 with 100% mid Cx at  prior stent, nondom. RCA with patent stent, 50% RI, nl EF, CABG-04/2010  . Diabetes mellitus     no insulin; patient recalls A1c of 6 in 2010  . Gastroesophageal reflux disease   . Hyperlipidemia     Predominantly elevated triglycerides  . Hypertension   . Anxiety   . Erectile dysfunction   . Chronic obstructive pulmonary disease   . Chronic kidney disease     Creatinine of 1.44 in 04/2009  . Tobacco abuse, in remission     35 pack years; discontinued 05/2010  . Abnormal LFTs   . Pneumonia 2006    2006  . Adenomatous colon polyp 2012  . Internal hemorrhoids   . Hepatic steatosis   . Full dentures   . Wears glasses   . Allergy     to Red Meat  . Myocardial infarction 2002, 2011     Allergies  Allergen Reactions  . Ranexa [Ranolazine] Rash  . Other Hives    Red meat  . Wellbutrin [Bupropion Hcl] Rash     Current Outpatient Prescriptions  Medication Sig Dispense Refill  . amLODipine (NORVASC) 10 MG tablet Take 1 tablet (10 mg total) by mouth  daily. 90 tablet 4  . aspirin EC 81 MG tablet Take 81 mg by mouth daily.    Marland Kitchen atorvastatin (LIPITOR) 40 MG tablet Take 1 tablet (40 mg total) by mouth daily. 30 tablet 6  . diphenhydrAMINE (BENADRYL) 25 MG tablet Take 25 mg by mouth daily.    Marland Kitchen EPINEPHrine (EPI-PEN) 0.3 mg/0.3 mL SOAJ injection Inject 0.3 mg into the muscle as needed.    . hydrOXYzine (ATARAX/VISTARIL) 25 MG tablet Take 1 or 2 po Q 6hrs prn itching or rash (Patient taking differently: Take 25 mg by mouth every 6 (six) hours as needed for itching. Take 1 or 2 po Q 6hrs prn itching or rash) 60 tablet 0  . insulin glargine (LANTUS SOLOSTAR) 100 UNIT/ML injection Inject 50 Units into the skin daily.     Marland Kitchen lisinopril-hydrochlorothiazide (PRINZIDE,ZESTORETIC) 10-12.5 MG per tablet Take 1 tablet by mouth daily. 90 tablet 4  . metFORMIN (GLUCOPHAGE) 1000 MG tablet Take 1,000 mg by mouth 2 (two) times daily with a meal.      . Multiple Vitamins-Minerals (MULTIVITAMIN WITH MINERALS) tablet Take 1 tablet by mouth daily.      . polyethylene glycol powder (GLYCOLAX) powder Take 255 g (1 Container total) by mouth once. (Patient not taking: Reported on 09/11/2014) 255 g  0  . Probiotic Product (PROBIOTIC DAILY PO) Take 1 tablet by mouth daily.     No current facility-administered medications for this visit.     Past Surgical History  Procedure Laterality Date  . Appendectomy    . Colonoscopy w/ polypectomy  2012    Dr. Olevia Perches; 2 polyps removed one of which was precancerous  . Hemorrhoid surgery N/A 02/24/2014    Procedure: HEMORRHOIDECTOMY;  Surgeon: Joyice Faster. Cornett, MD;  Location: [facility];  Service: General;  Laterality: N/A;  . Coronary artery bypass graft  05/2010    4 vessels August 2011  . Cataract extraction w/phaco Left 09/21/2014    Procedure: CATARACT EXTRACTION PHACO AND INTRAOCULAR LENS PLACEMENT (IOC);  Surgeon: Tonny Branch, MD;  Location: [facility];  Service: Ophthalmology;  Laterality: Left;  CDE:9.77      Allergies  Allergen Reactions  . Ranexa [Ranolazine] Rash  . Other Hives    Red meat  . Wellbutrin [Bupropion Hcl] Rash      Family History  Problem Relation Age of Onset  . Cirrhosis Mother     + Sister  . Heart disease Father   . Pancreatic cancer Brother      Social History Mr. Ake reports that he quit smoking about 7 years ago. His smoking use included Cigarettes. He started smoking about 41 years ago. He has a 15 pack-year smoking history. He has never used smokeless tobacco. Mr. Hyndman reports that he does not drink alcohol.   Review of Systems CONSTITUTIONAL: No weight loss, fever, chills, weakness or fatigue.  HEENT: Eyes: No visual loss, blurred vision, double vision or yellow sclerae.No hearing loss, sneezing, congestion, runny nose or sore throat.  SKIN: No rash or itching.  CARDIOVASCULAR: per HPI RESPIRATORY: No shortness of breath, cough or sputum.  GASTROINTESTINAL: No anorexia, nausea, vomiting or diarrhea. No abdominal pain or blood.  GENITOURINARY: No burning on urination, no polyuria NEUROLOGICAL: No headache, dizziness, syncope, paralysis, ataxia, numbness or tingling in the extremities. No change in bowel or bladder control.  MUSCULOSKELETAL: No muscle, back pain, joint pain or stiffness.  LYMPHATICS: No enlarged nodes. No history of splenectomy.  PSYCHIATRIC: No history of depression or anxiety.  ENDOCRINOLOGIC: No reports of sweating, cold or heat intolerance. No polyuria or polydipsia.  Marland Kitchen   Physical Examination Filed Vitals:   07/02/15 1302  BP: 136/80  Pulse: 100   Filed Vitals:   07/02/15 1302  Height: 5\' 5"  (1.651 m)  Weight: 180 lb (81.647 kg)    Gen: resting comfortably, no acute distress HEENT: no scleral icterus, pupils equal round and reactive, no palptable cervical adenopathy,  CV: RRR, no m/r/g, no jvd Resp: Clear to auscultation bilaterally GI: abdomen is soft, non-tender, non-distended, normal bowel sounds, no  hepatosplenomegaly MSK: extremities are warm, no edema.  Skin: warm, no rash Neuro:  no focal deficits Psych: appropriate affect   Diagnostic Studies 2011 Cath Central aortic pressure 138/73 with a mean of 97. LV pressure 140/22 with an EDP of 24. There was no aortic stenosis.  Left main was normal. LAD was a large vessel coursed to the apex. It gave off a moderate-sized proximal diagonal. Throughout the LAD, there was mild diffuse disease with a focal 30% lesion in the midsection and a high-grade 95% stenosis at the apex. In the first diagonal, there was a previously placed stent, which was patent. There was minor luminal irregularities otherwise.  Left circumflex was a large dominant vessel, gave off a small-to- moderate  ramus branch, a small OM branch and then was totally occluded in the midsection within the previously placed stent. There was a 50% lesion in the mid AV groove circumflex right before the stent. There was also a 50% lesion in the upper portion of the ramus branch.  Right coronary artery was nondominant vessel and had a high anterior takeoff. There was a stent placed in the proximal portion, which was widely patent. There was an RV branch, which was subtotally occluded. The vessel what appeared to be an atrial branch, which provided collaterals to the distal left circumflex system, primarily the left PDA. The distal right coronary once again was nondominant and small.  Left ventriculogram was done in the RAO position showed an EF of 55-60%. No regional wall motion abnormalities.  ASSESSMENT: 1. Three-vessel coronary artery disease as described above. 2. Reocclusion of the mid left circumflex drug-eluting stent with  right-to-left collaterals. 3. Normal left ventricular function.  PLAN: I have reviewed the catheterization films with Dr. Lia Foyer and Dr. Olevia Perches. Given that he has already two layers of drug-eluting stent, which he is  re-occluded, likelihood that he would be able to keep this area open. After angioplasty, it would be quite low. We thus recommended titration of his medical therapy. He was previously on Viagra, but is no longer taking, so I will add Imdur 30 mg a day, may also be able to consider addition of Ranexa. Other possibilities would be possible single-vessel bypass surgery versus consideration of brachytherapy at [facility]. Dr. Lia Foyer will look into this. I will discuss with Dr. Lattie Haw.  04/2010 Cath Study Conclusions  - Left ventricle: The cavity size was normal. Wall thickness was  increased in a pattern of mild LVH. There was mild focal basal  hypertrophy of the septum. Systolic function was normal. The  estimated ejection fraction was in the range of 55% to 60%. There  appears to be mild mid posterior hypokinesis. Features are  consistent with a pseudonormal left ventricular filling pattern,  with concomitant abnormal relaxation and increased filling  pressure (grade 2 diastolic dysfunction). - Aortic valve: There was no stenosis. - Mitral valve: Mild regurgitation. - Left atrium: The atrium was mildly dilated. - Right ventricle: The cavity size was normal. Systolic function was  normal. - Pulmonary arteries: No complete TR doppler jet was measured so  unable to estimate PA systolic pressure. - Inferior vena cava: The vessel was normal in size; the  respirophasic diameter changes were in the normal range (= 50%);  findings are consistent with normal central venous pressure. Impressions:  - Normal LV size with mild LV hypertrophy. EF 55-60% with mild mid  posterior hypokinesis.Normal RV size and systolic function. Mild  mitral regurgitation. Transthoracic echocardiography. M-mode, complete 2D, spectral Doppler, and color Doppler.   02/03/14 Clinic EKG NSR, 1st degree AV block     Assessment and Plan   1. Palpitations - EKG in clinic shows  sinus tach at 100 - will check BMET, Mg, and TSH - obtain 7 day event monitor - will give metoprolol 25mg  bid prn palpitations only  F/u pending monitor results   Arnoldo Lenis, M.D.

## 2015-07-02 NOTE — Patient Instructions (Signed)
Your physician recommends that you schedule a follow-up appointment in: to be determined after testing   Take Lopressor 25 mg twice a day as needed for palpitations    Get lab work done   Your physician has recommended that you wear an event monitor for 7 days. Event monitors are medical devices that record the heart's electrical activity. Doctors most often [place] these monitors to diagnose arrhythmias. Arrhythmias are problems with the speed or rhythm of the heartbeat. The monitor is a small, portable device. You can wear one while you do your normal daily activities. This is usually used to diagnose what is causing palpitations/syncope (passing out).    Thank you for choosing [facility] !

## 2015-07-13 ENCOUNTER — Telehealth: Payer: Self-pay

## 2015-07-13 NOTE — Telephone Encounter (Signed)
Received fax notice that pt was cancelled because they could not reach him

## 2015-07-19 ENCOUNTER — Ambulatory Visit (INDEPENDENT_AMBULATORY_CARE_PROVIDER_SITE_OTHER): Payer: 59

## 2015-07-19 ENCOUNTER — Encounter: Payer: Self-pay | Admitting: Cardiology

## 2015-07-19 DIAGNOSIS — R002 Palpitations: Secondary | ICD-10-CM

## 2015-08-10 NOTE — Telephone Encounter (Signed)
08/10/15 Addendum: P did wear event monitor from 10/10 to 07/25/15

## 2015-10-28 MED FILL — TRESIBA FLEXTOUCH 200 UNITS: 200 | 30 days supply | Qty: 18 | Fill #1

## 2015-11-10 DIAGNOSIS — M25512 Pain in left shoulder: Secondary | ICD-10-CM | POA: Diagnosis not present

## 2015-11-10 DIAGNOSIS — M7552 Bursitis of left shoulder: Secondary | ICD-10-CM | POA: Diagnosis not present

## 2015-11-10 DIAGNOSIS — M75112 Incomplete rotator cuff tear or rupture of left shoulder, not specified as traumatic: Secondary | ICD-10-CM | POA: Diagnosis not present

## 2015-11-11 NOTE — H&P (Signed)
[name] Fears, MD   [name] Borg, PA-C [address] [place], [place]  [address]                             [phone]   ORTHOPAEDIC HISTORY & PHYSICAL  [name] MRN:  [id number] DOB/SEX:  [birth year]/male  CHIEF COMPLAINT:  Painful left shoulder  HISTORY: Mr [name] has been having trouble with shoulder pain dating back to February 2016.  There is no history of injury or trauma.  The pain is localized to the shoulder without referred pain distally or even proximally at the cervical spine.  He has had some trouble sleeping at night, which really prompted his office visit.  He is having difficulty lifting his arm over his head and having quite a bit of throbbing.     He thinks that about 10 years ago Dr. Amado [name] had treated the same shoulder for a similar problem with cortisone and it seemed to resolve the problem completely.  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  To some extent that has been compromised.    Films of his shoulder are obtained and he does have some AC joint arthritis, which does not appear to be symptomatic.  He had a normal space between the humeral head and the acromion and the head was centered in the glenoid. proceeded to try a corticosteroid injection for presumptive diagnosis of rotator cuff tendinitis. [place] of failure, MRI was ordered.  The MRI scan reveals a partial thickness articular surface tear of the supraspinatus tendon involving 50% thickness.  The infraspinatus tendon is intact.  The teres minor tendon is intact.  Mild insertional tendinosis of the subscapularis tendon.  There was no atrophy or fatty replacement nor abnormal signals within the muscles of the rotator cuff.  The biceps tendon had moderate tendinosis of the interarticular portion of the biceps tendon.  The [facility] joint revealed mild degenerative changes with a type IV acromion.  The  glenohumeral joint had a small joint effusion.  No chondral defect.  The labrum was grossly intact.  He had medullary hypointense mass in the proximal humeral diaphysis likely representing a benign calcified fibroosseous lesion or bone island.  There is no scratch there and no aggressive features.        PAST MEDICAL HISTORY: Patient Active Problem List   Diagnosis Date Noted  . Post-operative state 03/13/2014  . Hemorrhoids 01/19/2014  . Arteriosclerotic cardiovascular disease (ASCVD)   . Diabetes mellitus, type II ([place])   . Gastroesophageal reflux disease   . Hyperlipidemia   . Hypertension   . Chronic kidney disease   . Tobacco abuse, in remission   . ERECTILE DYSFUNCTION, NON-ORGANIC 10/06/2009   Past Medical History  Diagnosis Date  . Arteriosclerotic cardiovascular disease (ASCVD) 2003    Non-Q MI with stent to RCA in 12/2001, normal EF, additional stents to mid CX and D1; cath in 02/2005-progressive or eye disease, 50-70% mid LAD, 90% distal LAD, 50% D1, 60-70% CX, small PDA with diffuse disease, patent RCA stent; repeat cath in 01/2008-no significant progression; non-Q MI in 02/2010 with 100% mid Cx at  prior stent, nondom. RCA with patent stent, 50% RI, nl EF, CABG-04/2010  . Gastroesophageal reflux disease   . Hyperlipidemia     Predominantly elevated triglycerides  . Hypertension   . Anxiety   .  Erectile dysfunction   . Chronic obstructive pulmonary disease ([place])   . Chronic kidney disease     Creatinine of 1.44 in 04/2009  . Tobacco abuse, in remission     35 pack years; discontinued 05/2010  . Abnormal LFTs   . Pneumonia 2006    2006  . Adenomatous colon polyp 2012  . Internal hemorrhoids   . Hepatic steatosis   . Full dentures   . Wears glasses   . Allergy     to Red Meat  . Myocardial infarction ([place]) 2002, 2011  . Coronary artery disease     stent 2011  . Diabetes mellitus    Past Surgical History  Procedure Laterality Date  . Appendectomy    . Colonoscopy  w/ polypectomy  2012    Dr. Olevia Perches; 2 polyps removed one of which was precancerous  . Hemorrhoid surgery N/A 02/24/2014    Procedure: HEMORRHOIDECTOMY;  Surgeon: Joyice Faster. Cornett, MD;  Location: [facility];  Service: General;  Laterality: N/A;  . Coronary artery bypass graft  05/2010    4 vessels August 2011  . Cataract extraction w/phaco Left 09/21/2014    Procedure: CATARACT EXTRACTION PHACO AND INTRAOCULAR LENS PLACEMENT (IOC);  Surgeon: Tonny Branch, MD;  Location: [facility];  Service: Ophthalmology;  Laterality: Left;  CDE:9.77  . Eye surgery       MEDICATIONS:   No prescriptions prior to admission    ALLERGIES:   Allergies  Allergen Reactions  . Beef-Derived Products Hives    RED MEAT  . Other Hives    Red meat  . Ranexa [Ranolazine] Rash  . Wellbutrin [Bupropion Hcl] Rash    REVIEW OF SYSTEMS:  Cardiovascular: positive for hypertension and heart disease Endocrine: positive for diabetes   FAMILY HISTORY:   Family History  Problem Relation Age of Onset  . Cirrhosis Mother     + Sister  . Heart disease Father   . Pancreatic cancer Brother     SOCIAL HISTORY:   Social History  Substance Use Topics  . Smoking status: Current Every Day Smoker -- 0.50 packs/day for 30 years    Types: Cigarettes    Start date: 02/14/1974  . Smokeless tobacco: Never Used  . Alcohol Use: No      EXAMINATION: Vital signs in last 24 hours:    Head is normocephalic.   Eyes:  Pupils equal, round and reactive to light and accommodation.  Extraocular intact. ENT: Ears, nose, and throat were benign.   Neck: supple, no bruits were noted.   Chest: good expansion.   Lungs: essentially clear.   Cardiac: regular rhythm and rate, normal S1, S2.  No murmurs appreciated. Pulses :  2+ bilateral and symmetric in uppper extremities. Abdomen is scaphoid, soft, nontender, no masses palpable, normal bowel sounds present. CNS:  He is oriented x3 and cranial nerves II-XII grossly  intact. Breast, rectal, and genital exams: not performed and not indicated for an orthopedic evaluation. Musculoskeletal: I can get him to about 105 degrees or 110 degrees.  Passively I can get him to about 160 degrees of forward flexion.  He has a markedly positive empty can test at abduction and forward flexion.  He is weak against resistance in abduction.  Internal rotation is fine and with external rotation he is weak.       Imaging Review The MRI scan reveals a partial thickness articular surface tear of the supraspinatus tendon involving 50% thickness.  The infraspinatus tendon is intact.  The teres minor tendon is intact.  Mild insertional tendinosis of the subscapularis tendon.  There was no atrophy or fatty replacement nor abnormal signals within the muscles of the rotator cuff.  The biceps tendon had moderate tendinosis of the interarticular portion of the biceps tendon.  The [facility] joint revealed mild degenerative changes with a type IV acromion.  The glenohumeral joint had a small joint effusion.  No chondral defect.  The labrum was grossly intact.  He had medullary hypointense mass in the proximal humeral diaphysis likely representing a benign calcified fibroosseous lesion or bone island.  There is no scratch there and no aggressive features.    ASSESSMENT: left shoulder: 1.  Partial thickness articular surface tear of the supraspinatus tendon involving 50% of the thickness.  2.  Moderate tendinosis interarticular portion of the long head of the biceps tendon.  3.  Mild insertional tendinosis of the subscapularis tendon.   4.  AC arthritis.  5.  Type IV acromion.     Past Medical History  Diagnosis Date  . Arteriosclerotic cardiovascular disease (ASCVD) 2003    Non-Q MI with stent to RCA in 12/2001, normal EF, additional stents to mid CX and D1; cath in 02/2005-progressive or eye disease, 50-70% mid LAD, 90% distal LAD, 50% D1, 60-70% CX, small PDA with diffuse disease, patent RCA stent;  repeat cath in 01/2008-no significant progression; non-Q MI in 02/2010 with 100% mid Cx at  prior stent, nondom. RCA with patent stent, 50% RI, nl EF, CABG-04/2010  . Gastroesophageal reflux disease   . Hyperlipidemia     Predominantly elevated triglycerides  . Hypertension   . Anxiety   . Erectile dysfunction   . Chronic obstructive pulmonary disease ([place])   . Chronic kidney disease     Creatinine of 1.44 in 04/2009  . Tobacco abuse, in remission     35 pack years; discontinued 05/2010  . Abnormal LFTs   . Pneumonia 2006    2006  . Adenomatous colon polyp 2012  . Internal hemorrhoids   . Hepatic steatosis   . Full dentures   . Wears glasses   . Allergy     to Red Meat  . Myocardial infarction ([place]) 2002, 2011  . Coronary artery disease     stent 2011  . Diabetes mellitus     PLAN: Plan for left arthroscopic subacromial decompression and distal clavicle excision, possible biceps tenodesis and possible mini open rotator cuff repair.  The procedure,  risks, and benefits of surgery were presented and reviewed. The risks including but not limited to infection, blood clots, vascular and nerve injury, stiffness,  among others were discussed. The patient acknowledged the explanation, agreed to proceed.   Mike Craze [place], [place] [phone]  11/22/2015 12:36 PM

## 2015-11-15 NOTE — Pre-Procedure Instructions (Signed)
[name]  11/15/2015      [facility] - [facility], [place] [address] [place] [address] Phone: [phone] Fax: [phone]  [facility], [place] [address] [place] [address] Phone: [phone] Fax: [phone]  [place], [place] - [address] [place] [address] Phone: [phone] Fax: [phone]    Your procedure is scheduled on 11/23/2015   Tuesday   Report to [facility] Admitting at 5:30 A.M.   Call this number if you have problems the morning of surgery:  [phone]   Remember:  Do not eat food or drink liquids after midnight.   Take these medicines the morning of surgery with A SIP OF WATER ,amlodipine(Norvasc),,benadryl if needed  STOP all herbel meds, nsaids (aleve,naproxen,advil,ibuprofen) 5 days prior to surgery starting 11/18/15 including aspirin, multi vit, milk thistle  Do not take metformin am of surgery                                             How to Manage Your Diabetes Before Surgery   Why is it important to control my blood sugar before and after surgery?   Improving blood sugar levels before and after surgery helps healing and can limit problems.  A way of improving blood sugar control is eating a healthy diet by:  - Eating less sugar and carbohydrates  - Increasing activity/exercise  - Talk with your doctor about reaching your blood sugar goals  High blood sugars (greater than 180 mg/dL) can raise your risk of infections and slow down your recovery so you will need to focus on controlling your diabetes during the weeks before surgery.  Make sure that the doctor who takes care of your diabetes knows about your planned surgery including the date and location.  How do I manage my blood sugars before surgery?   Check your blood sugar at least 4 times a  day, 2 days before surgery to make sure that they are not too high or low.   Check your blood sugar the morning of your surgery when you wake up and every 2  hours until you get to the Short-Stay unit.  If your blood sugar is less than 70 mg/dL, you will need to treat for low blood sugar by:  Treat a low blood sugar (less than 70 mg/dL) with 1/2 cup of clear juice (cranberry or apple), 4 glucose tablets, OR glucose gel.  Recheck blood sugar in 15 minutes after treatment (to make sure it is greater than 70 mg/dL).  If blood sugar is not greater than 70 mg/dL on re-check, call [phone] for further instructions.   Report your blood sugar to the Short-Stay nurse when you get to Short-Stay.  References:  [facility], 2007 "How to Manage your Diabetes Before and After Surgery".  What do I do about my diabetes medications?   Do not take oral diabetes medicines (pills) the morning of surgery(metformin).     THE MORNING OF SURGERY, take 50 units of tresiba Insulin.(50%)  .    Do not wear jewelry  Do not wear lotions, powders, or perfumes.  You may not wear deodorant.   Do not shave 48 hours prior to  surgery.  Men may shave face and neck.   Do not bring valuables to the hospital.  [facility] is not responsible for any belongings or valuables.  Contacts, dentures or bridgework may not be worn into surgery.  Leave your suitcase in the car.  After surgery it may be brought to your room.  For patients admitted to the hospital, discharge time will be determined by your treatment team.  Patients discharged the day of surgery will not be allowed to drive home.    Special instructions:  Special Instructions: [place] - Preparing for Surgery  Before surgery, you can play an important role.  Because skin is not sterile, your skin needs to be as free of germs as possible.  You can reduce the number of germs on you skin by washing with CHG (chlorahexidine  gluconate) soap before surgery.  CHG is an antiseptic cleaner which kills germs and bonds with the skin to continue killing germs even after washing.  Please DO NOT use if you have an allergy to CHG or antibacterial soaps.  If your skin becomes reddened/irritated stop using the CHG and inform your nurse when you arrive at Short Stay.  Do not shave (including legs and underarms) for at least 48 hours prior to the first CHG shower.  You may shave your face.  Please follow these instructions carefully:   1.  Shower with CHG Soap the night before surgery and the morning of Surgery.  2.  If you choose to wash your hair, wash your hair first as usual with your normal shampoo.  3.  After you shampoo, rinse your hair and body thoroughly to remove the Shampoo.  4.  Use CHG as you would any other liquid soap.  You can apply chg directly  to the skin and wash gently with scrungie or a clean washcloth.  5.  Apply the CHG Soap to your body ONLY FROM THE NECK DOWN.  Do not use on open wounds or open sores.  Avoid contact with your eyes ears, mouth and genitals (private parts).  Wash genitals (private parts)       with your normal soap.  6.  Wash thoroughly, paying special attention to the area where your surgery will be performed.  7.  Thoroughly rinse your body with warm water from the neck down.  8.  DO NOT shower/wash with your normal soap after using and rinsing off the CHG Soap.  9.  Pat yourself dry with a clean towel.            10.  Wear clean pajamas.            11.  Place clean sheets on your bed the night of your first shower and do not sleep with pets.  Day of Surgery  Do not apply any lotions/deodorants the morning of surgery.  Please wear clean clothes to the hospital/surgery center. Please read over the following fact sheets that you were given. Pain Booklet, Coughing and Deep Breathing and Surgical Site Infection Prevention

## 2015-11-16 ENCOUNTER — Encounter (HOSPITAL_COMMUNITY): Payer: Self-pay

## 2015-11-16 ENCOUNTER — Encounter (HOSPITAL_COMMUNITY)
Admission: RE | Admit: 2015-11-16 | Discharge: 2015-11-16 | Disposition: A | Discharge status: Home / Self Care | Payer: 59 | Source: Ambulatory Visit | Attending: Orthopedic Surgery | Admitting: Orthopedic Surgery

## 2015-11-16 ENCOUNTER — Encounter (HOSPITAL_COMMUNITY)
Admission: RE | Admit: 2015-11-16 | Discharge: 2015-11-16 | Disposition: A | Discharge status: Home / Self Care | Payer: 59 | Source: Ambulatory Visit | Attending: Orthopaedic Surgery | Admitting: Orthopaedic Surgery

## 2015-11-16 DIAGNOSIS — K760 Fatty (change of) liver, not elsewhere classified: Secondary | ICD-10-CM | POA: Diagnosis not present

## 2015-11-16 DIAGNOSIS — I252 Old myocardial infarction: Secondary | ICD-10-CM | POA: Insufficient documentation

## 2015-11-16 DIAGNOSIS — N189 Chronic kidney disease, unspecified: Secondary | ICD-10-CM | POA: Diagnosis not present

## 2015-11-16 DIAGNOSIS — I2510 Atherosclerotic heart disease of native coronary artery without angina pectoris: Secondary | ICD-10-CM | POA: Insufficient documentation

## 2015-11-16 DIAGNOSIS — E1122 Type 2 diabetes mellitus with diabetic chronic kidney disease: Secondary | ICD-10-CM | POA: Diagnosis not present

## 2015-11-16 DIAGNOSIS — M19012 Primary osteoarthritis, left shoulder: Secondary | ICD-10-CM | POA: Insufficient documentation

## 2015-11-16 DIAGNOSIS — Z01818 Encounter for other preprocedural examination: Secondary | ICD-10-CM | POA: Insufficient documentation

## 2015-11-16 DIAGNOSIS — F17200 Nicotine dependence, unspecified, uncomplicated: Secondary | ICD-10-CM | POA: Diagnosis not present

## 2015-11-16 DIAGNOSIS — Z955 Presence of coronary angioplasty implant and graft: Secondary | ICD-10-CM | POA: Diagnosis not present

## 2015-11-16 DIAGNOSIS — M75102 Unspecified rotator cuff tear or rupture of left shoulder, not specified as traumatic: Secondary | ICD-10-CM | POA: Diagnosis not present

## 2015-11-16 DIAGNOSIS — I440 Atrioventricular block, first degree: Secondary | ICD-10-CM | POA: Insufficient documentation

## 2015-11-16 DIAGNOSIS — E785 Hyperlipidemia, unspecified: Secondary | ICD-10-CM | POA: Insufficient documentation

## 2015-11-16 DIAGNOSIS — Z01812 Encounter for preprocedural laboratory examination: Secondary | ICD-10-CM | POA: Diagnosis not present

## 2015-11-16 DIAGNOSIS — Z79899 Other long term (current) drug therapy: Secondary | ICD-10-CM | POA: Insufficient documentation

## 2015-11-16 DIAGNOSIS — M7542 Impingement syndrome of left shoulder: Secondary | ICD-10-CM | POA: Insufficient documentation

## 2015-11-16 DIAGNOSIS — Z7982 Long term (current) use of aspirin: Secondary | ICD-10-CM | POA: Diagnosis not present

## 2015-11-16 DIAGNOSIS — Z7984 Long term (current) use of oral hypoglycemic drugs: Secondary | ICD-10-CM | POA: Diagnosis not present

## 2015-11-16 DIAGNOSIS — K219 Gastro-esophageal reflux disease without esophagitis: Secondary | ICD-10-CM | POA: Insufficient documentation

## 2015-11-16 DIAGNOSIS — I129 Hypertensive chronic kidney disease with stage 1 through stage 4 chronic kidney disease, or unspecified chronic kidney disease: Secondary | ICD-10-CM | POA: Insufficient documentation

## 2015-11-16 DIAGNOSIS — J449 Chronic obstructive pulmonary disease, unspecified: Secondary | ICD-10-CM | POA: Insufficient documentation

## 2015-11-16 DIAGNOSIS — R918 Other nonspecific abnormal finding of lung field: Secondary | ICD-10-CM | POA: Diagnosis not present

## 2015-11-16 HISTORY — DX: Atherosclerotic heart disease of native coronary artery without angina pectoris: I25.10

## 2015-11-16 LAB — CBC WITH DIFFERENTIAL/PLATELET
Basophils Absolute: 0 10*3/uL (ref 0.0–0.1)
Basophils Relative: 0 %
EOS PCT: 5 %
Eosinophils Absolute: 0.6 10*3/uL (ref 0.0–0.7)
HEMATOCRIT: 40.9 % (ref 39.0–52.0)
HEMOGLOBIN: 13 g/dL (ref 13.0–17.0)
Lymphocytes Relative: 15 %
Lymphs Abs: 1.8 10*3/uL (ref 0.7–4.0)
MCH: 25.3 pg — AB (ref 26.0–34.0)
MCHC: 31.8 g/dL (ref 30.0–36.0)
MCV: 79.6 fL (ref 78.0–100.0)
MONOS PCT: 7 %
Monocytes Absolute: 0.8 10*3/uL (ref 0.1–1.0)
NEUTROS PCT: 73 %
Neutro Abs: 8.6 10*3/uL — ABNORMAL HIGH (ref 1.7–7.7)
Platelets: 250 10*3/uL (ref 150–400)
RBC: 5.14 MIL/uL (ref 4.22–5.81)
RDW: 15.6 % — ABNORMAL HIGH (ref 11.5–15.5)
WBC: 11.8 10*3/uL — ABNORMAL HIGH (ref 4.0–10.5)

## 2015-11-16 LAB — URINALYSIS, ROUTINE W REFLEX MICROSCOPIC
BILIRUBIN URINE: NEGATIVE
GLUCOSE, UA: NEGATIVE mg/dL
HGB URINE DIPSTICK: NEGATIVE
Ketones, ur: NEGATIVE mg/dL
Leukocytes, UA: NEGATIVE
Nitrite: NEGATIVE
Protein, ur: NEGATIVE mg/dL
SPECIFIC GRAVITY, URINE: 1.009 (ref 1.005–1.030)
pH: 6.5 (ref 5.0–8.0)

## 2015-11-16 LAB — COMPREHENSIVE METABOLIC PANEL
ALBUMIN: 3.7 g/dL (ref 3.5–5.0)
ALK PHOS: 247 U/L — AB (ref 38–126)
ALT: 31 U/L (ref 17–63)
ANION GAP: 13 (ref 5–15)
AST: 26 U/L (ref 15–41)
BILIRUBIN TOTAL: 0.7 mg/dL (ref 0.3–1.2)
BUN: 19 mg/dL (ref 6–20)
CALCIUM: 9.7 mg/dL (ref 8.9–10.3)
CO2: 24 mmol/L (ref 22–32)
CREATININE: 1.34 mg/dL — AB (ref 0.61–1.24)
Chloride: 102 mmol/L (ref 101–111)
GFR calc non Af Amer: 56 mL/min — ABNORMAL LOW (ref >60)
GLUCOSE: 124 mg/dL — AB (ref 65–99)
Potassium: 4.2 mmol/L (ref 3.5–5.1)
Sodium: 139 mmol/L (ref 135–145)
TOTAL PROTEIN: 7.1 g/dL (ref 6.5–8.1)

## 2015-11-16 LAB — PROTIME-INR
INR: 1.04 (ref 0.00–1.49)
Prothrombin Time: 13.8 seconds (ref 11.6–15.2)

## 2015-11-16 LAB — GLUCOSE, CAPILLARY: GLUCOSE-CAPILLARY: 140 mg/dL — AB (ref 65–99)

## 2015-11-16 LAB — APTT: aPTT: 32 seconds (ref 24–37)

## 2015-11-17 LAB — HEMOGLOBIN A1C
Hgb A1c MFr Bld: 6.5 % — ABNORMAL HIGH (ref 4.8–5.6)
MEAN PLASMA GLUCOSE: 140 mg/dL

## 2015-11-17 NOTE — Progress Notes (Signed)
Anesthesia Chart Review:  Pt is a 60 year old male scheduled for L shoulder arthroscopy with mini-open rotator cuff repair, distal clavicle resection and subacromial decompression on 11/23/2015 with Dr. Durward Fortes.   Cardiologist is Dr. Carlyle Dolly, last office visit 07/02/15.   PMH includes:  CAD (Non-Q MI with stents to RCA, to mid CX and D1  in 12/2001; cath in 02/2005-progressive disease; repeat cath in 01/2008-no significant progression; non-Q MI in 02/2010 with 100% mid Cx at prior stent, nondom, RCA with patent stent, 50% RI; CABG 04/2010: LIMA -LAD, SVG-Dx, SVG-ramus, SVG-PL ), HTN, DM, hyperlipidemia, COPD, CKD, hepatic steatosis, GERD. Current smoker. BMI 31. S/p cataract extraction 09/21/14. S/p hemorrhoidectomy 02/24/14.   Medications include: amlodipine, ASA, lipitor, lisinopril-hctz, metformin, zantac, tresiba. Pt is no longer taking metoprolol.   Preoperative labs reviewed.    Chest x-ray 11/16/15 reviewed. No acute abnormality.   EKG 11/16/15: Sinus rhythm with 1st degree A-V block  Cardiac event monitor 07/19/15: sinus rhythm. No significant arrhythmias. Reported sx correlate with isolated PVCs.   Echo 04/28/10:  - Left ventricle: The cavity size was normal. Wall thickness was increased in a pattern of mild LVH. There was mild focal basal hypertrophy of the septum. Systolic function was normal. The estimated ejection fraction was in the range of 55% to 60%. There appears to be mild mid posterior hypokinesis. Features are consistent with a pseudonormal left ventricular filling pattern, with concomitant abnormal relaxation and increased filling pressure (grade 2 diastolic dysfunction). - Aortic valve: There was no stenosis. - Mitral valve: Mild regurgitation. - Left atrium: The atrium was mildly dilated. - Right ventricle: The cavity size was normal. Systolic function was normal. - Pulmonary arteries: No complete TR doppler jet was measured so unable to estimate PA systolic  pressure. - Inferior vena cava: The vessel was normal in size; the respirophasic diameter changes were in the normal range (= 50%); findings are consistent with normal central venous pressure. - Impressions: Normal LV size with mild LV hypertrophy. EF 55-60% with mild mid posterior hypokinesis.Normal RV size and systolic function. Mild mitral regurgitation.  Pt with cardiology visit within 6 months. No CV sx reported at PAT.   If no changes, I anticipate pt can proceed with surgery as scheduled.   Willeen Cass, FNP-BC [facility] Short Stay Surgical Center/Anesthesiology Phone: [phone] 11/17/2015 3:03 PM

## 2015-11-22 ENCOUNTER — Encounter (HOSPITAL_BASED_OUTPATIENT_CLINIC_OR_DEPARTMENT_OTHER): Payer: Self-pay | Admitting: Anesthesiology

## 2015-11-23 ENCOUNTER — Encounter (HOSPITAL_COMMUNITY): Payer: Self-pay | Admitting: Surgery

## 2015-11-23 ENCOUNTER — Ambulatory Visit (HOSPITAL_COMMUNITY): Payer: 59 | Admitting: Anesthesiology

## 2015-11-23 ENCOUNTER — Ambulatory Visit (HOSPITAL_COMMUNITY): Payer: 59 | Admitting: Emergency Medicine

## 2015-11-23 ENCOUNTER — Ambulatory Visit (HOSPITAL_COMMUNITY)
Admission: RE | Admit: 2015-11-23 | Discharge: 2015-11-23 | Disposition: A | Discharge status: Home / Self Care | Payer: 59 | Source: Ambulatory Visit | Attending: Orthopaedic Surgery | Admitting: Orthopaedic Surgery

## 2015-11-23 ENCOUNTER — Encounter (HOSPITAL_COMMUNITY)
Admission: RE | Disposition: A | Discharge status: Home / Self Care | Payer: Self-pay | Source: Ambulatory Visit | Attending: Orthopaedic Surgery

## 2015-11-23 DIAGNOSIS — I129 Hypertensive chronic kidney disease with stage 1 through stage 4 chronic kidney disease, or unspecified chronic kidney disease: Secondary | ICD-10-CM | POA: Diagnosis not present

## 2015-11-23 DIAGNOSIS — I252 Old myocardial infarction: Secondary | ICD-10-CM | POA: Diagnosis not present

## 2015-11-23 DIAGNOSIS — I2510 Atherosclerotic heart disease of native coronary artery without angina pectoris: Secondary | ICD-10-CM | POA: Insufficient documentation

## 2015-11-23 DIAGNOSIS — M7542 Impingement syndrome of left shoulder: Secondary | ICD-10-CM | POA: Insufficient documentation

## 2015-11-23 DIAGNOSIS — N189 Chronic kidney disease, unspecified: Secondary | ICD-10-CM | POA: Diagnosis not present

## 2015-11-23 DIAGNOSIS — E1122 Type 2 diabetes mellitus with diabetic chronic kidney disease: Secondary | ICD-10-CM | POA: Insufficient documentation

## 2015-11-23 DIAGNOSIS — E785 Hyperlipidemia, unspecified: Secondary | ICD-10-CM | POA: Diagnosis not present

## 2015-11-23 DIAGNOSIS — F17210 Nicotine dependence, cigarettes, uncomplicated: Secondary | ICD-10-CM | POA: Insufficient documentation

## 2015-11-23 DIAGNOSIS — J449 Chronic obstructive pulmonary disease, unspecified: Secondary | ICD-10-CM | POA: Insufficient documentation

## 2015-11-23 DIAGNOSIS — M7522 Bicipital tendinitis, left shoulder: Secondary | ICD-10-CM | POA: Insufficient documentation

## 2015-11-23 DIAGNOSIS — M75112 Incomplete rotator cuff tear or rupture of left shoulder, not specified as traumatic: Secondary | ICD-10-CM | POA: Diagnosis present

## 2015-11-23 DIAGNOSIS — Z955 Presence of coronary angioplasty implant and graft: Secondary | ICD-10-CM | POA: Diagnosis not present

## 2015-11-23 DIAGNOSIS — S46112A Strain of muscle, fascia and tendon of long head of biceps, left arm, initial encounter: Secondary | ICD-10-CM | POA: Diagnosis not present

## 2015-11-23 DIAGNOSIS — M19012 Primary osteoarthritis, left shoulder: Secondary | ICD-10-CM | POA: Diagnosis not present

## 2015-11-23 DIAGNOSIS — M75102 Unspecified rotator cuff tear or rupture of left shoulder, not specified as traumatic: Secondary | ICD-10-CM | POA: Diagnosis not present

## 2015-11-23 DIAGNOSIS — G8918 Other acute postprocedural pain: Secondary | ICD-10-CM | POA: Diagnosis not present

## 2015-11-23 DIAGNOSIS — S46219A Strain of muscle, fascia and tendon of other parts of biceps, unspecified arm, initial encounter: Secondary | ICD-10-CM | POA: Diagnosis present

## 2015-11-23 HISTORY — PX: SHOULDER ARTHROSCOPY WITH OPEN ROTATOR CUFF REPAIR AND DISTAL CLAVICLE ACROMINECTOMY: SHX5683

## 2015-11-23 LAB — GLUCOSE, CAPILLARY
GLUCOSE-CAPILLARY: 83 mg/dL (ref 65–99)
GLUCOSE-CAPILLARY: 71 mg/dL (ref 65–99)

## 2015-11-23 SURGERY — SHOULDER ARTHROSCOPY WITH OPEN ROTATOR CUFF REPAIR AND DISTAL CLAVICLE ACROMINECTOMY
Anesthesia: General | Site: Shoulder | Laterality: Left

## 2015-11-23 MED ORDER — BUPIVACAINE-EPINEPHRINE (PF) 0.5% -1:200000 IJ SOLN
INTRAMUSCULAR | Status: DC | PRN
  Administered 2015-11-23: 25 mL via PERINEURAL

## 2015-11-23 MED ORDER — SODIUM CHLORIDE 0.9 % IR SOLN
Status: DC | PRN
  Administered 2015-11-23: 6000 mL

## 2015-11-23 MED ORDER — OXYCODONE-ACETAMINOPHEN 5-325 MG PO TABS
1.0000 | ORAL_TABLET | Freq: Once | ORAL | Status: AC
  Administered 2015-11-23: 2 via ORAL

## 2015-11-23 MED ORDER — PROMETHAZINE HCL 25 MG/ML IJ SOLN: 6.2500 mg | INTRAMUSCULAR | Status: DC | PRN

## 2015-11-23 MED ORDER — NEOSTIGMINE METHYLSULFATE 10 MG/10ML IV SOLN
INTRAVENOUS | Status: AC
  Filled 2015-11-23: qty 1

## 2015-11-23 MED ORDER — SODIUM CHLORIDE 0.9 % IV SOLN
INTRAVENOUS | Status: DC
Start: 2015-11-23 — End: 2015-11-25

## 2015-11-23 MED ORDER — LIDOCAINE HCL (CARDIAC) 20 MG/ML IV SOLN
INTRAVENOUS | Status: DC | PRN
  Administered 2015-11-23: 60 mg via INTRAVENOUS

## 2015-11-23 MED ORDER — LIDOCAINE HCL (CARDIAC) 20 MG/ML IV SOLN
INTRAVENOUS | Status: AC
  Filled 2015-11-23: qty 5

## 2015-11-23 MED ORDER — ARTIFICIAL TEARS OP OINT
TOPICAL_OINTMENT | OPHTHALMIC | Status: AC
  Filled 2015-11-23: qty 3.5

## 2015-11-23 MED ORDER — LACTATED RINGERS IV SOLN
INTRAVENOUS | Status: DC | PRN
  Administered 2015-11-23 (×2): via INTRAVENOUS

## 2015-11-23 MED ORDER — FENTANYL CITRATE (PF) 100 MCG/2ML IJ SOLN
INTRAMUSCULAR | Status: DC | PRN
  Administered 2015-11-23: 50 ug via INTRAVENOUS
  Administered 2015-11-23: 150 ug via INTRAVENOUS
  Administered 2015-11-23: 50 ug via INTRAVENOUS

## 2015-11-23 MED ORDER — OXYCODONE-ACETAMINOPHEN 5-325 MG PO TABS
ORAL_TABLET | ORAL | Status: AC
  Filled 2015-11-23: qty 2

## 2015-11-23 MED ORDER — SUCCINYLCHOLINE CHLORIDE 20 MG/ML IJ SOLN
INTRAMUSCULAR | Status: AC
  Filled 2015-11-23: qty 1

## 2015-11-23 MED ORDER — PROPOFOL 10 MG/ML IV BOLUS
INTRAVENOUS | Status: DC | PRN
  Administered 2015-11-23: 120 mg via INTRAVENOUS

## 2015-11-23 MED ORDER — ACETAMINOPHEN 10 MG/ML IV SOLN
1000.0000 mg | Freq: Four times a day (QID) | INTRAVENOUS | Status: AC
  Administered 2015-11-23: 1000 mg via INTRAVENOUS

## 2015-11-23 MED ORDER — EPHEDRINE SULFATE 50 MG/ML IJ SOLN
INTRAMUSCULAR | Status: AC
  Filled 2015-11-23: qty 1

## 2015-11-23 MED ORDER — OXYCODONE-ACETAMINOPHEN 5-325 MG PO TABS: 1.0000 | ORAL_TABLET | ORAL | Status: DC | PRN

## 2015-11-23 MED ORDER — ROCURONIUM BROMIDE 50 MG/5ML IV SOLN
INTRAVENOUS | Status: AC
  Filled 2015-11-23: qty 1

## 2015-11-23 MED ORDER — MIDAZOLAM HCL 5 MG/5ML IJ SOLN
INTRAMUSCULAR | Status: DC | PRN
  Administered 2015-11-23: 2 mg via INTRAVENOUS

## 2015-11-23 MED ORDER — FENTANYL CITRATE (PF) 250 MCG/5ML IJ SOLN
INTRAMUSCULAR | Status: AC
  Filled 2015-11-23: qty 5

## 2015-11-23 MED ORDER — PHENYLEPHRINE HCL 10 MG/ML IJ SOLN
INTRAMUSCULAR | Status: DC | PRN
  Administered 2015-11-23: 80 ug via INTRAVENOUS
  Administered 2015-11-23 (×2): 40 ug via INTRAVENOUS
  Administered 2015-11-23 (×2): 80 ug via INTRAVENOUS

## 2015-11-23 MED ORDER — GLYCOPYRROLATE 0.2 MG/ML IJ SOLN
INTRAMUSCULAR | Status: AC
  Filled 2015-11-23: qty 4

## 2015-11-23 MED ORDER — SUCCINYLCHOLINE CHLORIDE 20 MG/ML IJ SOLN
INTRAMUSCULAR | Status: DC | PRN
  Administered 2015-11-23: 100 mg via INTRAVENOUS

## 2015-11-23 MED ORDER — PROPOFOL 10 MG/ML IV BOLUS
INTRAVENOUS | Status: AC
  Filled 2015-11-23: qty 40

## 2015-11-23 MED ORDER — CHLORHEXIDINE GLUCONATE 4 % EX LIQD: 60.0000 mL | Freq: Once | CUTANEOUS | Status: DC

## 2015-11-23 MED ORDER — SODIUM CHLORIDE 0.9 % IV SOLN
10.0000 mg | INTRAVENOUS | Status: DC | PRN
  Administered 2015-11-23: 10 ug/min via INTRAVENOUS

## 2015-11-23 MED ORDER — ARTIFICIAL TEARS OP OINT
TOPICAL_OINTMENT | OPHTHALMIC | Status: DC | PRN
  Administered 2015-11-23: 1 via OPHTHALMIC

## 2015-11-23 MED ORDER — MIDAZOLAM HCL 2 MG/2ML IJ SOLN
INTRAMUSCULAR | Status: AC
  Filled 2015-11-23: qty 2

## 2015-11-23 MED ORDER — ACETAMINOPHEN 10 MG/ML IV SOLN
INTRAVENOUS | Status: AC
  Filled 2015-11-23: qty 100

## 2015-11-23 MED ORDER — HYDROMORPHONE HCL 1 MG/ML IJ SOLN: 0.2500 mg | INTRAMUSCULAR | Status: DC | PRN

## 2015-11-23 MED ORDER — ONDANSETRON HCL 4 MG/2ML IJ SOLN
INTRAMUSCULAR | Status: AC
  Filled 2015-11-23: qty 2

## 2015-11-23 MED ORDER — SODIUM CHLORIDE 0.9 % IJ SOLN
INTRAMUSCULAR | Status: AC
  Filled 2015-11-23: qty 10

## 2015-11-23 MED ORDER — CEFAZOLIN SODIUM-DEXTROSE 2-3 GM-% IV SOLR
2.0000 g | INTRAVENOUS | Status: AC
Start: 2015-11-23 — End: 2015-11-23
  Administered 2015-11-23: 2 g via INTRAVENOUS
  Filled 2015-11-23: qty 50

## 2015-11-23 MED ORDER — ONDANSETRON HCL 4 MG/2ML IJ SOLN
INTRAMUSCULAR | Status: DC | PRN
  Administered 2015-11-23: 4 mg via INTRAVENOUS

## 2015-11-23 MED ORDER — ROCURONIUM BROMIDE 100 MG/10ML IV SOLN
INTRAVENOUS | Status: DC | PRN
  Administered 2015-11-23: 30 mg via INTRAVENOUS
  Administered 2015-11-23: 20 mg via INTRAVENOUS

## 2015-11-23 SURGICAL SUPPLY — 56 items
ANCHOR SUT QUATTRO X2 5.5 (Anchor) ×2 IMPLANT
BENZOIN TINCTURE PRP APPL 2/3 (GAUZE/BANDAGES/DRESSINGS) ×2 IMPLANT
BLADE GREAT WHITE 4.2 (BLADE) IMPLANT
BLADE SURG 11 STRL SS (BLADE) ×2 IMPLANT
BNDG COHESIVE 3X5 TAN STRL LF (GAUZE/BANDAGES/DRESSINGS) ×2 IMPLANT
BUR OVAL 6.0 (BURR) ×2 IMPLANT
CANNULA ACUFLEX KIT 5X76 (CANNULA) ×2 IMPLANT
CLSR STERI-STRIP ANTIMIC 1/2X4 (GAUZE/BANDAGES/DRESSINGS) ×2 IMPLANT
COVER SURGICAL LIGHT HANDLE (MISCELLANEOUS) ×2 IMPLANT
DRAPE SHOULDER BEACH CHAIR (DRAPES) ×2 IMPLANT
DRSG PAD ABDOMINAL 8X10 ST (GAUZE/BANDAGES/DRESSINGS) ×2 IMPLANT
DURAPREP 26ML APPLICATOR (WOUND CARE) ×2 IMPLANT
ELECT NEEDLE TIP 2.8 STRL (NEEDLE) ×2 IMPLANT
ELECT REM PT RETURN 9FT ADLT (ELECTROSURGICAL) ×2
ELECTRODE REM PT RTRN 9FT ADLT (ELECTROSURGICAL) ×1 IMPLANT
GAUZE SPONGE 4X4 12PLY STRL (GAUZE/BANDAGES/DRESSINGS) ×2 IMPLANT
GLOVE BIOGEL PI IND STRL 8 (GLOVE) ×1 IMPLANT
GLOVE BIOGEL PI IND STRL 8.5 (GLOVE) ×1 IMPLANT
GLOVE BIOGEL PI INDICATOR 8 (GLOVE) ×1
GLOVE BIOGEL PI INDICATOR 8.5 (GLOVE) ×1
GLOVE ECLIPSE 8.0 STRL XLNG CF (GLOVE) ×2 IMPLANT
GLOVE SURG ORTHO 8.5 STRL (GLOVE) ×4 IMPLANT
GOWN STRL REUS W/ TWL LRG LVL3 (GOWN DISPOSABLE) ×2 IMPLANT
GOWN STRL REUS W/TWL 2XL LVL3 (GOWN DISPOSABLE) ×2 IMPLANT
GOWN STRL REUS W/TWL LRG LVL3 (GOWN DISPOSABLE) ×2
KIT ROOM TURNOVER OR (KITS) ×2 IMPLANT
MANIFOLD NEPTUNE II (INSTRUMENTS) ×2 IMPLANT
NEEDLE 22X1 1/2 (OR ONLY) (NEEDLE) IMPLANT
NEEDLE SPNL 18GX3.5 QUINCKE PK (NEEDLE) ×2 IMPLANT
NS IRRIG 1000ML POUR BTL (IV SOLUTION) ×2 IMPLANT
PACK SHOULDER (CUSTOM PROCEDURE TRAY) ×2 IMPLANT
PAD ARMBOARD 7.5X6 YLW CONV (MISCELLANEOUS) ×4 IMPLANT
RESTRAINT HEAD UNIVERSAL NS (MISCELLANEOUS) IMPLANT
SET ARTHROSCOPY TUBING (MISCELLANEOUS) ×1
SET ARTHROSCOPY TUBING LN (MISCELLANEOUS) ×1 IMPLANT
SLING ARM IMMOBILIZER LRG (SOFTGOODS) IMPLANT
SLING ARM IMMOBILIZER MED (SOFTGOODS) IMPLANT
SPONGE GAUZE 4X4 12PLY STER LF (GAUZE/BANDAGES/DRESSINGS) ×2 IMPLANT
STRIP CLOSURE SKIN 1/2X4 (GAUZE/BANDAGES/DRESSINGS) ×2 IMPLANT
SUT ETHIBOND 0 MO6 C/R (SUTURE) IMPLANT
SUT ETHIBOND 2 0 SH (SUTURE) IMPLANT
SUT ETHIBOND 2 OS 4 DA (SUTURE) ×2 IMPLANT
SUT ETHILON 4 0 PS 2 18 (SUTURE) IMPLANT
SUT MNCRL AB 3-0 PS2 18 (SUTURE) ×2 IMPLANT
SUT VIC AB 0 CT1 27 (SUTURE) ×1
SUT VIC AB 0 CT1 27XBRD ANBCTR (SUTURE) ×1 IMPLANT
SUT VIC AB 2-0 CT1 27 (SUTURE)
SUT VIC AB 2-0 CT1 TAPERPNT 27 (SUTURE) IMPLANT
SUT VICRYL 0 UR6 27IN ABS (SUTURE) ×2 IMPLANT
SYR CONTROL 10ML LL (SYRINGE) IMPLANT
TAPE CLOTH SURG 6X10 WHT LF (GAUZE/BANDAGES/DRESSINGS) ×2 IMPLANT
TOWEL OR 17X24 6PK STRL BLUE (TOWEL DISPOSABLE) ×2 IMPLANT
TOWEL OR 17X26 10 PK STRL BLUE (TOWEL DISPOSABLE) ×2 IMPLANT
TUBE CONNECTING 12X1/4 (SUCTIONS) ×4 IMPLANT
WAND HAND CNTRL MULTIVAC 90 (MISCELLANEOUS) ×2 IMPLANT
WATER STERILE IRR 1000ML POUR (IV SOLUTION) ×2 IMPLANT

## 2015-11-23 NOTE — Op Note (Signed)
PATIENT ID:      EASTYN EVANOFF  MRN:     [id number] DOB/AGE:    1956/08/15 / 60 y.o.       OPERATIVE REPORT    DATE OF PROCEDURE:  11/23/2015       PREOPERATIVE DIAGNOSIS:   LEFT SHOULDER IMPINGEMENT SYNDROME WITH  PARTIAL VERSUS FULL THICKNESS ROTATOR CUFF TEAR, BICEPS TENDONITIS, DJD A-C JOINT                                                       Estimated body mass index is 31.05 kg/(m^2) as calculated from the following:   Height as of 11/16/15: 5\' 4"  (1.626 m).   Weight as of this encounter: 82.101 kg (181 lb).     POSTOPERATIVE DIAGNOSIS:   LEFT SHOULDER IMPINGEMENT SYNDROME  WITH HIGH GRADE SUPRASPINATUS TENDON TEAR, TEAR BICEPS TENDON , DJD A-C JOINT                                                                    Estimated body mass index is 31.05 kg/(m^2) as calculated from the following:   Height as of 11/16/15: 5\' 4"  (1.626 m).   Weight as of this encounter: 82.101 kg (181 lb).     PROCEDURE:  Procedure(s):LEFT SHOULDER ARTHROSCOPY WITH SYNOVECTOMY G-H JOINT, RELEASE BICEPS TENDON, SAD, DCR, MINI OPEN BICEPS TENODESIS AND REPAIR SUPRASPINATUS TENDON TEAR     SURGEON:  Joni Fears, MD    ASSISTANT:   Biagio Borg, PA-C   (Present and scrubbed throughout the case, critical for assistance with exposure, retraction, instrumentation, and closure.)          ANESTHESIA: regional and general     DRAINS: none :      TOURNIQUET TIME: * No tourniquets in log *    COMPLICATIONS:  None   CONDITION:  stable  PROCEDURE IN DETAIL: Will, Akyra Bouchie W 11/23/2015, 9:22 AM

## 2015-11-23 NOTE — Anesthesia Procedure Notes (Addendum)
Anesthesia Regional Block:  Interscalene brachial plexus block  Pre-Anesthetic Checklist: ,, timeout performed, Correct Patient, Correct Site, Correct Laterality, Correct Procedure, Correct Position, site marked, Risks and benefits discussed,  Surgical consent,  Pre-op evaluation,  At surgeon's request and post-op pain management  Laterality: Upper and Left  Prep: chloraprep and alcohol swabs       Needles:  Injection technique: Single-shot  Needle Type: Stimulator Needle - 40     Needle Length: 4cm 4 cm Needle Gauge: 22 and 22 G  Needle insertion depth: 3 cm   Additional Needles:  Procedures: ultrasound guided (picture in chart) and nerve stimulator Interscalene brachial plexus block  Nerve Stimulator or Paresthesia:  Response: Twitch elicited, 0.5 mA, 0.3 ms,   Additional Responses:   Narrative:  Start time: 11/23/2015 7:10 AM End time: 11/23/2015 7:20 AM Injection made incrementally with aspirations every 5 mL.  Performed by: Personally  Anesthesiologist: Savahanna Almendariz  Additional Notes: Block assessed prior to start of surgery   Procedure Name: Intubation Date/Time: 11/23/2015 7:35 AM Performed by: Ebbie Latus E Pre-anesthesia Checklist: Patient identified, Emergency Drugs available, Suction available, Patient being monitored and Timeout performed Patient Re-evaluated:Patient Re-evaluated prior to inductionOxygen Delivery Method: Circle system utilized Preoxygenation: Pre-oxygenation with 100% oxygen Intubation Type: IV induction Ventilation: Mask ventilation without difficulty Laryngoscope Size: Mac and 3 Grade View: Grade I Tube type: Oral Tube size: 7.5 mm Number of attempts: 1 Airway Equipment and Method: Stylet Placement Confirmation: ETT inserted through vocal cords under direct vision,  positive ETCO2 and breath sounds checked- equal and bilateral Secured at: 22 cm Tube secured with: Tape Dental Injury: Teeth and Oropharynx as per pre-operative  assessment

## 2015-11-23 NOTE — Transfer of Care (Signed)
Immediate Anesthesia Transfer of Care Note  Patient: [name]  Procedure(s) Performed: Procedure(s) with comments: SHOULDER ARTHROSCOPY WITH MINI-OPEN ROTATOR CUFF REPAIR, DISTAL CLAVICLE RESECTION AND SUBACROMIAL DECOMPRESSION. (Left) - LEFT SHOULDER ARTHROSCOPIC SUBACROMIAL DECOMPRESSION, DISTAL CLAVICLE RESECTION, MINI-OPEN ROTATOR CUFF REPAIR.  Patient Location: PACU  Anesthesia Type:General  Level of Consciousness: awake, alert , oriented and sedated  Airway & Oxygen Therapy: Patient Spontanous Breathing and Patient connected to nasal cannula oxygen  Post-op Assessment: Report given to RN, Post -op Vital signs reviewed and stable and Patient moving all extremities  Post vital signs: Reviewed and stable  Last Vitals:  Filed Vitals:   11/23/15 0608 11/23/15 0951  BP: 161/69 142/72  Pulse: 97 75  Temp: 37.7 C 36.4 C  Resp: 20 18    Complications: No apparent anesthesia complications

## 2015-11-23 NOTE — Op Note (Signed)
NAMERAIDEN, HAYDU NO.:  [id number]  MEDICAL RECORD NO.:  [id number]  LOCATION:  [facility]                         FACILITY:  [facility]  PHYSICIAN:  Vonna Kotyk. Ashiyah Pavlak, M.D.DATE OF BIRTH:  05-09-1956  DATE OF PROCEDURE:  11/23/2015 DATE OF DISCHARGE:                              OPERATIVE REPORT   PREOPERATIVE DIAGNOSES:  Impingement syndrome, left shoulder with partial versus full-thickness rotator cuff tear, biceps tendinitis and degenerative joint disease of acromioclavicular joint.  POSTOPERATIVE DIAGNOSES:  Left shoulder impingement syndrome with high- grade partial supraspinatus tendon tear, tear of biceps tendon and degenerative joint disease of acromioclavicular joint.  PROCEDURES:  Diagnostic arthroscopy of left shoulder with synovectomy, glenohumeral joint.  Release of biceps tendon and arthroscopic subacromial decompression, arthroscopic distal clavicle resection, mini open biceps tenodesis and repair of supraspinatus tendon tear.  SURGEON:  Vonna Kotyk. Durward Fortes, M.D.  ASSISTANT:  Aaron Edelman D. Petrarca, P.A.-C.  ANESTHESIA:  General with supplemental interscalene nerve block.  COMPLICATIONS:  None.  HISTORY:  A 60 year old gentleman who has been followed for over a year for problems referable to his left shoulder.  He denies any history of injury or trauma, but has had considerable trouble with overhead activity and sleep.  I did perform a subacromial cortisone injection, I gave his considerable relief of pain only to have it recur.  He had an MRI scan in April of last year demonstrating a partial tear of the supraspinatus tendon approximately 50% as well as a biceps tendinitis. There was no evidence of chondral defects of the humeral head of the glenoid.  He had a type 4 acromion and no significant subacromial or subdeltoid bursal tissue.  He is now to have an arthroscopic evaluation for a SAD, DCR and then evaluate the rotator cuff and biceps  tendon.  DESCRIPTION OF PROCEDURE:  Mr. Byers was met with his wife in the holding area, identified the left shoulder as appropriate operative site and marked it accordingly.  Anesthesia performed an interscalene nerve block.  Mr. Fofana was then transported to room #7 and placed under general anesthesia without difficulty.  He was placed in the semi-sitting position with the shoulder frame.  Examination of the left shoulder demonstrated no evidence of adhesive capsulitis or instability.  The left shoulder was then prepped with DuraPrep in the base of the neck circumferentially below the elbow.  Sterile draping was performed.  A time-out was called.  A marking pen was used to outline the [facility] joint, the coracoid and the acromion.  At a point of fingerbreadth posterior and medial to the posterior angle acromion, a small stab wound was made.  The arthroscope easily placed into the shoulder joint.  Diagnostic arthroscopy revealed considerable tearing of the biceps tendon well over a 50% of its width. There was considerable fraying over an area of approximately 1 cm about a 1.5 cm distal to its attachment to the superior glenoid.  There were some areas of synovitis about the biceps tendon.  There was a high-grade partial tear of the supraspinatus and appeared to be well over 50%. There were some grade 1 and early grade 2 changes of chondromalacia of the humeral head and not the glenoid,  there were no loose bodies.  Second portal was established anteriorly with a Rib cannula, I released the biceps tendon with the ArthroCare wand from its superior attachment of the glenoid and then performed a synovectomy.  I debrided any frayed portions of the partial rotator cuff tear.  The arthroscope was then placed to the subacromial space posteriorly, the cannula and subacromial space anterior and one-third portal was established in the lateral subacromial space.  Arthroscopic  subacromial decompression was performed.  There was a moderate amount of inflammatory bursal tissue, which I resected with the ArthroCare wand. The patient has a grade 4 acromion per the MRI scan.  I thought there was considerable anterior and lateral overhang and curvature.  With a 6- mm bur, I performed a bony subacromial decompression, so that, I had nice flat resection.  There was also considerable degenerative change of the [facility] joint with inflammatory synovitis.  Distal clavicle resection was performed with the same 6-mm bur.  A mini open exploration of the supraspinatus and a biceps tenodesis was performed.  About an inch incision was made along the anterior aspect of the shoulder and in line with the biceps tendon.  Via sharp dissection, incision was carried down to the subcutaneous tissue.  A raphe in the deltoid fascia was identified and incised.  The subacromial space was entered.  Self-retaining retractor was inserted.  I was able to palpate the biceps groove, this was opened longitudinally with the Bovie.  The biceps tendon was retrieved and then tenodesed to the inferior aspect of its screw using a 5.5-mm Peek anchor with two attached #2 FiberWires.  I attached it under what I thought was normal muscle tension.  The extraneous portion of the biceps tendon was then moved.  I supplemented the tenodesis by suturing the remaining tip to the retinaculum and closed the retinaculum with running 0 Vicryl.  I then evaluated the rotator cuff tear.  There was an obvious area of the supraspinatus, there was some bursal surface tearing as well and because I thought it was well over 50%, I just did a small repair of the tendon at that point, I incised it transversely and then with a double- row suture, reattached the tendon.  With range of motion, there was no evidence of any further impingement.  The wound was irrigated with saline solution.  The deltoid fascia was closed with a running  0 Vicryl, subcu with 3-0 Monocryl, skin closure, Steri-Strips over benzoin.  PLAN:  The patient tolerated the procedure without complications.  We will plan to send him home on oxycodone for pain, a sling and follow up in the office next week.     Vonna Kotyk. Durward Fortes, M.D.     PWW/MEDQ  D:  11/23/2015  T:  11/23/2015  Job:  [id number]

## 2015-11-23 NOTE — Anesthesia Postprocedure Evaluation (Signed)
Anesthesia Post Note  Patient: TAMERON MITCHENER  Procedure(s) Performed: Procedure(s) (LRB): SHOULDER ARTHROSCOPY WITH MINI-OPEN ROTATOR CUFF REPAIR, DISTAL CLAVICLE RESECTION AND SUBACROMIAL DECOMPRESSION. (Left)  Patient location during evaluation: PACU Anesthesia Type: General and Regional Level of consciousness: awake and alert Pain management: pain level controlled Vital Signs Assessment: post-procedure vital signs reviewed and stable Respiratory status: spontaneous breathing, nonlabored ventilation, respiratory function stable and patient connected to nasal cannula oxygen Cardiovascular status: blood pressure returned to baseline and stable Postop Assessment: no signs of nausea or vomiting Anesthetic complications: no    Last Vitals:  Filed Vitals:   11/23/15 1100 11/23/15 1115  BP: 108/61 109/60  Pulse: 69 74  Temp:    Resp: 17 17    Last Pain:  Filed Vitals:   11/23/15 1127  PainSc: 5                  Chrissie Dacquisto,JAMES TERRILL

## 2015-11-23 NOTE — H&P (Signed)
  The recent History & Physical has been reviewed. I have personally examined the patient today. There is no interval change to the documented History & Physical. The patient would like to proceed with the procedure.  Joni Fears W 11/23/2015,  7:15 AM

## 2015-11-23 NOTE — Anesthesia Preprocedure Evaluation (Signed)
Anesthesia Evaluation  Patient identified by MRN, date of birth, ID band Patient awake    Reviewed: Allergy & Precautions  Airway Mallampati: II       Dental   Pulmonary COPD, Current Smoker,    breath sounds clear to auscultation       Cardiovascular hypertension, + CAD, + Past MI, + Cardiac Stents and + CABG   Rhythm:Regular Rate:Normal     Neuro/Psych negative neurological ROS     GI/Hepatic GERD  ,  Endo/Other  diabetes  Renal/GU      Musculoskeletal   Abdominal (+) + obese,   Peds  Hematology   Anesthesia Other Findings   Reproductive/Obstetrics                             Anesthesia Physical Anesthesia Plan  ASA: III  Anesthesia Plan: General   Post-op Pain Management: GA combined w/ Regional for post-op pain   Induction: Intravenous  Airway Management Planned: Oral ETT  Additional Equipment:   Intra-op Plan:   Post-operative Plan: Extubation in OR  Informed Consent:   Plan Discussed with:   Anesthesia Plan Comments:         Anesthesia Quick Evaluation

## 2015-11-24 ENCOUNTER — Encounter (HOSPITAL_COMMUNITY): Payer: Self-pay | Admitting: Orthopaedic Surgery

## 2015-12-07 MED FILL — ATORVASTATIN 80 MG TABLET: 80 | 90 days supply | Qty: 90 | Fill #1

## 2015-12-07 NOTE — Addendum Note (Signed)
Addendum  created 12/07/15 [id number] by Rica Koyanagi, MD   Modules edited: Anesthesia Blocks and Procedures, Clinical Notes   Clinical Notes:  File: [id number]

## 2015-12-09 ENCOUNTER — Ambulatory Visit (HOSPITAL_COMMUNITY): Payer: 59 | Attending: Orthopaedic Surgery | Admitting: Specialist

## 2015-12-09 ENCOUNTER — Encounter (HOSPITAL_COMMUNITY): Payer: Self-pay | Admitting: Specialist

## 2015-12-09 DIAGNOSIS — M25512 Pain in left shoulder: Secondary | ICD-10-CM | POA: Diagnosis not present

## 2015-12-09 DIAGNOSIS — M25522 Pain in left elbow: Secondary | ICD-10-CM | POA: Diagnosis not present

## 2015-12-09 DIAGNOSIS — R29898 Other symptoms and signs involving the musculoskeletal system: Secondary | ICD-10-CM | POA: Insufficient documentation

## 2015-12-09 DIAGNOSIS — Z9889 Other specified postprocedural states: Secondary | ICD-10-CM | POA: Insufficient documentation

## 2015-12-09 DIAGNOSIS — M25622 Stiffness of left elbow, not elsewhere classified: Secondary | ICD-10-CM | POA: Diagnosis not present

## 2015-12-09 DIAGNOSIS — M67922 Unspecified disorder of synovium and tendon, left upper arm: Secondary | ICD-10-CM | POA: Diagnosis not present

## 2015-12-09 DIAGNOSIS — M25612 Stiffness of left shoulder, not elsewhere classified: Secondary | ICD-10-CM | POA: Diagnosis not present

## 2015-12-09 NOTE — Therapy (Signed)
[place], [place], [address] Phone: [phone]   Fax:  [phone]  Occupational Therapy Evaluation  Patient Details  Name: GRAYER SZAFRAN MRN: [id number] Date of Birth: 02-Dec-1955 Referring Provider: Dr. Joni Fears  Encounter Date: 12/09/2015      OT End of Session - 12/09/15 1630    Visit Number 1   Number of Visits 24   Date for OT Re-Evaluation 02/07/16  mini reassess on 12/01/15 for MD visit   Authorization Type UMR   OT Start Time 1515   OT Stop Time 1600   OT Time Calculation (min) 45 min   Activity Tolerance Patient tolerated treatment well   Behavior During Therapy [facility] for tasks assessed/performed      Past Medical History  Diagnosis Date  . Arteriosclerotic cardiovascular disease (ASCVD) 2003    Non-Q MI with stent to RCA in 12/2001, normal EF, additional stents to mid CX and D1; cath in 02/2005-progressive or eye disease, 50-70% mid LAD, 90% distal LAD, 50% D1, 60-70% CX, small PDA with diffuse disease, patent RCA stent; repeat cath in 01/2008-no significant progression; non-Q MI in 02/2010 with 100% mid Cx at  prior stent, nondom. RCA with patent stent, 50% RI, nl EF, CABG-04/2010  . Gastroesophageal reflux disease   . Hyperlipidemia     Predominantly elevated triglycerides  . Hypertension   . Anxiety   . Erectile dysfunction   . Chronic obstructive pulmonary disease ([place])   . Chronic kidney disease     Creatinine of 1.44 in 04/2009  . Tobacco abuse, in remission     62 pack years; discontinued 05/2010  . Abnormal LFTs   . Pneumonia 2006    2006  . Adenomatous colon polyp 2012  . Internal hemorrhoids   . Hepatic steatosis   . Full dentures   . Wears glasses   . Allergy     to Red Meat  . Myocardial infarction ([place]) 2002, 2011  . Coronary artery disease     stent 2011  . Diabetes mellitus     Past Surgical History  Procedure Laterality Date  . Appendectomy    . Colonoscopy w/  polypectomy  2012    Dr. Olevia Perches; 2 polyps removed one of which was precancerous  . Hemorrhoid surgery N/A 02/24/2014    Procedure: HEMORRHOIDECTOMY;  Surgeon: Joyice Faster. Cornett, MD;  Location: [place];  Service: General;  Laterality: N/A;  . Coronary artery bypass graft  05/2010    4 vessels August 2011  . Cataract extraction w/phaco Left 09/21/2014    Procedure: CATARACT EXTRACTION PHACO AND INTRAOCULAR LENS PLACEMENT (IOC);  Surgeon: Tonny Branch, MD;  Location: [facility];  Service: Ophthalmology;  Laterality: Left;  CDE:9.77  . Eye surgery    . Shoulder arthroscopy with open rotator cuff repair and distal clavicle acrominectomy Left 11/23/2015    Procedure: SHOULDER ARTHROSCOPY WITH MINI-OPEN ROTATOR CUFF REPAIR, DISTAL CLAVICLE RESECTION AND SUBACROMIAL DECOMPRESSION.;  Surgeon: Garald Balding, MD;  Location: [place];  Service: Orthopedics;  Laterality: Left;  LEFT SHOULDER ARTHROSCOPIC SUBACROMIAL DECOMPRESSION, DISTAL CLAVICLE RESECTION, MINI-OPEN ROTATOR CUFF REPAIR.    There were no vitals filed for this visit.  Visit Diagnosis:  Shoulder pain, acute, left  Elbow pain, left  Shoulder joint stiffness, left  Joint stiffness of elbow, left  S/P rotator cuff repair  Biceps tendinopathy, left      Subjective Assessment - 12/09/15 1613    Subjective  S:  I have been having alot of pain in my shoulder for almost a year.  I had an MRI last year and just put off having the surgery until now.    Pertinent History Mr. Nidiffer began experiencing increased pain and decreased use of his left shoulder more than a year ago.  He  consulted with Dr. Durward Fortes and an MRI detected a partial rotator cuff tear.  He put off surgery for almost a year, and under went a repair of the left rotator cuff and left biceps tenodesis in February.  He has been referred to occupational therapy for evaluation and treatment following Dr Rudene Anda protocol.     Special Tests FOTO 34% independent    Patient Stated Goals I want to use my left arm just like I can my right arm.    Currently in Pain? Yes   Pain Score 4    Pain Location Shoulder   Pain Orientation Left   Pain Descriptors / Indicators Aching   Pain Type Acute pain   Pain Radiating Towards mid upper arm   Pain Onset 1 to 4 weeks ago   Pain Frequency Intermittent   Aggravating Factors  movement of his arm   Pain Relieving Factors rest, use of sling, ice, heat   Effect of Pain on Daily Activities unable to use left arm with daily activities            [facility] OT Assessment - 12/09/15 0001    Assessment   Diagnosis S/P Left Rotator Cuff Repair and Biceps Tenodesis   Referring Provider Dr. Joni Fears   Onset Date 11/23/15   Prior Therapy n/a   Precautions   Precautions Shoulder   Type of Shoulder Precautions sling at all times, P/ROM only to left shoulder and elbow for 4 weeks through 12/02/15   Restrictions   Weight Bearing Restrictions No   Balance Screen   Has the patient fallen in the past 6 months No   Has the patient had a decrease in activity level because of a fear of falling?  No   Is the patient reluctant to leave their home because of a fear of falling?  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  Normal job duties include heavy lifting, climbing, crawling, pushing, pulling etc.  Also use of computer, phone, etc   Leisure enjoys camping   ADL   ADL comments difficulty using his left arm with any activities above waist height.  Unable to tuck his shirt in, tie his shoes, unable to sleep comfortably at night, unable to perform job duties other than computer and phone use    Written Expression   Dominant Hand Right   Vision - History   Baseline Vision Wears glasses  all the time   Cognition   Overall Cognitive Status Within Functional Limits for tasks assessed   Observation/Other Assessments   Skin Integrity 4 scope incisions and 1 1" incision on anterior shoulder, moderate fascial restrictions in left shoulder region   Focus on Therapeutic Outcomes (FOTO)  34/100   Sensation   Light Touch Appears Intact   Coordination   Gross Motor Movements are Fluid and Coordinated Yes   Fine Motor Movements are Fluid and Coordinated Yes   AROM  Overall AROM Comments shoulder and elbow A/ROM not assessed due to protocol   AROM Assessment Site Shoulder;Elbow   Right/Left Shoulder Left   Right/Left Elbow Left   PROM   Overall PROM Comments P/ROM assessed in supine, External rotation and internal rotation with shoulder adductec   PROM Assessment Site Shoulder;Elbow   Right/Left Shoulder Left   Left Shoulder Flexion 80 Degrees   Left Shoulder ABduction 65 Degrees   Left Shoulder Internal Rotation 80 Degrees   Left Shoulder External Rotation 5 Degrees   Right/Left Elbow Left   Left Elbow Flexion 140   Left Elbow Extension 0   Strength   Overall Strength Comments strength not assessed due to recent surgery   Strength Assessment Site Shoulder;Elbow   Right/Left Shoulder Left   Right/Left Elbow Left                  OT Treatments/Exercises (OP) - 12/09/15 0001    Manual Therapy   Manual Therapy Myofascial release   Manual therapy comments manual therapy completed seperately from all other interventions this date   Myofascial Release myofascial release and manual stretching to left upper arm, biceps region, scapular region and shoulder region and associated areas to decrease pain and restrictions and improve pain free mobility in his left shoulder region.                OT Education - 12/09/15 1629    Education provided Yes   Education Details towel slides, pendulums, elbow P/ROM, forearm and wrist A/ROM   Person(s) Educated Patient    Methods Explanation;Demonstration;Handout   Comprehension Verbalized understanding;Returned demonstration          OT Short Term Goals - 12/09/15 1634    OT SHORT TERM GOAL #1   Title Patient will be educated on a HEP for left shoulder and elbow mobility needed to return to PLOF with daily activities.    Time 6   Period Weeks   Status New   OT SHORT TERM GOAL #2   Title Patient will improve P/ROM of left shoulder to [facility] in order to improve independence with tying his shoes.    Time 6   Period Weeks   Status New   OT SHORT TERM GOAL #3   Title Patietn will demonstrate 3+/5 strength in his left shoulder and elbow for improved ability to lift equipment at work.    Time 6   Period Weeks   Status New   OT SHORT TERM GOAL #4   Title Patient will decrease pain in his left shoulder to 3/10 or better when lying on his back at night.    Time 6   Period Weeks   Status New   OT SHORT TERM GOAL #5   Title Patient will demonstrate min-mod fascial restrictions in his left shoulder region for greater mobility needed to use left shoulder as an active assist with daily tasks.    Time 6   Period Weeks   Status New           OT Long Term Goals - 12/09/15 1637    OT LONG TERM GOAL #1   Title Patient will use his left arm 100% with all daily, work, and leisure activities at prior level of function.   Time 12   Period Weeks   Status New   OT LONG TERM GOAL #2   Title Patient will have WNL A/ROM in his left shoulder and elbow in order to be able to  tuck his shirt in and reach overhead to complete necessary work activities.    Time 12   Period Weeks   Status New   OT LONG TERM GOAL #3   Title Patient will have 5/5 strength in his left shoulder and elbow in order to pick up ladders an other equipment at work.    Time 12   Period Weeks   Status New   OT LONG TERM GOAL #4   Title Patient will have 1/10 pain or less in his left shoulder when lying on his back or reaching overhead.    Time  12   Period Weeks   Status New   OT LONG TERM GOAL #5   Title Patient will demonstrate trace fascial restrictions in his left shoulder and elbow region in order to have the necessary pain free range of motion needed for ADL completion.    Time 12   Period Weeks   Status New               Plan - 12/09/15 1631    Clinical Impression Statement A:  Patient is a 60 year old male s/p left rotator coff repair and biceps tenodesis due to partial tear of both tendons.  Patient had been experiencing pain and decreased functional use of his left arm for one year prior to surgery.  Surgery was performed on his nondominant shoulder, however he is unable to use his left arm functionally wtih daily BADLs, IADLS, work, and leisure activities.     Pt will benefit from skilled therapeutic intervention in order to improve on the following deficits (Retired) Decreased strength;Decreased skin integrity;Decreased range of motion;Increased fascial restricitons;Increased muscle spasms;Pain   Rehab Potential Good   OT Frequency 2x / week   OT Duration 12 weeks   OT Treatment/Interventions Self-care/ADL training;Cryotherapy;Electrical Stimulation;Moist Heat;Ultrasound;Iontophoresis;Neuromuscular education;Therapeutic exercise;Manual Therapy;Scar mobilization;Passive range of motion;Therapeutic activities;Therapeutic exercises;Patient/family education   Plan P: Skilled OT intervention to decrease pain and restrictions and improve pain free mobility and strength in his left shoulder and elbow needed to return to his prior level of function with his B/IADLs, work, and leisure activities.  Next session:  P/ROM of shoulder and elbow through 12/30/15, review HEP, review treatment plan, A/ROM elevation, extension, row, ball stretches.    Consulted and Agree with Plan of Care Patient        Problem List Patient Active Problem List   Diagnosis Date Noted  . Partial tear of left rotator cuff 11/23/2015  . Biceps  tendon tear 11/23/2015  . Impingement syndrome of left shoulder 11/23/2015  . Post-operative state 03/13/2014  . Hemorrhoids 01/19/2014  . Arteriosclerotic cardiovascular disease (ASCVD)   . Diabetes mellitus, type II ([place])   . Gastroesophageal reflux disease   . Hyperlipidemia   . Hypertension   . Chronic kidney disease   . Tobacco abuse, in remission   . ERECTILE DYSFUNCTION, NON-ORGANIC 10/06/2009    Vangie Bicker, OTR/L [phone]  12/09/2015, 4:46 PM  [facility] [address] [place], [place], [address] Phone: [phone]   Fax:  [phone]  Name: DARLIN GEER MRN: [id number] Date of Birth: 05/03/1956

## 2015-12-09 NOTE — Patient Instructions (Signed)
SHOULDER: Flexion On Table   Place hands on table, elbows straight.Stretch arms across table, leaning forward as able. Hold __15_ seconds. _15__ reps per set, __3_ sets per day, __7_ days per week  Abduction (Passive)   With arm out to side, resting on table, palm down, ,stretch arm across the table, leaning toward the table as able. Hold ___15_ seconds. Repeat __15__ times. Do __3__ sessions per day.  Copyright  VHI. All rights reserved.     Internal Rotation (Assistive)   Seated with elbow bent at right angle and held against side, slide arm on table surface in an inward arc. Repeat __15__ times. Do __3__ sessions per day. Activity: Use this motion to brush crumbs off the table.  Copyright  VHI. All rights reserved.         COMPLETE PENDULUM EXERCISES FOR 30 SECONDS TO A MINUTE EACH, 3-5 TIMES PER DAY. ROM: Pendulum (Side-to-Side)   Lean forward and let arm hang down, swing side to side using body momentum.  http://orth.exer.us/792   Copyright  VHI. All rights reserved.  Pendulum Forward/Back   Bend forward 90 at waist, using table for support. Rock body forward and back to swing arm. Repeat ____ times. Do ____ sessions per day.  Copyright  VHI. All rights reserved.  Pendulum Circular   Bend forward 90 at waist, leaning on table for support. Rock body in a circular pattern to move arm clockwise ____ times then counterclockwise ____ times. Do ____ sessions per day.  Copyright  VHI. All rights reserved.  AROM: Wrist Extension   With right palm down, bend wrist up. Repeat 10____ times per set. Do ____ sets per session. Do __3__ sessions per day.  Copyright  VHI. All rights reserved.   AROM: Wrist Flexion   With left palm up, bend wrist up. Repeat ___10_ times per set. Do ____ sets per session. Do __3__ sessions per day.  Copyright  VHI. All rights reserved.   AROM: Forearm Pronation / Supination   With left arm in handshake position,  slowly rotate palm down until stretch is felt. Relax. Then rotate palm up until stretch is felt. Repeat __10__ times per set. Do ____ sets per session. Do __3__ sessions per day.  Copyright  VHI. All rights reserved.   AFlexion (Passive)   Use other hand to bend elbow, with thumb toward same shoulder. Do NOT force this motion. Hold __5__ seconds. Repeat ___15_ times. Do _3___ sessions per day.  Copyright  VHI. All rights reserved.    Copyright  VHI. All rights reserved.

## 2015-12-10 ENCOUNTER — Encounter (HOSPITAL_COMMUNITY): Payer: 59 | Admitting: Occupational Therapy

## 2015-12-14 ENCOUNTER — Ambulatory Visit (HOSPITAL_COMMUNITY): Payer: 59

## 2015-12-14 ENCOUNTER — Encounter (HOSPITAL_COMMUNITY): Payer: Self-pay

## 2015-12-14 DIAGNOSIS — M25522 Pain in left elbow: Secondary | ICD-10-CM | POA: Diagnosis not present

## 2015-12-14 DIAGNOSIS — R29898 Other symptoms and signs involving the musculoskeletal system: Secondary | ICD-10-CM

## 2015-12-14 DIAGNOSIS — M25512 Pain in left shoulder: Secondary | ICD-10-CM | POA: Diagnosis not present

## 2015-12-14 DIAGNOSIS — M25612 Stiffness of left shoulder, not elsewhere classified: Secondary | ICD-10-CM

## 2015-12-14 DIAGNOSIS — M67922 Unspecified disorder of synovium and tendon, left upper arm: Secondary | ICD-10-CM | POA: Diagnosis not present

## 2015-12-14 DIAGNOSIS — Z9889 Other specified postprocedural states: Secondary | ICD-10-CM | POA: Diagnosis not present

## 2015-12-14 DIAGNOSIS — M25622 Stiffness of left elbow, not elsewhere classified: Secondary | ICD-10-CM | POA: Diagnosis not present

## 2015-12-14 NOTE — Therapy (Signed)
[place], [place], [address] Phone: [phone]   Fax:  [phone]  Occupational Therapy Treatment  Patient Details  Name: [name] MRN: [id number] Date of Birth: May 04, 1956 Referring Provider: Dr. Joni Fears  Encounter Date: 12/14/2015      OT End of Session - 12/14/15 1628    Visit Number 2   Number of Visits 24   Date for OT Re-Evaluation 02/07/16  mini reassess on 12/01/15 for MD visit   Authorization Type UMR   OT Start Time 1350   OT Stop Time 1430   OT Time Calculation (min) 40 min   Activity Tolerance Patient tolerated treatment well   Behavior During Therapy [facility] for tasks assessed/performed      Past Medical History  Diagnosis Date  . Arteriosclerotic cardiovascular disease (ASCVD) 2003    Non-Q MI with stent to RCA in 12/2001, normal EF, additional stents to mid CX and D1; cath in 02/2005-progressive or eye disease, 50-70% mid LAD, 90% distal LAD, 50% D1, 60-70% CX, small PDA with diffuse disease, patent RCA stent; repeat cath in 01/2008-no significant progression; non-Q MI in 02/2010 with 100% mid Cx at  prior stent, nondom. RCA with patent stent, 50% RI, nl EF, CABG-04/2010  . Gastroesophageal reflux disease   . Hyperlipidemia     Predominantly elevated triglycerides  . Hypertension   . Anxiety   . Erectile dysfunction   . Chronic obstructive pulmonary disease ([place])   . Chronic kidney disease     Creatinine of 1.44 in 04/2009  . Tobacco abuse, in remission     35 pack years; discontinued 05/2010  . Abnormal LFTs   . Pneumonia 2006    2006  . Adenomatous colon polyp 2012  . Internal hemorrhoids   . Hepatic steatosis   . Full dentures   . Wears glasses   . Allergy     to Red Meat  . Myocardial infarction ([place]) 2002, 2011  . Coronary artery disease     stent 2011  . Diabetes mellitus     Past Surgical History  Procedure Laterality Date  . Appendectomy    . Colonoscopy w/  polypectomy  2012    Dr. Olevia Perches; 2 polyps removed one of which was precancerous  . Hemorrhoid surgery N/A 02/24/2014    Procedure: HEMORRHOIDECTOMY;  Surgeon: Joyice Faster. Cornett, MD;  Location: [place];  Service: General;  Laterality: N/A;  . Coronary artery bypass graft  05/2010    4 vessels August 2011  . Cataract extraction w/phaco Left 09/21/2014    Procedure: CATARACT EXTRACTION PHACO AND INTRAOCULAR LENS PLACEMENT (IOC);  Surgeon: Tonny Branch, MD;  Location: [facility];  Service: Ophthalmology;  Laterality: Left;  CDE:9.77  . Eye surgery    . Shoulder arthroscopy with open rotator cuff repair and distal clavicle acrominectomy Left 11/23/2015    Procedure: SHOULDER ARTHROSCOPY WITH MINI-OPEN ROTATOR CUFF REPAIR, DISTAL CLAVICLE RESECTION AND SUBACROMIAL DECOMPRESSION.;  Surgeon: Garald Balding, MD;  Location: [place];  Service: Orthopedics;  Laterality: Left;  LEFT SHOULDER ARTHROSCOPIC SUBACROMIAL DECOMPRESSION, DISTAL CLAVICLE RESECTION, MINI-OPEN ROTATOR CUFF REPAIR.    There were no vitals filed for this visit.  Visit Diagnosis:  Shoulder pain, acute, left  Shoulder joint stiffness, left  Shoulder weakness      Subjective Assessment - 12/14/15 1413    Subjective  S: I only have pain when I'm moving it.   Currently in Pain? Yes   Pain  Score 5    Pain Location Shoulder   Pain Orientation Left   Pain Descriptors / Indicators Aching   Pain Type Acute pain            [facility] OT Assessment - 12/14/15 1414    Assessment   Diagnosis S/P Left Rotator Cuff Repair and Biceps Tenodesis   Precautions   Precautions Shoulder   Type of Shoulder Precautions sling at all times, P/ROM only to left shoulder and elbow for 4 weeks through 12/02/15                  OT Treatments/Exercises (OP) - 12/14/15 1415    Exercises   Exercises Shoulder   Shoulder Exercises: Supine   Protraction PROM;10 reps   Horizontal ABduction PROM;10 reps   External Rotation PROM;10  reps   Internal Rotation PROM;10 reps   Flexion PROM;10 reps   ABduction PROM;10 reps   Shoulder Exercises: Therapy Ball   Flexion --  12X   ABduction --  12X   Shoulder Exercises: Isometric Strengthening   Flexion Supine;3X3"   Extension Supine;3X3"   External Rotation Supine;3X3"   Internal Rotation Supine;3X3"   ABduction Supine;3X3"   ADduction Supine;3X3"   Manual Therapy   Manual Therapy Myofascial release   Manual therapy comments manual therapy completed seperately from all other interventions this date   Myofascial Release myofascial release and manual stretching to left upper arm, biceps region, scapular region and shoulder region and associated areas to decrease pain and restrictions and improve pain free mobility in his left shoulder region.                 OT Education - 12/14/15 1412    Education provided Yes   Education Details Patient was given print out of OT evaluation and plan of care and goals were reviewed.    Methods Explanation;Handout   Comprehension Verbalized understanding          OT Short Term Goals - 12/14/15 1632    OT SHORT TERM GOAL #1   Title Patient will be educated on a HEP for left shoulder and elbow mobility needed to return to PLOF with daily activities.    Time 6   Period Weeks   Status On-going   OT SHORT TERM GOAL #2   Title Patient will improve P/ROM of left shoulder to [facility] in order to improve independence with tying his shoes.    Time 6   Period Weeks   Status On-going   OT SHORT TERM GOAL #3   Title Patietn will demonstrate 3+/5 strength in his left shoulder and elbow for improved ability to lift equipment at work.    Time 6   Period Weeks   Status On-going   OT SHORT TERM GOAL #4   Title Patient will decrease pain in his left shoulder to 3/10 or better when lying on his back at night.    Time 6   Period Weeks   Status On-going   OT SHORT TERM GOAL #5   Title Patient will demonstrate min-mod fascial  restrictions in his left shoulder region for greater mobility needed to use left shoulder as an active assist with daily tasks.    Time 6   Period Weeks   Status On-going           OT Long Term Goals - 12/14/15 1632    OT LONG TERM GOAL #1   Title Patient will use his left arm 100% with all  daily, work, and leisure activities at prior level of function.   Time 12   Period Weeks   Status On-going   OT LONG TERM GOAL #2   Title Patient will have WNL A/ROM in his left shoulder and elbow in order to be able to tuck his shirt in and reach overhead to complete necessary work activities.    Time 12   Period Weeks   Status On-going   OT LONG TERM GOAL #3   Title Patient will have 5/5 strength in his left shoulder and elbow in order to pick up ladders an other equipment at work.    Time 12   Period Weeks   Status On-going   OT LONG TERM GOAL #4   Title Patient will have 1/10 pain or less in his left shoulder when lying on his back or reaching overhead.    Time 12   Period Weeks   Status On-going   OT LONG TERM GOAL #5   Title Patient will demonstrate trace fascial restrictions in his left shoulder and elbow region in order to have the necessary pain free range of motion needed for ADL completion.    Time 12   Period Weeks   Status On-going               Plan - 12/14/15 1630    Clinical Impression Statement A: Initiated myofascial release, manual stretching, isometrics, and therapy ball exercises. VC for form and technique as needed.    Plan P: Continue with P/ROM. Increase isometrics hold as able.         Problem List Patient Active Problem List   Diagnosis Date Noted  . Partial tear of left rotator cuff 11/23/2015  . Biceps tendon tear 11/23/2015  . Impingement syndrome of left shoulder 11/23/2015  . Post-operative state 03/13/2014  . Hemorrhoids 01/19/2014  . Arteriosclerotic cardiovascular disease (ASCVD)   . Diabetes mellitus, type II ([place])   .  Gastroesophageal reflux disease   . Hyperlipidemia   . Hypertension   . Chronic kidney disease   . Tobacco abuse, in remission   . ERECTILE DYSFUNCTION, NON-ORGANIC 10/06/2009    Ailene Ravel, OTR/L,CBIS  [phone]  12/14/2015, 4:55 PM  [facility] [address] [place], [place], [address] Phone: [phone]   Fax:  [phone]  Name: KION ARPINO MRN: [id number] Date of Birth: [birth year]

## 2015-12-16 ENCOUNTER — Ambulatory Visit (HOSPITAL_COMMUNITY): Payer: 59 | Admitting: Specialist

## 2015-12-16 DIAGNOSIS — Z9889 Other specified postprocedural states: Secondary | ICD-10-CM | POA: Diagnosis not present

## 2015-12-16 DIAGNOSIS — M67922 Unspecified disorder of synovium and tendon, left upper arm: Secondary | ICD-10-CM | POA: Diagnosis not present

## 2015-12-16 DIAGNOSIS — M25512 Pain in left shoulder: Secondary | ICD-10-CM

## 2015-12-16 DIAGNOSIS — R29898 Other symptoms and signs involving the musculoskeletal system: Secondary | ICD-10-CM | POA: Diagnosis not present

## 2015-12-16 DIAGNOSIS — M25612 Stiffness of left shoulder, not elsewhere classified: Secondary | ICD-10-CM

## 2015-12-16 DIAGNOSIS — M25622 Stiffness of left elbow, not elsewhere classified: Secondary | ICD-10-CM | POA: Diagnosis not present

## 2015-12-16 DIAGNOSIS — M25522 Pain in left elbow: Secondary | ICD-10-CM | POA: Diagnosis not present

## 2015-12-16 NOTE — Therapy (Signed)
[place], [place], [address] Phone: [phone]   Fax:  [phone]  Occupational Therapy Treatment  Patient Details  Name: [name] MRN: [id number] Date of Birth: 12-Apr-1956 Referring Provider: Dr. Joni Fears  Encounter Date: 12/16/2015      OT End of Session - 12/16/15 1514    Visit Number 3   Number of Visits 24   Date for OT Re-Evaluation 02/07/16  mini reassess on 01/09/16   Authorization Type UMR   OT Start Time 1430   OT Stop Time 1512   OT Time Calculation (min) 42 min   Activity Tolerance Patient tolerated treatment well   Behavior During Therapy [facility] for tasks assessed/performed      Past Medical History  Diagnosis Date  . Arteriosclerotic cardiovascular disease (ASCVD) 2003    Non-Q MI with stent to RCA in 12/2001, normal EF, additional stents to mid CX and D1; cath in 02/2005-progressive or eye disease, 50-70% mid LAD, 90% distal LAD, 50% D1, 60-70% CX, small PDA with diffuse disease, patent RCA stent; repeat cath in 01/2008-no significant progression; non-Q MI in 02/2010 with 100% mid Cx at  prior stent, nondom. RCA with patent stent, 50% RI, nl EF, CABG-04/2010  . Gastroesophageal reflux disease   . Hyperlipidemia     Predominantly elevated triglycerides  . Hypertension   . Anxiety   . Erectile dysfunction   . Chronic obstructive pulmonary disease ([place])   . Chronic kidney disease     Creatinine of 1.44 in 04/2009  . Tobacco abuse, in remission     35 pack years; discontinued 05/2010  . Abnormal LFTs   . Pneumonia 2006    2006  . Adenomatous colon polyp 2012  . Internal hemorrhoids   . Hepatic steatosis   . Full dentures   . Wears glasses   . Allergy     to Red Meat  . Myocardial infarction ([place]) 2002, 2011  . Coronary artery disease     stent 2011  . Diabetes mellitus     Past Surgical History  Procedure Laterality Date  . Appendectomy    . Colonoscopy w/ polypectomy   2012    Dr. Olevia Perches; 2 polyps removed one of which was precancerous  . Hemorrhoid surgery N/A 02/24/2014    Procedure: HEMORRHOIDECTOMY;  Surgeon: Joyice Faster. Cornett, MD;  Location: [place];  Service: General;  Laterality: N/A;  . Coronary artery bypass graft  05/2010    4 vessels August 2011  . Cataract extraction w/phaco Left 09/21/2014    Procedure: CATARACT EXTRACTION PHACO AND INTRAOCULAR LENS PLACEMENT (IOC);  Surgeon: Tonny Branch, MD;  Location: [facility];  Service: Ophthalmology;  Laterality: Left;  CDE:9.77  . Eye surgery    . Shoulder arthroscopy with open rotator cuff repair and distal clavicle acrominectomy Left 11/23/2015    Procedure: SHOULDER ARTHROSCOPY WITH MINI-OPEN ROTATOR CUFF REPAIR, DISTAL CLAVICLE RESECTION AND SUBACROMIAL DECOMPRESSION.;  Surgeon: Garald Balding, MD;  Location: [place];  Service: Orthopedics;  Laterality: Left;  LEFT SHOULDER ARTHROSCOPIC SUBACROMIAL DECOMPRESSION, DISTAL CLAVICLE RESECTION, MINI-OPEN ROTATOR CUFF REPAIR.    There were no vitals filed for this visit.  Visit Diagnosis:  Shoulder pain, acute, left  Shoulder joint stiffness, left  Shoulder weakness      Subjective Assessment - 12/16/15 1512    Subjective  S:  I can feel it pulling but it is really getting better, I can stretch it so much farther.  Currently in Pain? Yes   Pain Score 3    Pain Location Shoulder   Pain Orientation Left   Pain Descriptors / Indicators Aching   Pain Type Acute pain   Pain Radiating Towards upper arm   Pain Onset 1 to 4 weeks ago   Pain Frequency Intermittent   Aggravating Factors  movement of arm above waist height   Pain Relieving Factors rest ice   Effect of Pain on Daily Activities decreased independence with functional activities             [facility] OT Assessment - 12/16/15 0001    Assessment   Diagnosis S/P Left Rotator Cuff Repair and Biceps Tenodesis   Precautions   Precautions Shoulder   Type of Shoulder Precautions  sling at all times, P/ROM only to left shoulder and elbow for 4 weeks through 12/02/15                  OT Treatments/Exercises (OP) - 12/16/15 0001    Exercises   Exercises Shoulder   Shoulder Exercises: Supine   Protraction PROM;10 reps   Horizontal ABduction PROM;10 reps   External Rotation PROM;10 reps   Internal Rotation PROM;10 reps   Flexion PROM;10 reps   ABduction PROM;10 reps   Other Supine Exercises bridges 10 times    Shoulder Exercises: Seated   Elevation AROM;15 reps   Extension AROM;15 reps   Row AROM;15 reps   Shoulder Exercises: Therapy Ball   Flexion 15 reps   ABduction 15 reps   Shoulder Exercises: ROM/Strengthening   Anterior Glide 5 X 5"   Caudal Glide 5 X 5"   Shoulder Exercises: Isometric Strengthening   Flexion Supine;3X5"   Extension Supine;3X5"   External Rotation Supine;3X5"   Internal Rotation Supine;3X5"   ABduction Supine;3X5"   ADduction Supine;3X5"   Manual Therapy   Manual Therapy Myofascial release   Manual therapy comments manual therapy completed seperately from all other interventions this date   Myofascial Release myofascial release and manual stretching to left upper arm, biceps region, scapular region and shoulder region and associated areas to decrease pain and restrictions and improve pain free mobility in his left shoulder region.                   OT Short Term Goals - 12/14/15 1632    OT SHORT TERM GOAL #1   Title Patient will be educated on a HEP for left shoulder and elbow mobility needed to return to PLOF with daily activities.    Time 6   Period Weeks   Status On-going   OT SHORT TERM GOAL #2   Title Patient will improve P/ROM of left shoulder to [facility] in order to improve independence with tying his shoes.    Time 6   Period Weeks   Status On-going   OT SHORT TERM GOAL #3   Title Patietn will demonstrate 3+/5 strength in his left shoulder and elbow for improved ability to lift equipment at work.     Time 6   Period Weeks   Status On-going   OT SHORT TERM GOAL #4   Title Patient will decrease pain in his left shoulder to 3/10 or better when lying on his back at night.    Time 6   Period Weeks   Status On-going   OT SHORT TERM GOAL #5   Title Patient will demonstrate min-mod fascial restrictions in his left shoulder region for greater mobility needed to use left  shoulder as an active assist with daily tasks.    Time 6   Period Weeks   Status On-going           OT Long Term Goals - 12/14/15 1632    OT LONG TERM GOAL #1   Title Patient will use his left arm 100% with all daily, work, and leisure activities at prior level of function.   Time 12   Period Weeks   Status On-going   OT LONG TERM GOAL #2   Title Patient will have WNL A/ROM in his left shoulder and elbow in order to be able to tuck his shirt in and reach overhead to complete necessary work activities.    Time 12   Period Weeks   Status On-going   OT LONG TERM GOAL #3   Title Patient will have 5/5 strength in his left shoulder and elbow in order to pick up ladders an other equipment at work.    Time 12   Period Weeks   Status On-going   OT LONG TERM GOAL #4   Title Patient will have 1/10 pain or less in his left shoulder when lying on his back or reaching overhead.    Time 12   Period Weeks   Status On-going   OT LONG TERM GOAL #5   Title Patient will demonstrate trace fascial restrictions in his left shoulder and elbow region in order to have the necessary pain free range of motion needed for ADL completion.    Time 12   Period Weeks   Status On-going               Plan - 12/16/15 1515    Clinical Impression Statement A:  increased contraction time to 5" with good form.  significant increase in P/ROM of shoulder flexion, abduction this date, external rotation improving, although not as rapidly.   Plan P:  continue to improve P/ROM for improved independence with B/IADLs and work activities.     Consulted and Agree with Plan of Care Patient        Problem List Patient Active Problem List   Diagnosis Date Noted  . Partial tear of left rotator cuff 11/23/2015  . Biceps tendon tear 11/23/2015  . Impingement syndrome of left shoulder 11/23/2015  . Post-operative state 03/13/2014  . Hemorrhoids 01/19/2014  . Arteriosclerotic cardiovascular disease (ASCVD)   . Diabetes mellitus, type II ([place])   . Gastroesophageal reflux disease   . Hyperlipidemia   . Hypertension   . Chronic kidney disease   . Tobacco abuse, in remission   . ERECTILE DYSFUNCTION, NON-ORGANIC 10/06/2009    Vangie Bicker, OTR/L [phone]  12/16/2015, 3:17 PM  [place] [address] [place], [place], [address] Phone: [phone]   Fax:  [phone]  Name: [name] MRN: [id number] Date of Birth: [birth year]

## 2015-12-20 ENCOUNTER — Telehealth (HOSPITAL_COMMUNITY): Payer: Self-pay

## 2015-12-20 ENCOUNTER — Ambulatory Visit (HOSPITAL_COMMUNITY): Payer: 59

## 2015-12-20 MED FILL — TRESIBA FLEXTOUCH 200 UNITS: 200 | 30 days supply | Qty: 18 | Fill #2

## 2015-12-20 NOTE — Telephone Encounter (Signed)
DATE: 12/20/15  Called patient regarding missed appointment today at 1:45PM. Pt mixed up appointment date and thought he was scheduled for tomorrow instead of today (12/20/15). Reminded patient of next appointment.  Ailene Ravel, OTR/L,CBIS  [phone]

## 2015-12-22 ENCOUNTER — Encounter (HOSPITAL_COMMUNITY): Payer: 59 | Admitting: Specialist

## 2015-12-24 ENCOUNTER — Encounter (HOSPITAL_COMMUNITY): Payer: 59

## 2015-12-28 ENCOUNTER — Ambulatory Visit (HOSPITAL_COMMUNITY): Payer: 59 | Admitting: Occupational Therapy

## 2015-12-28 ENCOUNTER — Encounter (HOSPITAL_COMMUNITY): Payer: Self-pay | Admitting: Occupational Therapy

## 2015-12-28 DIAGNOSIS — M25512 Pain in left shoulder: Secondary | ICD-10-CM

## 2015-12-28 DIAGNOSIS — M25522 Pain in left elbow: Secondary | ICD-10-CM

## 2015-12-28 DIAGNOSIS — M67922 Unspecified disorder of synovium and tendon, left upper arm: Secondary | ICD-10-CM | POA: Diagnosis not present

## 2015-12-28 DIAGNOSIS — Z9889 Other specified postprocedural states: Secondary | ICD-10-CM | POA: Diagnosis not present

## 2015-12-28 DIAGNOSIS — M25622 Stiffness of left elbow, not elsewhere classified: Secondary | ICD-10-CM

## 2015-12-28 DIAGNOSIS — R29898 Other symptoms and signs involving the musculoskeletal system: Secondary | ICD-10-CM

## 2015-12-28 DIAGNOSIS — M25612 Stiffness of left shoulder, not elsewhere classified: Secondary | ICD-10-CM

## 2015-12-28 NOTE — Patient Instructions (Signed)
1) Seated Row   Sit up straight with elbows by your sides. Pull back with shoulders/elbows, keeping forearms straight, as if pulling back on the reins of a horse. Squeeze shoulder blades together. Repeat _15__times, _1-2___sets/day    2) Shoulder Elevation    Sit up straight with arms by your sides. Slowly bring your shoulders up towards your ears. Repeat_15__times, _1-2___ sets/day    3) Shoulder Extension    Sit up straight with both arms by your side, draw your arms back behind your waist. Keep your elbows straight. Repeat __15__times, __1-2__sets/day.        

## 2015-12-28 NOTE — Therapy (Signed)
[place], [place], [address] Phone: [phone]   Fax:  [phone]  Occupational Therapy Treatment  Patient Details  Name: [name] MRN: [id number] Date of Birth: 12-Jun-1956 Referring Provider: Dr. Joni Fears  Encounter Date: 12/28/2015      OT End of Session - 12/28/15 1627    Visit Number 4   Number of Visits 24   Date for OT Re-Evaluation 02/07/16  mini reassess on 01/09/16   Authorization Type UMR   OT Start Time 1435   OT Stop Time 1513   OT Time Calculation (min) 38 min   Activity Tolerance Patient tolerated treatment well   Behavior During Therapy [facility] for tasks assessed/performed      Past Medical History  Diagnosis Date  . Arteriosclerotic cardiovascular disease (ASCVD) 2003    Non-Q MI with stent to RCA in 12/2001, normal EF, additional stents to mid CX and D1; cath in 02/2005-progressive or eye disease, 50-70% mid LAD, 90% distal LAD, 50% D1, 60-70% CX, small PDA with diffuse disease, patent RCA stent; repeat cath in 01/2008-no significant progression; non-Q MI in 02/2010 with 100% mid Cx at  prior stent, nondom. RCA with patent stent, 50% RI, nl EF, CABG-04/2010  . Gastroesophageal reflux disease   . Hyperlipidemia     Predominantly elevated triglycerides  . Hypertension   . Anxiety   . Erectile dysfunction   . Chronic obstructive pulmonary disease ([place])   . Chronic kidney disease     Creatinine of 1.44 in 04/2009  . Tobacco abuse, in remission     35 pack years; discontinued 05/2010  . Abnormal LFTs   . Pneumonia 2006    2006  . Adenomatous colon polyp 2012  . Internal hemorrhoids   . Hepatic steatosis   . Full dentures   . Wears glasses   . Allergy     to Red Meat  . Myocardial infarction ([place]) 2002, 2011  . Coronary artery disease     stent 2011  . Diabetes mellitus     Past Surgical History  Procedure Laterality Date  . Appendectomy    . Colonoscopy w/ polypectomy   2012    Dr. Olevia Perches; 2 polyps removed one of which was precancerous  . Hemorrhoid surgery N/A 02/24/2014    Procedure: HEMORRHOIDECTOMY;  Surgeon: Joyice Faster. Cornett, MD;  Location: [place];  Service: General;  Laterality: N/A;  . Coronary artery bypass graft  05/2010    4 vessels August 2011  . Cataract extraction w/phaco Left 09/21/2014    Procedure: CATARACT EXTRACTION PHACO AND INTRAOCULAR LENS PLACEMENT (IOC);  Surgeon: Tonny Branch, MD;  Location: [facility];  Service: Ophthalmology;  Laterality: Left;  CDE:9.77  . Eye surgery    . Shoulder arthroscopy with open rotator cuff repair and distal clavicle acrominectomy Left 11/23/2015    Procedure: SHOULDER ARTHROSCOPY WITH MINI-OPEN ROTATOR CUFF REPAIR, DISTAL CLAVICLE RESECTION AND SUBACROMIAL DECOMPRESSION.;  Surgeon: Garald Balding, MD;  Location: [place];  Service: Orthopedics;  Laterality: Left;  LEFT SHOULDER ARTHROSCOPIC SUBACROMIAL DECOMPRESSION, DISTAL CLAVICLE RESECTION, MINI-OPEN ROTATOR CUFF REPAIR.    There were no vitals filed for this visit.  Visit Diagnosis:  Shoulder pain, acute, left  Shoulder joint stiffness, left  Shoulder weakness  Elbow pain, left  Joint stiffness of elbow, left      Subjective Assessment - 12/28/15 1437    Subjective  S: My surgery was on Valentines day.    Currently  in Pain? No/denies            [facility] OT Assessment - 12/28/15 1515    Assessment   Diagnosis S/P Left Rotator Cuff Repair and Biceps Tenodesis   Precautions   Precautions Shoulder   Type of Shoulder Precautions sling at all times, P/ROM only to left shoulder and elbow for 4 weeks through 12/02/15                  OT Treatments/Exercises (OP) - 12/28/15 1452    Exercises   Exercises Shoulder   Shoulder Exercises: Supine   Protraction PROM;10 reps   Horizontal ABduction PROM;10 reps   External Rotation PROM;10 reps   Internal Rotation PROM;10 reps   Flexion PROM;10 reps   ABduction PROM;10 reps    Shoulder Exercises: Seated   Elevation AROM;15 reps   Extension AROM;15 reps   Row AROM;15 reps   Shoulder Exercises: Therapy Ball   Flexion 15 reps   ABduction 15 reps   Shoulder Exercises: ROM/Strengthening   Thumb Tacks low thumb tacks 1'   Anterior Glide 5 X 5"   Shoulder Exercises: Isometric Strengthening   Flexion Supine;5X5"   Extension Supine;5X5"   External Rotation Supine;5X5"   Internal Rotation Supine;5X5"   ABduction Supine;5X5"   ADduction Supine;5X5"   Manual Therapy   Manual Therapy Myofascial release   Manual therapy comments manual therapy completed seperately from all other interventions this date   Myofascial Release myofascial release and manual stretching to left upper arm, biceps region, scapular region and shoulder region and associated areas to decrease pain and restrictions and improve pain free mobility in his left shoulder region.                 OT Education - 12/28/15 1501    Education provided Yes   Education Details scapular A/ROM exercises   Person(s) Educated Patient;Spouse   Methods Explanation;Handout   Comprehension Verbalized understanding;Returned demonstration          OT Short Term Goals - 12/14/15 1632    OT SHORT TERM GOAL #1   Title Patient will be educated on a HEP for left shoulder and elbow mobility needed to return to PLOF with daily activities.    Time 6   Period Weeks   Status On-going   OT SHORT TERM GOAL #2   Title Patient will improve P/ROM of left shoulder to [facility] in order to improve independence with tying his shoes.    Time 6   Period Weeks   Status On-going   OT SHORT TERM GOAL #3   Title Patietn will demonstrate 3+/5 strength in his left shoulder and elbow for improved ability to lift equipment at work.    Time 6   Period Weeks   Status On-going   OT SHORT TERM GOAL #4   Title Patient will decrease pain in his left shoulder to 3/10 or better when lying on his back at night.    Time 6   Period  Weeks   Status On-going   OT SHORT TERM GOAL #5   Title Patient will demonstrate min-mod fascial restrictions in his left shoulder region for greater mobility needed to use left shoulder as an active assist with daily tasks.    Time 6   Period Weeks   Status On-going           OT Long Term Goals - 12/14/15 1632    OT LONG TERM GOAL #1   Title Patient will  use his left arm 100% with all daily, work, and leisure activities at prior level of function.   Time 12   Period Weeks   Status On-going   OT LONG TERM GOAL #2   Title Patient will have WNL A/ROM in his left shoulder and elbow in order to be able to tuck his shirt in and reach overhead to complete necessary work activities.    Time 12   Period Weeks   Status On-going   OT LONG TERM GOAL #3   Title Patient will have 5/5 strength in his left shoulder and elbow in order to pick up ladders an other equipment at work.    Time 12   Period Weeks   Status On-going   OT LONG TERM GOAL #4   Title Patient will have 1/10 pain or less in his left shoulder when lying on his back or reaching overhead.    Time 12   Period Weeks   Status On-going   OT LONG TERM GOAL #5   Title Patient will demonstrate trace fascial restrictions in his left shoulder and elbow region in order to have the necessary pain free range of motion needed for ADL completion.    Time 12   Period Weeks   Status On-going               Plan - 12/28/15 1627    Clinical Impression Statement A: Increase isometrics to 5x5" this session, added low thumb tacks. Pt with P/ROM WFL in all ranges, reporting significantly less pain. Provided pt with scapular A/ROM HEP this session.    Plan P: Continue working towards P/ROM WNL for increased ability to perform functional tasks. Add low wall wash.         Problem List Patient Active Problem List   Diagnosis Date Noted  . Partial tear of left rotator cuff 11/23/2015  . Biceps tendon tear 11/23/2015  . Impingement  syndrome of left shoulder 11/23/2015  . Post-operative state 03/13/2014  . Hemorrhoids 01/19/2014  . Arteriosclerotic cardiovascular disease (ASCVD)   . Diabetes mellitus, type II ([place])   . Gastroesophageal reflux disease   . Hyperlipidemia   . Hypertension   . Chronic kidney disease   . Tobacco abuse, in remission   . ERECTILE DYSFUNCTION, NON-ORGANIC 10/06/2009    Guadelupe Sabin, OTR/L  [phone]  12/28/2015, 4:29 PM  [place] [address] [place], [place], [address] Phone: [phone]   Fax:  [phone]  Name: [name] MRN: [id number] Date of Birth: 12/21/1955

## 2015-12-29 NOTE — Addendum Note (Signed)
Addendum  created 12/29/15 1317 by Scheryl Darter, CRNA   Modules edited: Notes Section   Notes Section:  File: [id number]

## 2015-12-31 ENCOUNTER — Ambulatory Visit (HOSPITAL_COMMUNITY): Payer: 59 | Admitting: Occupational Therapy

## 2015-12-31 ENCOUNTER — Encounter (HOSPITAL_COMMUNITY): Payer: Self-pay | Admitting: Occupational Therapy

## 2015-12-31 DIAGNOSIS — M25612 Stiffness of left shoulder, not elsewhere classified: Secondary | ICD-10-CM

## 2015-12-31 DIAGNOSIS — R29898 Other symptoms and signs involving the musculoskeletal system: Secondary | ICD-10-CM | POA: Diagnosis not present

## 2015-12-31 DIAGNOSIS — M25512 Pain in left shoulder: Secondary | ICD-10-CM

## 2015-12-31 DIAGNOSIS — M25522 Pain in left elbow: Secondary | ICD-10-CM | POA: Diagnosis not present

## 2015-12-31 DIAGNOSIS — M25622 Stiffness of left elbow, not elsewhere classified: Secondary | ICD-10-CM | POA: Diagnosis not present

## 2015-12-31 DIAGNOSIS — Z9889 Other specified postprocedural states: Secondary | ICD-10-CM | POA: Diagnosis not present

## 2015-12-31 DIAGNOSIS — M67922 Unspecified disorder of synovium and tendon, left upper arm: Secondary | ICD-10-CM | POA: Diagnosis not present

## 2015-12-31 NOTE — Therapy (Signed)
[place], [place], [address] Phone: [phone]   Fax:  [phone]  Occupational Therapy Treatment  Patient Details  Name: [name] MRN: [id number] Date of Birth: 11/17/1955 Referring Provider: Dr. Joni Fears  Encounter Date: 12/31/2015      OT End of Session - 12/31/15 1531    Visit Number 5   Number of Visits 24   Date for OT Re-Evaluation 02/07/16  mini reassess on 01/09/16   Authorization Type UMR   OT Start Time 1436   OT Stop Time 1515   OT Time Calculation (min) 39 min   Activity Tolerance Patient tolerated treatment well   Behavior During Therapy [facility] for tasks assessed/performed      Past Medical History  Diagnosis Date  . Arteriosclerotic cardiovascular disease (ASCVD) 2003    Non-Q MI with stent to RCA in 12/2001, normal EF, additional stents to mid CX and D1; cath in 02/2005-progressive or eye disease, 50-70% mid LAD, 90% distal LAD, 50% D1, 60-70% CX, small PDA with diffuse disease, patent RCA stent; repeat cath in 01/2008-no significant progression; non-Q MI in 02/2010 with 100% mid Cx at  prior stent, nondom. RCA with patent stent, 50% RI, nl EF, CABG-04/2010  . Gastroesophageal reflux disease   . Hyperlipidemia     Predominantly elevated triglycerides  . Hypertension   . Anxiety   . Erectile dysfunction   . Chronic obstructive pulmonary disease ([place])   . Chronic kidney disease     Creatinine of 1.44 in 04/2009  . Tobacco abuse, in remission     35 pack years; discontinued 05/2010  . Abnormal LFTs   . Pneumonia 2006    2006  . Adenomatous colon polyp 2012  . Internal hemorrhoids   . Hepatic steatosis   . Full dentures   . Wears glasses   . Allergy     to Red Meat  . Myocardial infarction ([place]) 2002, 2011  . Coronary artery disease     stent 2011  . Diabetes mellitus     Past Surgical History  Procedure Laterality Date  . Appendectomy    . Colonoscopy w/ polypectomy   2012    Dr. Olevia Perches; 2 polyps removed one of which was precancerous  . Hemorrhoid surgery N/A 02/24/2014    Procedure: HEMORRHOIDECTOMY;  Surgeon: Joyice Faster. Cornett, MD;  Location: [place];  Service: General;  Laterality: N/A;  . Coronary artery bypass graft  05/2010    4 vessels August 2011  . Cataract extraction w/phaco Left 09/21/2014    Procedure: CATARACT EXTRACTION PHACO AND INTRAOCULAR LENS PLACEMENT (IOC);  Surgeon: Tonny Branch, MD;  Location: [facility];  Service: Ophthalmology;  Laterality: Left;  CDE:9.77  . Eye surgery    . Shoulder arthroscopy with open rotator cuff repair and distal clavicle acrominectomy Left 11/23/2015    Procedure: SHOULDER ARTHROSCOPY WITH MINI-OPEN ROTATOR CUFF REPAIR, DISTAL CLAVICLE RESECTION AND SUBACROMIAL DECOMPRESSION.;  Surgeon: Garald Balding, MD;  Location: [place];  Service: Orthopedics;  Laterality: Left;  LEFT SHOULDER ARTHROSCOPIC SUBACROMIAL DECOMPRESSION, DISTAL CLAVICLE RESECTION, MINI-OPEN ROTATOR CUFF REPAIR.    There were no vitals filed for this visit.  Visit Diagnosis:  Shoulder pain, acute, left  Shoulder joint stiffness, left  Shoulder weakness      Subjective Assessment - 12/31/15 1438    Subjective  S: He told me I could go without my sling now.    Currently in Pain? No/denies  [facility] OT Assessment - 12/31/15 1437    Assessment   Diagnosis S/P Left Rotator Cuff Repair and Biceps Tenodesis   Precautions   Precautions Shoulder   Type of Shoulder Precautions sling at all times, P/ROM only to left shoulder and elbow for 4 weeks through 12/02/15                  OT Treatments/Exercises (OP) - 12/31/15 1438    Exercises   Exercises Shoulder   Shoulder Exercises: Supine   Protraction PROM;10 reps   Horizontal ABduction PROM;10 reps   External Rotation PROM;10 reps   Internal Rotation PROM;10 reps   Flexion PROM;10 reps   ABduction PROM;10 reps   Shoulder Exercises: Seated   Elevation  AROM;15 reps   Extension AROM;15 reps   Row AROM;15 reps   Shoulder Exercises: Pulleys   Flexion 1 minute   ABduction 1 minute   Shoulder Exercises: Therapy Ball   Flexion 15 reps   ABduction 15 reps   Shoulder Exercises: ROM/Strengthening   Wall Wash 1'   Thumb Tacks 1'   Prot/Ret//Elev/Dep 1'   Manual Therapy   Manual Therapy Myofascial release   Manual therapy comments manual therapy completed seperately from all other interventions this date   Myofascial Release myofascial release and manual stretching to left upper arm, biceps region, scapular region and shoulder region and associated areas to decrease pain and restrictions and improve pain free mobility in his left shoulder region.                   OT Short Term Goals - 12/14/15 1632    OT SHORT TERM GOAL #1   Title Patient will be educated on a HEP for left shoulder and elbow mobility needed to return to PLOF with daily activities.    Time 6   Period Weeks   Status On-going   OT SHORT TERM GOAL #2   Title Patient will improve P/ROM of left shoulder to [facility] in order to improve independence with tying his shoes.    Time 6   Period Weeks   Status On-going   OT SHORT TERM GOAL #3   Title Patietn will demonstrate 3+/5 strength in his left shoulder and elbow for improved ability to lift equipment at work.    Time 6   Period Weeks   Status On-going   OT SHORT TERM GOAL #4   Title Patient will decrease pain in his left shoulder to 3/10 or better when lying on his back at night.    Time 6   Period Weeks   Status On-going   OT SHORT TERM GOAL #5   Title Patient will demonstrate min-mod fascial restrictions in his left shoulder region for greater mobility needed to use left shoulder as an active assist with daily tasks.    Time 6   Period Weeks   Status On-going           OT Long Term Goals - 12/14/15 1632    OT LONG TERM GOAL #1   Title Patient will use his left arm 100% with all daily, work, and leisure  activities at prior level of function.   Time 12   Period Weeks   Status On-going   OT LONG TERM GOAL #2   Title Patient will have WNL A/ROM in his left shoulder and elbow in order to be able to tuck his shirt in and reach overhead to complete necessary work activities.    Time  12   Period Weeks   Status On-going   OT LONG TERM GOAL #3   Title Patient will have 5/5 strength in his left shoulder and elbow in order to pick up ladders an other equipment at work.    Time 12   Period Weeks   Status On-going   OT LONG TERM GOAL #4   Title Patient will have 1/10 pain or less in his left shoulder when lying on his back or reaching overhead.    Time 12   Period Weeks   Status On-going   OT LONG TERM GOAL #5   Title Patient will demonstrate trace fascial restrictions in his left shoulder and elbow region in order to have the necessary pain free range of motion needed for ADL completion.    Time 12   Period Weeks   Status On-going               Plan - 12/31/15 1532    Clinical Impression Statement A: Added wall wash, prot/ret/elev/dep, and pulleys this session. Pt with P/ROM WFL, no pain reported at end of session. Pt reports he went to the MD this week and is able to take his sling off for short periods, and pt is able to begin moving his arm around at home.    Plan P: Week 6-begin AA/ROM in supine        Problem List Patient Active Problem List   Diagnosis Date Noted  . Partial tear of left rotator cuff 11/23/2015  . Biceps tendon tear 11/23/2015  . Impingement syndrome of left shoulder 11/23/2015  . Post-operative state 03/13/2014  . Hemorrhoids 01/19/2014  . Arteriosclerotic cardiovascular disease (ASCVD)   . Diabetes mellitus, type II ([place])   . Gastroesophageal reflux disease   . Hyperlipidemia   . Hypertension   . Chronic kidney disease   . Tobacco abuse, in remission   . ERECTILE DYSFUNCTION, NON-ORGANIC 10/06/2009    Guadelupe Sabin, OTR/L  [phone]   12/31/2015, 3:33 PM  [place], [place], [address] Phone: [phone]   Fax:  [phone]  Name: RAYNARD MAPPS MRN: [id number] Date of Birth: 1956/04/13

## 2016-01-03 ENCOUNTER — Ambulatory Visit (HOSPITAL_COMMUNITY): Payer: 59

## 2016-01-03 ENCOUNTER — Encounter (HOSPITAL_COMMUNITY): Payer: Self-pay

## 2016-01-03 DIAGNOSIS — R29898 Other symptoms and signs involving the musculoskeletal system: Secondary | ICD-10-CM | POA: Diagnosis not present

## 2016-01-03 DIAGNOSIS — M67922 Unspecified disorder of synovium and tendon, left upper arm: Secondary | ICD-10-CM | POA: Diagnosis not present

## 2016-01-03 DIAGNOSIS — M25512 Pain in left shoulder: Secondary | ICD-10-CM

## 2016-01-03 DIAGNOSIS — M25622 Stiffness of left elbow, not elsewhere classified: Secondary | ICD-10-CM | POA: Diagnosis not present

## 2016-01-03 DIAGNOSIS — M25612 Stiffness of left shoulder, not elsewhere classified: Secondary | ICD-10-CM

## 2016-01-03 DIAGNOSIS — M25522 Pain in left elbow: Secondary | ICD-10-CM | POA: Diagnosis not present

## 2016-01-03 DIAGNOSIS — Z9889 Other specified postprocedural states: Secondary | ICD-10-CM | POA: Diagnosis not present

## 2016-01-03 NOTE — Therapy (Signed)
[place], [place], [address] Phone: [phone]   Fax:  [phone]  Occupational Therapy Treatment  Patient Details  Name: [name] MRN: [id number] Date of Birth: [birth year] Referring Provider: Dr. Joni Fears  Encounter Date: 01/03/2016      OT End of Session - 01/03/16 1606    Visit Number 6   Number of Visits 24   Date for OT Re-Evaluation 02/07/16  mini reassess on 01/09/16   Authorization Type UMR   OT Start Time 1525   OT Stop Time 1605   OT Time Calculation (min) 40 min   Activity Tolerance Patient tolerated treatment well   Behavior During Therapy [facility] for tasks assessed/performed      Past Medical History  Diagnosis Date  . Arteriosclerotic cardiovascular disease (ASCVD) 2003    Non-Q MI with stent to RCA in 12/2001, normal EF, additional stents to mid CX and D1; cath in 02/2005-progressive or eye disease, 50-70% mid LAD, 90% distal LAD, 50% D1, 60-70% CX, small PDA with diffuse disease, patent RCA stent; repeat cath in 01/2008-no significant progression; non-Q MI in 02/2010 with 100% mid Cx at  prior stent, nondom. RCA with patent stent, 50% RI, nl EF, CABG-04/2010  . Gastroesophageal reflux disease   . Hyperlipidemia     Predominantly elevated triglycerides  . Hypertension   . Anxiety   . Erectile dysfunction   . Chronic obstructive pulmonary disease ([place])   . Chronic kidney disease     Creatinine of 1.44 in 04/2009  . Tobacco abuse, in remission     35 pack years; discontinued 05/2010  . Abnormal LFTs   . Pneumonia 2006    2006  . Adenomatous colon polyp 2012  . Internal hemorrhoids   . Hepatic steatosis   . Full dentures   . Wears glasses   . Allergy     to Red Meat  . Myocardial infarction ([place]) 2002, 2011  . Coronary artery disease     stent 2011  . Diabetes mellitus     Past Surgical History  Procedure Laterality Date  . Appendectomy    . Colonoscopy w/ polypectomy   2012    Dr. Olevia Perches; 2 polyps removed one of which was precancerous  . Hemorrhoid surgery N/A 02/24/2014    Procedure: HEMORRHOIDECTOMY;  Surgeon: Joyice Faster. Cornett, MD;  Location: [place];  Service: General;  Laterality: N/A;  . Coronary artery bypass graft  05/2010    4 vessels August 2011  . Cataract extraction w/phaco Left 09/21/2014    Procedure: CATARACT EXTRACTION PHACO AND INTRAOCULAR LENS PLACEMENT (IOC);  Surgeon: Tonny Branch, MD;  Location: [facility];  Service: Ophthalmology;  Laterality: Left;  CDE:9.77  . Eye surgery    . Shoulder arthroscopy with open rotator cuff repair and distal clavicle acrominectomy Left 11/23/2015    Procedure: SHOULDER ARTHROSCOPY WITH MINI-OPEN ROTATOR CUFF REPAIR, DISTAL CLAVICLE RESECTION AND SUBACROMIAL DECOMPRESSION.;  Surgeon: Garald Balding, MD;  Location: [place];  Service: Orthopedics;  Laterality: Left;  LEFT SHOULDER ARTHROSCOPIC SUBACROMIAL DECOMPRESSION, DISTAL CLAVICLE RESECTION, MINI-OPEN ROTATOR CUFF REPAIR.    There were no vitals filed for this visit.  Visit Diagnosis:  Shoulder pain, acute, left  Shoulder joint stiffness, left  Shoulder weakness      Subjective Assessment - 01/03/16 1528    Subjective  Wearing out, I'm just wearing out.   Currently in Pain? No/denies  [facility] OT Assessment - 01/03/16 1529    Assessment   Diagnosis S/P Left Rotator Cuff Repair and Biceps Tenodesis   Precautions   Precautions Shoulder   Type of Shoulder Precautions sling at all times, P/ROM only to left shoulder and elbow for 4 weeks through 12/02/15                  OT Treatments/Exercises (OP) - 01/03/16 1529    Exercises   Exercises Shoulder   Shoulder Exercises: Supine   Protraction PROM;AAROM;10 reps   Horizontal ABduction PROM;AAROM;10 reps   External Rotation PROM;AAROM;10 reps   Internal Rotation PROM;AAROM;10 reps   Flexion PROM;AAROM;10 reps   ABduction PROM;AAROM;10 reps   Shoulder  Exercises: Standing   Protraction AAROM;10 reps   Horizontal ABduction AAROM;10 reps   External Rotation AAROM;10 reps   Internal Rotation AAROM;10 reps   Flexion AAROM;10 reps   ABduction AAROM;10 reps   Shoulder Exercises: Pulleys   Flexion 1 minute   ABduction 1 minute   Shoulder Exercises: ROM/Strengthening   Wall Wash 1'   Thumb Tacks 1'   Manual Therapy   Manual Therapy Myofascial release   Manual therapy comments manual therapy completed seperately from all other interventions this date   Myofascial Release myofascial release and manual stretching to left upper arm, biceps region, scapular region and shoulder region and associated areas to decrease pain and restrictions and improve pain free mobility in his left shoulder region.                 OT Education - 01/03/16 1605    Education provided Yes   Education Details AA/ROM HEP and education provided.   Person(s) Educated Patient   Methods Explanation;Demonstration;Handout   Comprehension Verbalized understanding          OT Short Term Goals - 12/14/15 1632    OT SHORT TERM GOAL #1   Title Patient will be educated on a HEP for left shoulder and elbow mobility needed to return to PLOF with daily activities.    Time 6   Period Weeks   Status On-going   OT SHORT TERM GOAL #2   Title Patient will improve P/ROM of left shoulder to [facility] in order to improve independence with tying his shoes.    Time 6   Period Weeks   Status On-going   OT SHORT TERM GOAL #3   Title Patietn will demonstrate 3+/5 strength in his left shoulder and elbow for improved ability to lift equipment at work.    Time 6   Period Weeks   Status On-going   OT SHORT TERM GOAL #4   Title Patient will decrease pain in his left shoulder to 3/10 or better when lying on his back at night.    Time 6   Period Weeks   Status On-going   OT SHORT TERM GOAL #5   Title Patient will demonstrate min-mod fascial restrictions in his left shoulder region  for greater mobility needed to use left shoulder as an active assist with daily tasks.    Time 6   Period Weeks   Status On-going           OT Long Term Goals - 12/14/15 1632    OT LONG TERM GOAL #1   Title Patient will use his left arm 100% with all daily, work, and leisure activities at prior level of function.   Time 12   Period Weeks   Status On-going   OT LONG  TERM GOAL #2   Title Patient will have WNL A/ROM in his left shoulder and elbow in order to be able to tuck his shirt in and reach overhead to complete necessary work activities.    Time 12   Period Weeks   Status On-going   OT LONG TERM GOAL #3   Title Patient will have 5/5 strength in his left shoulder and elbow in order to pick up ladders an other equipment at work.    Time 12   Period Weeks   Status On-going   OT LONG TERM GOAL #4   Title Patient will have 1/10 pain or less in his left shoulder when lying on his back or reaching overhead.    Time 12   Period Weeks   Status On-going   OT LONG TERM GOAL #5   Title Patient will demonstrate trace fascial restrictions in his left shoulder and elbow region in order to have the necessary pain free range of motion needed for ADL completion.    Time 12   Period Weeks   Status On-going               Plan - 01/03/16 1607    Clinical Impression Statement A: AA/ROM exercises added in supine and standing, patient tolerated well and was sent home with updated HEP. Continued with exercises added last session as well as myofascial release, manual therapy, and P/ROM exercises.   Plan P: Mini reassessment this session. Follow up on HEP completion. Continue with AA/ROM exercises and manual therapy.        Problem List Patient Active Problem List   Diagnosis Date Noted  . Partial tear of left rotator cuff 11/23/2015  . Biceps tendon tear 11/23/2015  . Impingement syndrome of left shoulder 11/23/2015  . Post-operative state 03/13/2014  . Hemorrhoids 01/19/2014   . Arteriosclerotic cardiovascular disease (ASCVD)   . Diabetes mellitus, type II ([place])   . Gastroesophageal reflux disease   . Hyperlipidemia   . Hypertension   . Chronic kidney disease   . Tobacco abuse, in remission   . ERECTILE DYSFUNCTION, NON-ORGANIC 10/06/2009    Ailene Ravel, OTR/L,CBIS  [phone]  01/03/2016, 4:15 PM  [place] [address] [place], [place], [address] Phone: [phone]   Fax:  [phone]  Name: [name] MRN: [id number] Date of Birth: 18-Oct-1955

## 2016-01-03 NOTE — Patient Instructions (Signed)

## 2016-01-04 MED FILL — metFORMIN HCL 1000 MG TABS: 1000 | 90 days supply | Qty: 180 | Fill #3

## 2016-01-04 MED FILL — LISINOPRIL-HCTZ 10-12.5 MG: 10-12.5 | 90 days supply | Qty: 90 | Fill #3

## 2016-01-04 MED FILL — AMLODIPINE BESYLATE 10 MG T: 10 | 90 days supply | Qty: 90 | Fill #2

## 2016-01-05 ENCOUNTER — Ambulatory Visit (HOSPITAL_COMMUNITY): Payer: 59

## 2016-01-05 ENCOUNTER — Encounter (HOSPITAL_COMMUNITY): Payer: Self-pay

## 2016-01-05 DIAGNOSIS — M25612 Stiffness of left shoulder, not elsewhere classified: Secondary | ICD-10-CM | POA: Diagnosis not present

## 2016-01-05 DIAGNOSIS — M25522 Pain in left elbow: Secondary | ICD-10-CM | POA: Diagnosis not present

## 2016-01-05 DIAGNOSIS — Z9889 Other specified postprocedural states: Secondary | ICD-10-CM | POA: Diagnosis not present

## 2016-01-05 DIAGNOSIS — R29898 Other symptoms and signs involving the musculoskeletal system: Secondary | ICD-10-CM

## 2016-01-05 DIAGNOSIS — M25622 Stiffness of left elbow, not elsewhere classified: Secondary | ICD-10-CM | POA: Diagnosis not present

## 2016-01-05 DIAGNOSIS — M67922 Unspecified disorder of synovium and tendon, left upper arm: Secondary | ICD-10-CM | POA: Diagnosis not present

## 2016-01-05 DIAGNOSIS — M25512 Pain in left shoulder: Secondary | ICD-10-CM | POA: Diagnosis not present

## 2016-01-05 NOTE — Therapy (Signed)
[place], [place], [address] Phone: [phone]   Fax:  [phone]  Occupational Therapy Treatment And reassessment Patient Details  Name: [name] MRN: [id number] Date of Birth: February 05, 1956 Referring Provider: Dr. Joni Fears  Encounter Date: 01/05/2016      OT End of Session - 01/05/16 1609    Visit Number 7   Number of Visits 24   Date for OT Re-Evaluation 02/07/16   Authorization Type UMR   OT Start Time 1520  mini reassess   OT Stop Time 1600   OT Time Calculation (min) 40 min   Activity Tolerance Patient tolerated treatment well   Behavior During Therapy [facility] for tasks assessed/performed      Past Medical History  Diagnosis Date  . Arteriosclerotic cardiovascular disease (ASCVD) 2003    Non-Q MI with stent to RCA in 12/2001, normal EF, additional stents to mid CX and D1; cath in 02/2005-progressive or eye disease, 50-70% mid LAD, 90% distal LAD, 50% D1, 60-70% CX, small PDA with diffuse disease, patent RCA stent; repeat cath in 01/2008-no significant progression; non-Q MI in 02/2010 with 100% mid Cx at  prior stent, nondom. RCA with patent stent, 50% RI, nl EF, CABG-04/2010  . Gastroesophageal reflux disease   . Hyperlipidemia     Predominantly elevated triglycerides  . Hypertension   . Anxiety   . Erectile dysfunction   . Chronic obstructive pulmonary disease ([place])   . Chronic kidney disease     Creatinine of 1.44 in 04/2009  . Tobacco abuse, in remission     35 pack years; discontinued 05/2010  . Abnormal LFTs   . Pneumonia 2006    2006  . Adenomatous colon polyp 2012  . Internal hemorrhoids   . Hepatic steatosis   . Full dentures   . Wears glasses   . Allergy     to Red Meat  . Myocardial infarction ([place]) 2002, 2011  . Coronary artery disease     stent 2011  . Diabetes mellitus     Past Surgical History  Procedure Laterality Date  . Appendectomy    . Colonoscopy w/  polypectomy  2012    Dr. Olevia Perches; 2 polyps removed one of which was precancerous  . Hemorrhoid surgery N/A 02/24/2014    Procedure: HEMORRHOIDECTOMY;  Surgeon: Joyice Faster. Cornett, MD;  Location: [place];  Service: General;  Laterality: N/A;  . Coronary artery bypass graft  05/2010    4 vessels August 2011  . Cataract extraction w/phaco Left 09/21/2014    Procedure: CATARACT EXTRACTION PHACO AND INTRAOCULAR LENS PLACEMENT (IOC);  Surgeon: Tonny Branch, MD;  Location: [facility];  Service: Ophthalmology;  Laterality: Left;  CDE:9.77  . Eye surgery    . Shoulder arthroscopy with open rotator cuff repair and distal clavicle acrominectomy Left 11/23/2015    Procedure: SHOULDER ARTHROSCOPY WITH MINI-OPEN ROTATOR CUFF REPAIR, DISTAL CLAVICLE RESECTION AND SUBACROMIAL DECOMPRESSION.;  Surgeon: Garald Balding, MD;  Location: [place];  Service: Orthopedics;  Laterality: Left;  LEFT SHOULDER ARTHROSCOPIC SUBACROMIAL DECOMPRESSION, DISTAL CLAVICLE RESECTION, MINI-OPEN ROTATOR CUFF REPAIR.    There were no vitals filed for this visit.  Visit Diagnosis:  Shoulder joint stiffness, left  Shoulder weakness      Subjective Assessment - 01/05/16 1526    Subjective  It only hurts when I use it.   Currently in Pain? No/denies            [facility] OT Assessment -  01/05/16 1529    Assessment   Diagnosis S/P Left Rotator Cuff Repair and Biceps Tenodesis   Precautions   Precautions Shoulder   Type of Shoulder Precautions AA/ROM until 01/27/16 unless MD states otherwise.    ROM / Strength   AROM / PROM / Strength AROM;PROM;Strength   AROM   Overall AROM Comments Assessed supine. Shoulder adducted for IR/er. A/ROM note assessed on eval.   AROM Assessment Site Shoulder   Right/Left Shoulder Left   Left Shoulder Flexion 145 Degrees   Left Shoulder ABduction 180 Degrees   Left Shoulder Internal Rotation 90 Degrees   Left Shoulder External Rotation 70 Degrees   PROM   Overall PROM Comments P/ROM  assessed in supine, External rotation and internal rotation with shoulder adducted.   PROM Assessment Site Shoulder;Elbow   Right/Left Shoulder Left   Left Shoulder Flexion 165 Degrees  previous 80   Left Shoulder ABduction 180 Degrees  previous 65   Left Shoulder Internal Rotation 90 Degrees  previous 80   Left Shoulder External Rotation 73 Degrees  Previous 5   Strength   Overall Strength Unable to assess;Due to precautions   Strength Assessment Site Shoulder   Right/Left Shoulder Left                  OT Treatments/Exercises (OP) - 01/05/16 1524    Exercises   Exercises Shoulder   Shoulder Exercises: Supine   Protraction PROM;5 reps;AAROM;12 reps   Horizontal ABduction PROM;5 reps;AAROM;12 reps   External Rotation PROM;5 reps;AAROM;12 reps   Internal Rotation PROM;5 reps;AAROM;12 reps   Flexion PROM;5 reps;AAROM;12 reps   ABduction PROM;5 reps;AAROM;12 reps   Shoulder Exercises: Standing   Protraction AAROM;10 reps   Horizontal ABduction AAROM;10 reps   External Rotation AAROM;10 reps   Internal Rotation AAROM;10 reps   Flexion AAROM;10 reps   ABduction AAROM;10 reps   Shoulder Exercises: Pulleys   Flexion 1 minute   ABduction 1 minute   Shoulder Exercises: ROM/Strengthening   Wall Wash 1'   Thumb Tacks 1'   Proximal Shoulder Strengthening, Supine 10X   Proximal Shoulder Strengthening, Seated 10X  Standing   Manual Therapy   Manual Therapy Myofascial release   Manual therapy comments manual therapy completed seperately from all other interventions this date   Myofascial Release myofascial release and manual stretching to left upper arm, biceps region, scapular region and shoulder region and associated areas to decrease pain and restrictions and improve pain free mobility in his left shoulder region.                   OT Short Term Goals - 01/05/16 1538    OT SHORT TERM GOAL #1   Title Patient will be educated on a HEP for left shoulder and  elbow mobility needed to return to PLOF with daily activities.    Time 6   Period Weeks   Status Achieved   OT SHORT TERM GOAL #2   Title Patient will improve P/ROM of left shoulder to [facility] in order to improve independence with tying his shoes.    Time 6   Period Weeks   Status Achieved   OT SHORT TERM GOAL #3   Title Patietn will demonstrate 3+/5 strength in his left shoulder and elbow for improved ability to lift equipment at work.    Time 6   Period Weeks   Status On-going   OT SHORT TERM GOAL #4   Title Patient will decrease pain in his  left shoulder to 3/10 or better when lying on his back at night.    Time 6   Period Weeks   Status Achieved   OT SHORT TERM GOAL #5   Title Patient will demonstrate min-mod fascial restrictions in his left shoulder region for greater mobility needed to use left shoulder as an active assist with daily tasks.    Time 6   Period Weeks   Status Achieved           OT Long Term Goals - 12/14/15 1632    OT LONG TERM GOAL #1   Title Patient will use his left arm 100% with all daily, work, and leisure activities at prior level of function.   Time 12   Period Weeks   Status On-going   OT LONG TERM GOAL #2   Title Patient will have WNL A/ROM in his left shoulder and elbow in order to be able to tuck his shirt in and reach overhead to complete necessary work activities.    Time 12   Period Weeks   Status On-going   OT LONG TERM GOAL #3   Title Patient will have 5/5 strength in his left shoulder and elbow in order to pick up ladders an other equipment at work.    Time 12   Period Weeks   Status On-going   OT LONG TERM GOAL #4   Title Patient will have 1/10 pain or less in his left shoulder when lying on his back or reaching overhead.    Time 12   Period Weeks   Status On-going   OT LONG TERM GOAL #5   Title Patient will demonstrate trace fascial restrictions in his left shoulder and elbow region in order to have the necessary pain free range  of motion needed for ADL completion.    Time 12   Period Weeks   Status On-going               Plan - 01/05/16 1611    Clinical Impression Statement A: Mini reassessment completed as patient has a follow up appointment with Dr. Durward Fortes next week. patient has shown great improvement with all ROM measurements this session. Unable to assess strength due to precautions. Patient has met 4/5 STGs and is progressing towards long term goals. Patient reports that he is able to actively reach for items at shoulder level  or below and he overall doing more with his arm as he is allowed.    Plan P: Continue with AA/ROM exercises. Increase standing repeitions.         Problem List Patient Active Problem List   Diagnosis Date Noted  . Partial tear of left rotator cuff 11/23/2015  . Biceps tendon tear 11/23/2015  . Impingement syndrome of left shoulder 11/23/2015  . Post-operative state 03/13/2014  . Hemorrhoids 01/19/2014  . Arteriosclerotic cardiovascular disease (ASCVD)   . Diabetes mellitus, type II ([place])   . Gastroesophageal reflux disease   . Hyperlipidemia   . Hypertension   . Chronic kidney disease   . Tobacco abuse, in remission   . ERECTILE DYSFUNCTION, NON-ORGANIC 10/06/2009    Ailene Ravel, OTR/L,CBIS  [phone]  01/05/2016, 4:20 PM  [place], [place], [address] Phone: [phone]   Fax:  [phone]  Name: [name] MRN: [id number] Date of Birth: 10-Apr-1956

## 2016-01-10 ENCOUNTER — Ambulatory Visit (HOSPITAL_COMMUNITY): Payer: 59 | Attending: Orthopaedic Surgery

## 2016-01-10 ENCOUNTER — Encounter (HOSPITAL_COMMUNITY): Payer: Self-pay

## 2016-01-10 DIAGNOSIS — M25612 Stiffness of left shoulder, not elsewhere classified: Secondary | ICD-10-CM | POA: Diagnosis not present

## 2016-01-10 DIAGNOSIS — R29898 Other symptoms and signs involving the musculoskeletal system: Secondary | ICD-10-CM | POA: Insufficient documentation

## 2016-01-10 DIAGNOSIS — M6281 Muscle weakness (generalized): Secondary | ICD-10-CM | POA: Insufficient documentation

## 2016-01-10 DIAGNOSIS — Z9889 Other specified postprocedural states: Secondary | ICD-10-CM | POA: Insufficient documentation

## 2016-01-10 DIAGNOSIS — M25512 Pain in left shoulder: Secondary | ICD-10-CM | POA: Diagnosis not present

## 2016-01-10 DIAGNOSIS — M25511 Pain in right shoulder: Secondary | ICD-10-CM | POA: Diagnosis not present

## 2016-01-10 NOTE — Therapy (Signed)
[place], [place], [address] Phone: [phone]   Fax:  [phone]  Occupational Therapy Treatment  Patient Details  Name: [name] MRN: [id number] Date of Birth: [birth year] Referring Provider: Dr. Joni Fears  Encounter Date: 01/10/2016      OT End of Session - 01/10/16 1559    Visit Number 8   Number of Visits 24   Date for OT Re-Evaluation 02/07/16   Authorization Type UMR   OT Start Time 1515   OT Stop Time 1555   OT Time Calculation (min) 40 min   Activity Tolerance Patient tolerated treatment well   Behavior During Therapy [facility] for tasks assessed/performed      Past Medical History  Diagnosis Date  . Arteriosclerotic cardiovascular disease (ASCVD) 2003    Non-Q MI with stent to RCA in 12/2001, normal EF, additional stents to mid CX and D1; cath in 02/2005-progressive or eye disease, 50-70% mid LAD, 90% distal LAD, 50% D1, 60-70% CX, small PDA with diffuse disease, patent RCA stent; repeat cath in 01/2008-no significant progression; non-Q MI in 02/2010 with 100% mid Cx at  prior stent, nondom. RCA with patent stent, 50% RI, nl EF, CABG-04/2010  . Gastroesophageal reflux disease   . Hyperlipidemia     Predominantly elevated triglycerides  . Hypertension   . Anxiety   . Erectile dysfunction   . Chronic obstructive pulmonary disease ([place])   . Chronic kidney disease     Creatinine of 1.44 in 04/2009  . Tobacco abuse, in remission     35 pack years; discontinued 05/2010  . Abnormal LFTs   . Pneumonia 2006    2006  . Adenomatous colon polyp 2012  . Internal hemorrhoids   . Hepatic steatosis   . Full dentures   . Wears glasses   . Allergy     to Red Meat  . Myocardial infarction ([place]) 2002, 2011  . Coronary artery disease     stent 2011  . Diabetes mellitus     Past Surgical History  Procedure Laterality Date  . Appendectomy    . Colonoscopy w/ polypectomy  2012    Dr. Olevia Perches; 2 polyps  removed one of which was precancerous  . Hemorrhoid surgery N/A 02/24/2014    Procedure: HEMORRHOIDECTOMY;  Surgeon: Joyice Faster. Cornett, MD;  Location: [place];  Service: General;  Laterality: N/A;  . Coronary artery bypass graft  05/2010    4 vessels August 2011  . Cataract extraction w/phaco Left 09/21/2014    Procedure: CATARACT EXTRACTION PHACO AND INTRAOCULAR LENS PLACEMENT (IOC);  Surgeon: Tonny Branch, MD;  Location: [facility];  Service: Ophthalmology;  Laterality: Left;  CDE:9.77  . Eye surgery    . Shoulder arthroscopy with open rotator cuff repair and distal clavicle acrominectomy Left 11/23/2015    Procedure: SHOULDER ARTHROSCOPY WITH MINI-OPEN ROTATOR CUFF REPAIR, DISTAL CLAVICLE RESECTION AND SUBACROMIAL DECOMPRESSION.;  Surgeon: Garald Balding, MD;  Location: [place];  Service: Orthopedics;  Laterality: Left;  LEFT SHOULDER ARTHROSCOPIC SUBACROMIAL DECOMPRESSION, DISTAL CLAVICLE RESECTION, MINI-OPEN ROTATOR CUFF REPAIR.    There were no vitals filed for this visit.  Visit Diagnosis:  Shoulder joint stiffness, left  Shoulder weakness  Shoulder pain, acute, left  S/P rotator cuff repair      Subjective Assessment - 01/10/16 1518    Subjective  S: No pain, just being old.   Currently in Pain? No/denies  [facility] OT Assessment - 01/10/16 1520    Assessment   Diagnosis S/P Left Rotator Cuff Repair and Biceps Tenodesis   Precautions   Precautions Shoulder   Type of Shoulder Precautions AA/ROM until 01/27/16 unless MD states otherwise.                   OT Treatments/Exercises (OP) - 01/10/16 1521    Exercises   Exercises Shoulder   Shoulder Exercises: Supine   Protraction PROM;5 reps;AAROM;12 reps   Horizontal ABduction PROM;5 reps;AAROM;12 reps   External Rotation PROM;5 reps;AAROM;12 reps   Internal Rotation PROM;5 reps;AAROM;12 reps   Flexion PROM;5 reps;AAROM;12 reps   ABduction PROM;5 reps;AAROM;12 reps   Shoulder Exercises:  Seated   Extension Theraband;10 reps   Theraband Level (Shoulder Extension) Level 2 (Red)   Row Theraband;10 reps   Theraband Level (Shoulder Row) Level 2 (Red)   Shoulder Exercises: Standing   Protraction AAROM;12 reps   Horizontal ABduction AAROM;12 reps   External Rotation AAROM;12 reps   Internal Rotation AAROM;12 reps   Flexion AAROM;12 reps   ABduction AAROM;12 reps   Shoulder Exercises: Pulleys   Flexion 2 minutes   ABduction 2 minutes   Shoulder Exercises: ROM/Strengthening   Wall Wash 1'   Thumb Tacks 1'   Proximal Shoulder Strengthening, Supine 10X   Proximal Shoulder Strengthening, Seated 10X  standing   Manual Therapy   Manual Therapy Myofascial release   Manual therapy comments manual therapy completed seperately from all other interventions this date   Myofascial Release myofascial release and manual stretching to left upper arm, biceps region, scapular region and shoulder region and associated areas to decrease pain and restrictions and improve pain free mobility in his left shoulder region.                   OT Short Term Goals - 01/10/16 1603    OT SHORT TERM GOAL #1   Title Patient will be educated on a HEP for left shoulder and elbow mobility needed to return to PLOF with daily activities.    Time 6   Period Weeks   OT SHORT TERM GOAL #2   Title Patient will improve P/ROM of left shoulder to [facility] in order to improve independence with tying his shoes.    Time 6   Period Weeks   OT SHORT TERM GOAL #3   Title Patietn will demonstrate 3+/5 strength in his left shoulder and elbow for improved ability to lift equipment at work.    Time 6   Period Weeks   Status On-going   OT SHORT TERM GOAL #4   Title Patient will decrease pain in his left shoulder to 3/10 or better when lying on his back at night.    Time 6   Period Weeks   OT SHORT TERM GOAL #5   Title Patient will demonstrate min-mod fascial restrictions in his left shoulder region for greater  mobility needed to use left shoulder as an active assist with daily tasks.    Time 6   Period Weeks           OT Long Term Goals - 12/14/15 1632    OT LONG TERM GOAL #1   Title Patient will use his left arm 100% with all daily, work, and leisure activities at prior level of function.   Time 12   Period Weeks   Status On-going   OT LONG TERM GOAL #2   Title Patient will have WNL  A/ROM in his left shoulder and elbow in order to be able to tuck his shirt in and reach overhead to complete necessary work activities.    Time 12   Period Weeks   Status On-going   OT LONG TERM GOAL #3   Title Patient will have 5/5 strength in his left shoulder and elbow in order to pick up ladders an other equipment at work.    Time 12   Period Weeks   Status On-going   OT LONG TERM GOAL #4   Title Patient will have 1/10 pain or less in his left shoulder when lying on his back or reaching overhead.    Time 12   Period Weeks   Status On-going   OT LONG TERM GOAL #5   Title Patient will demonstrate trace fascial restrictions in his left shoulder and elbow region in order to have the necessary pain free range of motion needed for ADL completion.    Time 12   Period Weeks   Status On-going               Plan - 01/10/16 1559    Clinical Impression Statement A: Added extension and row with Theraband this session, therapist provides visual, verbal, and tactile cues for form.  Increased reps for AA/ROM exercises in standing to 12. Increased time on pulleys to 2 minutes for both flexion and abduction. Patient reports no pain after exercises.   Plan P: Continue with AA/ROM exercises including ones added today to increase shoulder strength, ROM, and stability for functional daily tasks.        Problem List Patient Active Problem List   Diagnosis Date Noted  . Partial tear of left rotator cuff 11/23/2015  . Biceps tendon tear 11/23/2015  . Impingement syndrome of left shoulder 11/23/2015  .  Post-operative state 03/13/2014  . Hemorrhoids 01/19/2014  . Arteriosclerotic cardiovascular disease (ASCVD)   . Diabetes mellitus, type II ([place])   . Gastroesophageal reflux disease   . Hyperlipidemia   . Hypertension   . Chronic kidney disease   . Tobacco abuse, in remission   . ERECTILE DYSFUNCTION, NON-ORGANIC 10/06/2009    Marijo Conception, OTA student [phone]   01/10/2016, 4:04 PM  [place] [address] [place], [place], [address] Phone: [phone]   Fax:  [phone]  Name: AVEDIS CALLO MRN: [id number] Date of Birth: [birth year]  Ailene Ravel, OTR/L,CBIS  [phone]   Note reviewed by clinical instructor and accurately reflects treatment session.

## 2016-01-13 ENCOUNTER — Ambulatory Visit (HOSPITAL_COMMUNITY): Payer: 59

## 2016-01-13 DIAGNOSIS — M25511 Pain in right shoulder: Secondary | ICD-10-CM

## 2016-01-13 DIAGNOSIS — R29898 Other symptoms and signs involving the musculoskeletal system: Secondary | ICD-10-CM | POA: Diagnosis not present

## 2016-01-13 DIAGNOSIS — M6281 Muscle weakness (generalized): Secondary | ICD-10-CM

## 2016-01-13 DIAGNOSIS — M25612 Stiffness of left shoulder, not elsewhere classified: Secondary | ICD-10-CM

## 2016-01-13 DIAGNOSIS — Z9889 Other specified postprocedural states: Secondary | ICD-10-CM | POA: Diagnosis not present

## 2016-01-13 DIAGNOSIS — M25512 Pain in left shoulder: Secondary | ICD-10-CM | POA: Diagnosis not present

## 2016-01-13 NOTE — Therapy (Signed)
[place], [place], [address] Phone: [phone]   Fax:  [phone]  Occupational Therapy Treatment  Patient Details  Name: [name] MRN: [id number] Date of Birth: 02-07-1956 Referring Provider: Dr. Joni Fears  Encounter Date: 01/13/2016      OT End of Session - 01/13/16 1647    Visit Number 9   Number of Visits 24   Date for OT Re-Evaluation 02/07/16   Authorization Type UMR   OT Start Time 1605   OT Stop Time 1645   OT Time Calculation (min) 40 min   Activity Tolerance Patient tolerated treatment well   Behavior During Therapy [facility] for tasks assessed/performed      Past Medical History  Diagnosis Date  . Arteriosclerotic cardiovascular disease (ASCVD) 2003    Non-Q MI with stent to RCA in 12/2001, normal EF, additional stents to mid CX and D1; cath in 02/2005-progressive or eye disease, 50-70% mid LAD, 90% distal LAD, 50% D1, 60-70% CX, small PDA with diffuse disease, patent RCA stent; repeat cath in 01/2008-no significant progression; non-Q MI in 02/2010 with 100% mid Cx at  prior stent, nondom. RCA with patent stent, 50% RI, nl EF, CABG-04/2010  . Gastroesophageal reflux disease   . Hyperlipidemia     Predominantly elevated triglycerides  . Hypertension   . Anxiety   . Erectile dysfunction   . Chronic obstructive pulmonary disease ([place])   . Chronic kidney disease     Creatinine of 1.44 in 04/2009  . Tobacco abuse, in remission     35 pack years; discontinued 05/2010  . Abnormal LFTs   . Pneumonia 2006    2006  . Adenomatous colon polyp 2012  . Internal hemorrhoids   . Hepatic steatosis   . Full dentures   . Wears glasses   . Allergy     to Red Meat  . Myocardial infarction ([place]) 2002, 2011  . Coronary artery disease     stent 2011  . Diabetes mellitus     Past Surgical History  Procedure Laterality Date  . Appendectomy    . Colonoscopy w/ polypectomy  2012    Dr. Olevia Perches; 2 polyps  removed one of which was precancerous  . Hemorrhoid surgery N/A 02/24/2014    Procedure: HEMORRHOIDECTOMY;  Surgeon: Joyice Faster. Cornett, MD;  Location: [place];  Service: General;  Laterality: N/A;  . Coronary artery bypass graft  05/2010    4 vessels August 2011  . Cataract extraction w/phaco Left 09/21/2014    Procedure: CATARACT EXTRACTION PHACO AND INTRAOCULAR LENS PLACEMENT (IOC);  Surgeon: Tonny Branch, MD;  Location: [facility];  Service: Ophthalmology;  Laterality: Left;  CDE:9.77  . Eye surgery    . Shoulder arthroscopy with open rotator cuff repair and distal clavicle acrominectomy Left 11/23/2015    Procedure: SHOULDER ARTHROSCOPY WITH MINI-OPEN ROTATOR CUFF REPAIR, DISTAL CLAVICLE RESECTION AND SUBACROMIAL DECOMPRESSION.;  Surgeon: Garald Balding, MD;  Location: [place];  Service: Orthopedics;  Laterality: Left;  LEFT SHOULDER ARTHROSCOPIC SUBACROMIAL DECOMPRESSION, DISTAL CLAVICLE RESECTION, MINI-OPEN ROTATOR CUFF REPAIR.    There were no vitals filed for this visit.  Visit Diagnosis:  Shoulder joint stiffness, left  Pain in right shoulder  Muscle weakness (generalized)      Subjective Assessment - 01/13/16 1602    Subjective  S: Just that one little spot.   Currently in Pain? No/denies            [facility] OT  Assessment - 01/13/16 1604    Assessment   Diagnosis S/P Left Rotator Cuff Repair and Biceps Tenodesis   Precautions   Precautions Shoulder   Type of Shoulder Precautions AA/ROM until 01/27/16 unless MD states otherwise.                   OT Treatments/Exercises (OP) - 01/13/16 1604    Exercises   Exercises Shoulder   Shoulder Exercises: Supine   Protraction PROM;5 reps;AAROM;12 reps   Horizontal ABduction PROM;5 reps;AAROM;12 reps   External Rotation PROM;5 reps;AAROM;12 reps   Internal Rotation PROM;5 reps;AAROM;12 reps   Flexion PROM;5 reps;AAROM;12 reps   ABduction PROM;5 reps;AAROM;12 reps   Shoulder Exercises: Seated    Extension Theraband;12 reps   Theraband Level (Shoulder Extension) Level 2 (Red)   Row Theraband;12 reps   Theraband Level (Shoulder Row) Level 2 (Red)   Shoulder Exercises: Standing   Protraction AAROM;12 reps   Horizontal ABduction AAROM;12 reps   External Rotation AAROM;12 reps   Internal Rotation AAROM;12 reps   Flexion AAROM;12 reps   ABduction AAROM;12 reps   Shoulder Exercises: Pulleys   Flexion 2 minutes   ABduction 2 minutes   Shoulder Exercises: ROM/Strengthening   Wall Wash 2'   Thumb Tacks 1'   Proximal Shoulder Strengthening, Seated 10X  standing   Manual Therapy   Manual Therapy Myofascial release   Manual therapy comments manual therapy completed seperately from all other interventions this date   Myofascial Release myofascial release and manual stretching to left upper arm, biceps region, scapular region and shoulder region and associated areas to decrease pain and restrictions and improve pain free mobility in his left shoulder region.                 OT Education - 01/13/16 1643    Education provided Yes   Education Details Demonstrated how to relieve fascial restrictions using tennis ball.   Person(s) Educated Patient   Methods Explanation;Demonstration   Comprehension Verbalized understanding          OT Short Term Goals - 01/10/16 1603    OT SHORT TERM GOAL #1   Title Patient will be educated on a HEP for left shoulder and elbow mobility needed to return to PLOF with daily activities.    Time 6   Period Weeks   OT SHORT TERM GOAL #2   Title Patient will improve P/ROM of left shoulder to [facility] in order to improve independence with tying his shoes.    Time 6   Period Weeks   OT SHORT TERM GOAL #3   Title Patietn will demonstrate 3+/5 strength in his left shoulder and elbow for improved ability to lift equipment at work.    Time 6   Period Weeks   Status On-going   OT SHORT TERM GOAL #4   Title Patient will decrease pain in his left shoulder  to 3/10 or better when lying on his back at night.    Time 6   Period Weeks   OT SHORT TERM GOAL #5   Title Patient will demonstrate min-mod fascial restrictions in his left shoulder region for greater mobility needed to use left shoulder as an active assist with daily tasks.    Time 6   Period Weeks           OT Long Term Goals - 12/14/15 1632    OT LONG TERM GOAL #1   Title Patient will use his left arm 100% with all  daily, work, and leisure activities at prior level of function.   Time 12   Period Weeks   Status On-going   OT LONG TERM GOAL #2   Title Patient will have WNL A/ROM in his left shoulder and elbow in order to be able to tuck his shirt in and reach overhead to complete necessary work activities.    Time 12   Period Weeks   Status On-going   OT LONG TERM GOAL #3   Title Patient will have 5/5 strength in his left shoulder and elbow in order to pick up ladders an other equipment at work.    Time 12   Period Weeks   Status On-going   OT LONG TERM GOAL #4   Title Patient will have 1/10 pain or less in his left shoulder when lying on his back or reaching overhead.    Time 12   Period Weeks   Status On-going   OT LONG TERM GOAL #5   Title Patient will demonstrate trace fascial restrictions in his left shoulder and elbow region in order to have the necessary pain free range of motion needed for ADL completion.    Time 12   Period Weeks   Status On-going               Plan - 01/13/16 1643    Clinical Impression Statement A: Continued with AA/ROM exercises. Increased reps of Theraband to 12, increased wall wash to 2 minutes. Therapist notes decreased fascial restrictions and increased P/ROM this session.   Plan P: Continue with AA/ROM exercises to increase shoulder strength, ROM, and stability for functional daily tasks. Take measurements for patient's upcoming doctor's apointment.        Problem List Patient Active Problem List   Diagnosis Date Noted   . Partial tear of left rotator cuff 11/23/2015  . Biceps tendon tear 11/23/2015  . Impingement syndrome of left shoulder 11/23/2015  . Post-operative state 03/13/2014  . Hemorrhoids 01/19/2014  . Arteriosclerotic cardiovascular disease (ASCVD)   . Diabetes mellitus, type II ([place])   . Gastroesophageal reflux disease   . Hyperlipidemia   . Hypertension   . Chronic kidney disease   . Tobacco abuse, in remission   . ERECTILE DYSFUNCTION, NON-ORGANIC 10/06/2009    Marijo Conception OTA student 01/13/2016, 4:48 PM  [place] [address] [place], [place], [address] Phone: [phone]   Fax:  [phone]  Name: ARIYON BRUNEY MRN: [id number] Date of Birth: 14-Jan-1956  Ailene Ravel, OTR/L,CBIS  [phone]  Note reviewed by clinical instructor and accurately reflects treatment session.

## 2016-01-17 ENCOUNTER — Ambulatory Visit (HOSPITAL_COMMUNITY): Payer: 59

## 2016-01-17 DIAGNOSIS — M25612 Stiffness of left shoulder, not elsewhere classified: Secondary | ICD-10-CM | POA: Diagnosis not present

## 2016-01-17 DIAGNOSIS — M6281 Muscle weakness (generalized): Secondary | ICD-10-CM | POA: Diagnosis not present

## 2016-01-17 DIAGNOSIS — M25512 Pain in left shoulder: Secondary | ICD-10-CM

## 2016-01-17 DIAGNOSIS — R29898 Other symptoms and signs involving the musculoskeletal system: Secondary | ICD-10-CM | POA: Diagnosis not present

## 2016-01-17 DIAGNOSIS — M25511 Pain in right shoulder: Secondary | ICD-10-CM | POA: Diagnosis not present

## 2016-01-17 DIAGNOSIS — Z9889 Other specified postprocedural states: Secondary | ICD-10-CM | POA: Diagnosis not present

## 2016-01-17 NOTE — Therapy (Signed)
[name], [place], [address] Phone: [phone]   Fax:  [phone]  Occupational Therapy Treatment  Patient Details  Name: [name] MRN: [id number] Date of Birth: 1956/07/22 Referring Provider: Dr. Joni Fears  Encounter Date: 01/17/2016      OT End of Session - 01/17/16 1613    Visit Number 10   Number of Visits 24   Date for OT Re-Evaluation 02/07/16   Authorization Type UMR   OT Start Time 1520   OT Stop Time 1600   OT Time Calculation (min) 40 min   Activity Tolerance Patient tolerated treatment well   Behavior During Therapy [facility] for tasks assessed/performed      Past Medical History  Diagnosis Date  . Arteriosclerotic cardiovascular disease (ASCVD) 2003    Non-Q MI with stent to RCA in 12/2001, normal EF, additional stents to mid CX and D1; cath in 02/2005-progressive or eye disease, 50-70% mid LAD, 90% distal LAD, 50% D1, 60-70% CX, small PDA with diffuse disease, patent RCA stent; repeat cath in 01/2008-no significant progression; non-Q MI in 02/2010 with 100% mid Cx at  prior stent, nondom. RCA with patent stent, 50% RI, nl EF, CABG-04/2010  . Gastroesophageal reflux disease   . Hyperlipidemia     Predominantly elevated triglycerides  . Hypertension   . Anxiety   . Erectile dysfunction   . Chronic obstructive pulmonary disease ([place])   . Chronic kidney disease     Creatinine of 1.44 in 04/2009  . Tobacco abuse, in remission     35 pack years; discontinued 05/2010  . Abnormal LFTs   . Pneumonia 2006    2006  . Adenomatous colon polyp 2012  . Internal hemorrhoids   . Hepatic steatosis   . Full dentures   . Wears glasses   . Allergy     to Red Meat  . Myocardial infarction ([place]) 2002, 2011  . Coronary artery disease     stent 2011  . Diabetes mellitus     Past Surgical History  Procedure Laterality Date  . Appendectomy    . Colonoscopy w/ polypectomy  2012    Dr. Olevia Perches; 2  polyps removed one of which was precancerous  . Hemorrhoid surgery N/A 02/24/2014    Procedure: HEMORRHOIDECTOMY;  Surgeon: Joyice Faster. Cornett, MD;  Location: [place];  Service: General;  Laterality: N/A;  . Coronary artery bypass graft  05/2010    4 vessels August 2011  . Cataract extraction w/phaco Left 09/21/2014    Procedure: CATARACT EXTRACTION PHACO AND INTRAOCULAR LENS PLACEMENT (IOC);  Surgeon: Tonny Branch, MD;  Location[facility];  Service: Ophthalmology;  Laterality: Left;  CDE:9.77  . Eye surgery    . Shoulder arthroscopy with open rotator cuff repair and distal clavicle acrominectomy Left 11/23/2015    Procedure: SHOULDER ARTHROSCOPY WITH MINI-OPEN ROTATOR CUFF REPAIR, DISTAL CLAVICLE RESECTION AND SUBACROMIAL DECOMPRESSION.;  Surgeon: Garald Balding, MD;  Location: [place];  Service: Orthopedics;  Laterality: Left;  LEFT SHOULDER ARTHROSCOPIC SUBACROMIAL DECOMPRESSION, DISTAL CLAVICLE RESECTION, MINI-OPEN ROTATOR CUFF REPAIR.    There were no vitals filed for this visit.      Subjective Assessment - 01/17/16 1547    Subjective  S: I hope I get taken off of restrictions when I see the MD wednesday   Currently in Pain? Yes   Pain Score 3    Pain Location Shoulder   Pain Orientation Left   Pain Descriptors /  Indicators Aching   Pain Type Acute pain   Pain Radiating Towards N/A   Pain Onset 1 to 4 weeks ago   Pain Frequency Intermittent   Aggravating Factors  Reaching out forward   Pain Relieving Factors rest   Effect of Pain on Daily Activities decreased independence with functional activities.    Multiple Pain Sites No            [facility] OT Assessment - 01/17/16 1536    Assessment   Diagnosis S/P Left Rotator Cuff Repair and Biceps Tenodesis   Precautions   Precautions Shoulder   Type of Shoulder Precautions A/ROM and then progress as tolerated.    AROM   Overall AROM Comments Assessed supine then seated. Seated not assessed prior to today.. Shoulder  adducted for IR/er. A/ROM note assessed on eval.   AROM Assessment Site Shoulder   Right/Left Shoulder Left   Left Shoulder Flexion 180 Degrees  previous: 145 Seated: 157   Left Shoulder ABduction 180 Degrees  previous: 180 Seated: 175   Left Shoulder Internal Rotation 90 Degrees  previous: 90 Seated: 90   Left Shoulder External Rotation 90 Degrees  previous: 70 seated: 90   PROM   Left Shoulder Flexion 180 Degrees  previous: 165   Left Shoulder ABduction 180 Degrees  previous: 180   Left Shoulder Internal Rotation 90 Degrees  previous: 90   Left Shoulder External Rotation 90 Degrees  previous: 73   Strength   Overall Strength Comments Assessed seated. IR/er adducted.   Strength Assessment Site Shoulder   Right/Left Shoulder Left   Left Shoulder Flexion 3/5   Left Shoulder ABduction 3/5   Left Shoulder Internal Rotation 3/5   Left Shoulder External Rotation 3/5                  OT Treatments/Exercises (OP) - 01/17/16 1549    Exercises   Exercises Shoulder   Shoulder Exercises: Supine   Protraction PROM;5 reps   Horizontal ABduction PROM;5 reps   External Rotation PROM;5 reps   Internal Rotation PROM;5 reps   Flexion PROM;5 reps   ABduction PROM;5 reps   Shoulder Exercises: Standing   Protraction AROM;12 reps   Horizontal ABduction AROM;12 reps   External Rotation AROM;12 reps   Internal Rotation AROM;12 reps   Flexion AROM;12 reps   ABduction AROM;12 reps   Shoulder Exercises: ROM/Strengthening   UBE (Upper Arm Bike) Level 1 3' forward 3' reverse   "W" Arms 12X   X to V Arms 12X   Proximal Shoulder Strengthening, Seated 12X   Ball on Wall 1' flexion 1' abduction green ball                  OT Short Term Goals - 01/10/16 1603    OT SHORT TERM GOAL #1   Title Patient will be educated on a HEP for left shoulder and elbow mobility needed to return to PLOF with daily activities.    Time 6   Period Weeks   OT SHORT TERM GOAL #2   Title  Patient will improve P/ROM of left shoulder to [facility] in order to improve independence with tying his shoes.    Time 6   Period Weeks   OT SHORT TERM GOAL #3   Title Patietn will demonstrate 3+/5 strength in his left shoulder and elbow for improved ability to lift equipment at work.    Time 6   Period Weeks   Status On-going   OT SHORT  TERM GOAL #4   Title Patient will decrease pain in his left shoulder to 3/10 or better when lying on his back at night.    Time 6   Period Weeks   OT SHORT TERM GOAL #5   Title Patient will demonstrate min-mod fascial restrictions in his left shoulder region for greater mobility needed to use left shoulder as an active assist with daily tasks.    Time 6   Period Weeks           OT Long Term Goals - 12/14/15 1632    OT LONG TERM GOAL #1   Title Patient will use his left arm 100% with all daily, work, and leisure activities at prior level of function.   Time 12   Period Weeks   Status On-going   OT LONG TERM GOAL #2   Title Patient will have WNL A/ROM in his left shoulder and elbow in order to be able to tuck his shirt in and reach overhead to complete necessary work activities.    Time 12   Period Weeks   Status On-going   OT LONG TERM GOAL #3   Title Patient will have 5/5 strength in his left shoulder and elbow in order to pick up ladders an other equipment at work.    Time 12   Period Weeks   Status On-going   OT LONG TERM GOAL #4   Title Patient will have 1/10 pain or less in his left shoulder when lying on his back or reaching overhead.    Time 12   Period Weeks   Status On-going   OT LONG TERM GOAL #5   Title Patient will demonstrate trace fascial restrictions in his left shoulder and elbow region in order to have the necessary pain free range of motion needed for ADL completion.    Time 12   Period Weeks   Status On-going               Plan - 01/17/16 1613    Clinical Impression Statement A: Measurements taken this date for  MD appointment on Wednesday. Patient progressed to A/ROM this date with min VC for for and technique. Patient is making great progress with therapy and steadily progressing towards therapy goals.   Plan P: Follow up on MD appointment. Update HEP. Add overhead lacing to work on shoulder stabilization.       Patient will benefit from skilled therapeutic intervention in order to improve the following deficits and impairments:  Decreased strength, Decreased skin integrity, Decreased range of motion, Increased fascial restricitons, Increased muscle spasms, Pain  Visit Diagnosis: Stiffness of left shoulder, not elsewhere classified  Pain in left shoulder  Other symptoms and signs involving the musculoskeletal system    Problem List Patient Active Problem List   Diagnosis Date Noted  . Partial tear of left rotator cuff 11/23/2015  . Biceps tendon tear 11/23/2015  . Impingement syndrome of left shoulder 11/23/2015  . Post-operative state 03/13/2014  . Hemorrhoids 01/19/2014  . Arteriosclerotic cardiovascular disease (ASCVD)   . Diabetes mellitus, type II ([place])   . Gastroesophageal reflux disease   . Hyperlipidemia   . Hypertension   . Chronic kidney disease   . Tobacco abuse, in remission   . ERECTILE DYSFUNCTION, NON-ORGANIC 10/06/2009    Ailene Ravel, OTR/L,CBIS  [phone]  01/17/2016, 4:20 PM  [facility] [address] [place], [place], [address] Phone: [phone]   Fax:  [phone]  Name: [name] MRN: [id number] Date of Birth: [birth year]

## 2016-01-20 ENCOUNTER — Ambulatory Visit (HOSPITAL_COMMUNITY): Payer: 59

## 2016-01-20 ENCOUNTER — Encounter (HOSPITAL_COMMUNITY): Payer: Self-pay

## 2016-01-20 DIAGNOSIS — M25512 Pain in left shoulder: Secondary | ICD-10-CM

## 2016-01-20 DIAGNOSIS — M6281 Muscle weakness (generalized): Secondary | ICD-10-CM | POA: Diagnosis not present

## 2016-01-20 DIAGNOSIS — R29898 Other symptoms and signs involving the musculoskeletal system: Secondary | ICD-10-CM

## 2016-01-20 DIAGNOSIS — Z9889 Other specified postprocedural states: Secondary | ICD-10-CM | POA: Diagnosis not present

## 2016-01-20 DIAGNOSIS — M25612 Stiffness of left shoulder, not elsewhere classified: Secondary | ICD-10-CM | POA: Diagnosis not present

## 2016-01-20 DIAGNOSIS — M25511 Pain in right shoulder: Secondary | ICD-10-CM | POA: Diagnosis not present

## 2016-01-20 NOTE — Patient Instructions (Signed)
Repeat all exercises 10-15 times, 1-2 times per day.  1) Shoulder Protraction    Begin with elbows by your side, slowly "punch" straight out in front of you keeping arms/elbows straight.      2) Shoulder Flexion  Supine:     Standing:         Begin with arms at your side with thumbs pointed up, slowly raise both arms up and forward towards overhead.               3) Horizontal abduction/adduction  Supine:   Standing:           Begin with arms straight out in front of you, bring out to the side in at "T" shape. Keep arms straight entire time.                 4) Internal & External Rotation    *No band* -Stand with elbows at the side and elbows bent 90 degrees. Move your forearms away from your body, then bring back inward toward the body.     5) Shoulder Abduction  Supine:     Standing:       Lying on your back begin with your arms flat on the table next to your side. Slowly move your arms out to the side so that they go overhead, in a jumping jack or snow angel movement.    6) X to V arms (cheerleader move):  Begin with arms straight down, crossed in front of body in an "X". Keeping arms crossed, lift arms straight up overhead. Then spread arms apart into a "V" shape.  Bring back together into x and lower down to starting position.    7) W arms:  Begin with elbows bent and even with shoulders, hands pointing to ceiling. Keeping elbows at shoulder level, 1-shrug shoulders up, 2-squeeze shoulder blades together, and 3-relax.

## 2016-01-20 NOTE — Therapy (Signed)
[place], [place], [address] Phone: [phone]   Fax:  [phone]  Occupational Therapy Treatment  Patient Details  Name: [name] MRN: [id number] Date of Birth: December 12, 1955 Referring Provider: Dr. Joni Fears  Encounter Date: 01/20/2016      OT End of Session - 01/20/16 1638    Visit Number 11   Number of Visits 24   Date for OT Re-Evaluation 02/07/16   Authorization Type UMR   OT Start Time 1610  Pt arrived late   OT Stop Time 1640   OT Time Calculation (min) 30 min   Activity Tolerance Patient tolerated treatment well   Behavior During Therapy [facility] for tasks assessed/performed      Past Medical History  Diagnosis Date  . Arteriosclerotic cardiovascular disease (ASCVD) 2003    Non-Q MI with stent to RCA in 12/2001, normal EF, additional stents to mid CX and D1; cath in 02/2005-progressive or eye disease, 50-70% mid LAD, 90% distal LAD, 50% D1, 60-70% CX, small PDA with diffuse disease, patent RCA stent; repeat cath in 01/2008-no significant progression; non-Q MI in 02/2010 with 100% mid Cx at  prior stent, nondom. RCA with patent stent, 50% RI, nl EF, CABG-04/2010  . Gastroesophageal reflux disease   . Hyperlipidemia     Predominantly elevated triglycerides  . Hypertension   . Anxiety   . Erectile dysfunction   . Chronic obstructive pulmonary disease ([place])   . Chronic kidney disease     Creatinine of 1.44 in 04/2009  . Tobacco abuse, in remission     35 pack years; discontinued 05/2010  . Abnormal LFTs   . Pneumonia 2006    2006  . Adenomatous colon polyp 2012  . Internal hemorrhoids   . Hepatic steatosis   . Full dentures   . Wears glasses   . Allergy     to Red Meat  . Myocardial infarction ([place]) 2002, 2011  . Coronary artery disease     stent 2011  . Diabetes mellitus     Past Surgical History  Procedure Laterality Date  . Appendectomy    . Colonoscopy w/ polypectomy  2012   Dr. Olevia Perches; 2 polyps removed one of which was precancerous  . Hemorrhoid surgery N/A 02/24/2014    Procedure: HEMORRHOIDECTOMY;  Surgeon: Joyice Faster. Cornett, MD;  Location: [place];  Service: General;  Laterality: N/A;  . Coronary artery bypass graft  05/2010    4 vessels August 2011  . Cataract extraction w/phaco Left 09/21/2014    Procedure: CATARACT EXTRACTION PHACO AND INTRAOCULAR LENS PLACEMENT (IOC);  Surgeon: Tonny Branch, MD;  Location: [facility];  Service: Ophthalmology;  Laterality: Left;  CDE:9.77  . Eye surgery    . Shoulder arthroscopy with open rotator cuff repair and distal clavicle acrominectomy Left 11/23/2015    Procedure: SHOULDER ARTHROSCOPY WITH MINI-OPEN ROTATOR CUFF REPAIR, DISTAL CLAVICLE RESECTION AND SUBACROMIAL DECOMPRESSION.;  Surgeon: Garald Balding, MD;  Location: [place];  Service: Orthopedics;  Laterality: Left;  LEFT SHOULDER ARTHROSCOPIC SUBACROMIAL DECOMPRESSION, DISTAL CLAVICLE RESECTION, MINI-OPEN ROTATOR CUFF REPAIR.    There were no vitals filed for this visit.      Subjective Assessment - 01/20/16 1612    Subjective  S: My doctor visit got postponed until next Wednesday.   Currently in Pain? No/denies            [facility] OT Assessment - 01/20/16 1613    Assessment   Diagnosis S/P  Left Rotator Cuff Repair and Biceps Tenodesis   Precautions   Precautions Shoulder   Type of Shoulder Precautions A/ROM and then progress as tolerated.                   OT Treatments/Exercises (OP) - 01/20/16 1613    Exercises   Exercises Shoulder   Shoulder Exercises: Supine   Protraction PROM;5 reps   Horizontal ABduction PROM;5 reps   External Rotation PROM;5 reps   Internal Rotation PROM;5 reps   Flexion PROM;5 reps   ABduction PROM;5 reps   Shoulder Exercises: Standing   Protraction AROM;12 reps   Horizontal ABduction AROM;12 reps   External Rotation AROM;12 reps   Internal Rotation AROM;12 reps   Flexion AROM;12 reps   ABduction  AROM;12 reps   Shoulder Exercises: ROM/Strengthening   Over Head Lace 1'   "W" Arms 12X   X to V Arms 15X   Proximal Shoulder Strengthening, Seated 12X   Ball on Wall 1' flexion 1' abduction green ball                OT Education - 01/20/16 1638    Education provided Yes   Education Details A/ROM HEP provided this session.   Person(s) Educated Patient   Methods Explanation;Demonstration   Comprehension Verbalized understanding;Returned demonstration          OT Short Term Goals - 01/10/16 1603    OT SHORT TERM GOAL #1   Title Patient will be educated on a HEP for left shoulder and elbow mobility needed to return to PLOF with daily activities.    Time 6   Period Weeks   OT SHORT TERM GOAL #2   Title Patient will improve P/ROM of left shoulder to [facility] in order to improve independence with tying his shoes.    Time 6   Period Weeks   OT SHORT TERM GOAL #3   Title Patietn will demonstrate 3+/5 strength in his left shoulder and elbow for improved ability to lift equipment at work.    Time 6   Period Weeks   Status On-going   OT SHORT TERM GOAL #4   Title Patient will decrease pain in his left shoulder to 3/10 or better when lying on his back at night.    Time 6   Period Weeks   OT SHORT TERM GOAL #5   Title Patient will demonstrate min-mod fascial restrictions in his left shoulder region for greater mobility needed to use left shoulder as an active assist with daily tasks.    Time 6   Period Weeks           OT Long Term Goals - 12/14/15 1632    OT LONG TERM GOAL #1   Title Patient will use his left arm 100% with all daily, work, and leisure activities at prior level of function.   Time 12   Period Weeks   Status On-going   OT LONG TERM GOAL #2   Title Patient will have WNL A/ROM in his left shoulder and elbow in order to be able to tuck his shirt in and reach overhead to complete necessary work activities.    Time 12   Period Weeks   Status On-going   OT  LONG TERM GOAL #3   Title Patient will have 5/5 strength in his left shoulder and elbow in order to pick up ladders an other equipment at work.    Time 12   Period Weeks  Status On-going   OT LONG TERM GOAL #4   Title Patient will have 1/10 pain or less in his left shoulder when lying on his back or reaching overhead.    Time 12   Period Weeks   Status On-going   OT LONG TERM GOAL #5   Title Patient will demonstrate trace fascial restrictions in his left shoulder and elbow region in order to have the necessary pain free range of motion needed for ADL completion.    Time 12   Period Weeks   Status On-going               Plan - 01/20/16 1638    Clinical Impression Statement A: Continued with A/ROM exercises this session. Added overhead lacing. Patient states that his doctor's office called to ask him to move his apointment to next Wednesday so he has not had his follow-up apointment yet.   Plan P: Continue with A/ROM exercises to increase shoulder strength and stabilization.       Patient will benefit from skilled therapeutic intervention in order to improve the following deficits and impairments:  Decreased strength, Decreased skin integrity, Decreased range of motion, Increased fascial restricitons, Increased muscle spasms, Pain  Visit Diagnosis: Stiffness of left shoulder, not elsewhere classified  Pain in left shoulder  Other symptoms and signs involving the musculoskeletal system    Problem List Patient Active Problem List   Diagnosis Date Noted  . Partial tear of left rotator cuff 11/23/2015  . Biceps tendon tear 11/23/2015  . Impingement syndrome of left shoulder 11/23/2015  . Post-operative state 03/13/2014  . Hemorrhoids 01/19/2014  . Arteriosclerotic cardiovascular disease (ASCVD)   . Diabetes mellitus, type II ([place])   . Gastroesophageal reflux disease   . Hyperlipidemia   . Hypertension   . Chronic kidney disease   . Tobacco abuse, in remission   .  ERECTILE DYSFUNCTION, NON-ORGANIC 10/06/2009    Marijo Conception OTA student 01/20/2016, 4:48 PM  [facility] [address] [place], [place], [address] Phone: [phone]   Fax:  [phone]  Name: JAYZEON WHOOLERY MRN: [id number] Date of Birth: 1956-06-06  Ailene Ravel, OTR/L,CBIS  [phone]  Note reviewed by clinical instructor and accurately reflects treatment session.

## 2016-01-24 ENCOUNTER — Ambulatory Visit (HOSPITAL_COMMUNITY): Payer: 59

## 2016-01-24 ENCOUNTER — Encounter (HOSPITAL_COMMUNITY): Payer: Self-pay

## 2016-01-24 DIAGNOSIS — M25512 Pain in left shoulder: Secondary | ICD-10-CM | POA: Diagnosis not present

## 2016-01-24 DIAGNOSIS — R29898 Other symptoms and signs involving the musculoskeletal system: Secondary | ICD-10-CM

## 2016-01-24 DIAGNOSIS — M6281 Muscle weakness (generalized): Secondary | ICD-10-CM | POA: Diagnosis not present

## 2016-01-24 DIAGNOSIS — Z9889 Other specified postprocedural states: Secondary | ICD-10-CM | POA: Diagnosis not present

## 2016-01-24 DIAGNOSIS — M25612 Stiffness of left shoulder, not elsewhere classified: Secondary | ICD-10-CM | POA: Diagnosis not present

## 2016-01-24 DIAGNOSIS — M25511 Pain in right shoulder: Secondary | ICD-10-CM | POA: Diagnosis not present

## 2016-01-24 NOTE — Therapy (Addendum)
[place], [place], [address] Phone: [phone]   Fax:  [phone]  Occupational Therapy Treatment  Patient Details  Name: [name] [name] MRN: [id number] Date of Birth: Mar 09, 1956 Referring Provider: Dr. Joni Fears  Encounter Date: 01/24/2016      OT End of Session - 01/24/16 1553    Visit Number 12   Number of Visits 24   Date for OT Re-Evaluation 02/07/16   Authorization Type UMR   OT Start Time 1515   OT Stop Time 1555   OT Time Calculation (min) 40 min   Activity Tolerance Patient tolerated treatment well   Behavior During Therapy [facility] for tasks assessed/performed      Past Medical History  Diagnosis Date  . Arteriosclerotic cardiovascular disease (ASCVD) 2003    Non-Q MI with stent to RCA in 12/2001, normal EF, additional stents to mid CX and D1; cath in 02/2005-progressive or eye disease, 50-70% mid LAD, 90% distal LAD, 50% D1, 60-70% CX, small PDA with diffuse disease, patent RCA stent; repeat cath in 01/2008-no significant progression; non-Q MI in 02/2010 with 100% mid Cx at  prior stent, nondom. RCA with patent stent, 50% RI, nl EF, CABG-04/2010  . Gastroesophageal reflux disease   . Hyperlipidemia     Predominantly elevated triglycerides  . Hypertension   . Anxiety   . Erectile dysfunction   . Chronic obstructive pulmonary disease ([place])   . Chronic kidney disease     Creatinine of 1.44 in 04/2009  . Tobacco abuse, in remission     35 pack years; discontinued 05/2010  . Abnormal LFTs   . Pneumonia 2006    2006  . Adenomatous colon polyp 2012  . Internal hemorrhoids   . Hepatic steatosis   . Full dentures   . Wears glasses   . Allergy     to Red Meat  . Myocardial infarction ([place]) 2002, 2011  . Coronary artery disease     stent 2011  . Diabetes mellitus     Past Surgical History  Procedure Laterality Date  . Appendectomy    . Colonoscopy w/ polypectomy  2012    Dr. Olevia Perches; 2  polyps removed one of which was precancerous  . Hemorrhoid surgery N/A 02/24/2014    Procedure: HEMORRHOIDECTOMY;  Surgeon: Joyice Faster. Cornett, MD;  Location: [place];  Service: General;  Laterality: N/A;  . Coronary artery bypass graft  05/2010    4 vessels August 2011  . Cataract extraction w/phaco Left 09/21/2014    Procedure: CATARACT EXTRACTION PHACO AND INTRAOCULAR LENS PLACEMENT (IOC);  Surgeon: Tonny Branch, MD;  Location: [facility];  Service: Ophthalmology;  Laterality: Left;  CDE:9.77  . Eye surgery    . Shoulder arthroscopy with open rotator cuff repair and distal clavicle acrominectomy Left 11/23/2015    Procedure: SHOULDER ARTHROSCOPY WITH MINI-OPEN ROTATOR CUFF REPAIR, DISTAL CLAVICLE RESECTION AND SUBACROMIAL DECOMPRESSION.;  Surgeon: Garald Balding, MD;  Location: [place];  Service: Orthopedics;  Laterality: Left;  LEFT SHOULDER ARTHROSCOPIC SUBACROMIAL DECOMPRESSION, DISTAL CLAVICLE RESECTION, MINI-OPEN ROTATOR CUFF REPAIR.    There were no vitals filed for this visit.      Subjective [name] - 01/24/16 1518    Subjective  S: No pain in the shoulder.    Currently in Pain? No/denies            [name] [name] - 01/24/16 1518    [name]   Diagnosis S/P Left Rotator Cuff Repair  and Biceps Tenodesis   Precautions   Precautions Shoulder   Type of Shoulder Precautions A/ROM and then progress as tolerated.                   OT Treatments/Exercises (OP) - 01/24/16 1519    Exercises   Exercises Shoulder   Shoulder Exercises: Supine   Protraction PROM;5 reps;Strengthening;10 reps   Protraction Weight (lbs) 1lb   Horizontal ABduction PROM;5 reps; strengthening; 10 reps   Horizontal ABduction Weight (lbs) 1lb   External Rotation PROM;5 reps;Strengthening;10 reps   External Rotation Weight (lbs) 1lb   Internal Rotation PROM;5 reps;Strengthening;10 reps   Internal Rotation Weight (lbs) 1lb   Flexion PROM;5 reps;Strengthening;10 reps    Shoulder Flexion Weight (lbs) 1lb   ABduction PROM;5 reps;Strengthening;10 reps   Shoulder ABduction Weight (lbs) 1lb   Shoulder Exercises: Standing   Protraction Strengthening;10 reps   Protraction Weight (lbs) 1lb   Horizontal ABduction Strengthening;10 reps   Horizontal ABduction Weight (lbs) 1lb   External Rotation Strengthening;10 reps   External Rotation Weight (lbs) 1lb   Internal Rotation Strengthening;10 reps   Internal Rotation Weight (lbs) 1lb   Flexion Strengthening;10 reps   Shoulder Flexion Weight (lbs) 1lb   ABduction Strengthening;10 reps   Shoulder ABduction Weight (lbs) 1lb   Extension Theraband;10 reps   Theraband Level (Shoulder Extension) Level 2 (Red)   Row Theraband;10 reps   Theraband Level (Shoulder Row) Level 2 (Red)   Retraction Theraband;10 reps   Theraband Level (Shoulder Retraction) Level 2 (Red)   Shoulder Exercises: ROM/Strengthening   UBE (Upper Arm Bike) Level 3 3' forward 3' reverse   Cybex Press 1 plate;15 reps   Cybex Row 2 plate;15 reps   Over Head Lace 2'  1 rest break at 1' mark   "W" Arms 10X with 1lb weight   X to V Arms 10X with 1lb weight   Proximal Shoulder Strengthening, Supine 10X  1lb weight   Proximal Shoulder Strengthening, Seated 10X  1lb weight standing   Ball on Wall 1' flexion 1' abduction green ball                OT Education - 01/24/16 1553    Education provided Yes   Education Details Theraband HEP provided.   Person(s) Educated Patient   Methods Explanation;Demonstration;Tactile cues;Verbal cues;Handout   Comprehension Verbalized understanding;Returned demonstration;Tactile cues required;Verbal cues required          OT Short Term Goals - 01/10/16 1603    OT SHORT TERM GOAL #1   Title Patient will be educated on a HEP for left shoulder and elbow mobility needed to return to PLOF with daily activities.    Time 6   Period Weeks   OT SHORT TERM GOAL #2   Title Patient will improve P/ROM of left  shoulder to [facility] in order to improve independence with tying his shoes.    Time 6   Period Weeks   OT SHORT TERM GOAL #3   Title Patietn will demonstrate 3+/5 strength in his left shoulder and elbow for improved ability to lift equipment at work.    Time 6   Period Weeks   Status On-going   OT SHORT TERM GOAL #4   Title Patient will decrease pain in his left shoulder to 3/10 or better when lying on his back at night.    Time 6   Period Weeks   OT SHORT TERM GOAL #5   Title Patient will demonstrate min-mod  fascial restrictions in his left shoulder region for greater mobility needed to use left shoulder as an active assist with daily tasks.    Time 6   Period Weeks           OT Long Term Goals - 12/14/15 1632    OT LONG TERM GOAL #1   Title Patient will use his left arm 100% with all daily, work, and leisure activities at prior level of function.   Time 12   Period Weeks   Status On-going   OT LONG TERM GOAL #2   Title Patient will have WNL A/ROM in his left shoulder and elbow in order to be able to tuck his shirt in and reach overhead to complete necessary work activities.    Time 12   Period Weeks   Status On-going   OT LONG TERM GOAL #3   Title Patient will have 5/5 strength in his left shoulder and elbow in order to pick up ladders an other equipment at work.    Time 12   Period Weeks   Status On-going   OT LONG TERM GOAL #4   Title Patient will have 1/10 pain or less in his left shoulder when lying on his back or reaching overhead.    Time 12   Period Weeks   Status On-going   OT LONG TERM GOAL #5   Title Patient will demonstrate trace fascial restrictions in his left shoulder and elbow region in order to have the necessary pain free range of motion needed for ADL completion.    Time 12   Period Weeks   Status On-going               Plan - 01/24/16 1553    Clinical Impression Statement A: Progressed to strengthening this session. Patient tolerated  exercises well. Provided Theraband HEP with verbal and tactile cues.   Plan P: Continue with strengthning with 1 pound weight and red Theraband to increase LUE ROM, strength, and stabilization in a pain free zone.      Patient will benefit from skilled therapeutic intervention in order to improve the following deficits and impairments:     Visit Diagnosis: Stiffness of left shoulder, not elsewhere classified  Pain in left shoulder  Other symptoms and signs involving the musculoskeletal system    Problem List Patient Active Problem List   Diagnosis Date Noted  . Partial tear of left rotator cuff 11/23/2015  . Biceps tendon tear 11/23/2015  . Impingement syndrome of left shoulder 11/23/2015  . Post-operative state 03/13/2014  . Hemorrhoids 01/19/2014  . Arteriosclerotic cardiovascular disease (ASCVD)   . Diabetes mellitus, type II ([place])   . Gastroesophageal reflux disease   . Hyperlipidemia   . Hypertension   . Chronic kidney disease   . Tobacco abuse, in remission   . ERECTILE DYSFUNCTION, NON-ORGANIC 10/06/2009    [name] [name] OTA student 01/24/2016, 3:55 PM  [place] [address] [place], [place], [address] Phone: [phone]   Fax:  [phone]  Name: ASSAD MUHAMMED MRN: [id number] Date of Birth: 1956/06/28   Ailene Ravel, OTR/L,CBIS  [phone]  Note reviewed by clinical instructor and accurately reflects treatment session.

## 2016-01-24 NOTE — Patient Instructions (Signed)
Complete exercises 2-3 times per day.  (Home) Extension: Isometric / Bilateral Arm Retraction - Sitting   Facing anchor, hold hands and elbow at shoulder height, with elbow bent.  Pull arms back to squeeze shoulder blades together. Repeat 10-15 times.  Copyright  VHI. All rights reserved.   (Home) Retraction: Row - Bilateral (Anchor)   Facing anchor, arms reaching forward, pull hands toward stomach, keeping elbows bent and at your sides and pinching shoulder blades together. Repeat 10-15 times.  Copyright  VHI. All rights reserved.   (Clinic) Extension / Flexion (Assist)   Face anchor, pull arms back, keeping elbow straight, and squeze shoulder blades together. Repeat 10-15 times.   Copyright  VHI. All rights reserved.

## 2016-01-26 ENCOUNTER — Encounter: Payer: Self-pay | Admitting: Gastroenterology

## 2016-01-27 ENCOUNTER — Ambulatory Visit (HOSPITAL_COMMUNITY): Payer: 59

## 2016-01-27 ENCOUNTER — Encounter (HOSPITAL_COMMUNITY): Payer: Self-pay

## 2016-01-27 DIAGNOSIS — R29898 Other symptoms and signs involving the musculoskeletal system: Secondary | ICD-10-CM

## 2016-01-27 DIAGNOSIS — M25512 Pain in left shoulder: Secondary | ICD-10-CM

## 2016-01-27 DIAGNOSIS — M25612 Stiffness of left shoulder, not elsewhere classified: Secondary | ICD-10-CM | POA: Diagnosis not present

## 2016-01-27 DIAGNOSIS — Z9889 Other specified postprocedural states: Secondary | ICD-10-CM | POA: Diagnosis not present

## 2016-01-27 DIAGNOSIS — M25511 Pain in right shoulder: Secondary | ICD-10-CM | POA: Diagnosis not present

## 2016-01-27 DIAGNOSIS — M6281 Muscle weakness (generalized): Secondary | ICD-10-CM | POA: Diagnosis not present

## 2016-01-27 NOTE — Therapy (Addendum)
[place], [place], [address] Phone: [phone]   Fax:  [phone]  Occupational Therapy Treatment  Patient Details  Name: [name] MRN: [id number] Date of Birth: [birth year] Referring Provider: Dr. Joni Fears  Encounter Date: 01/27/2016      OT End of Session - 01/27/16 1642    Visit Number 13   Number of Visits 24   Date for OT Re-Evaluation 02/07/16   Authorization Type UMR   OT Start Time 1605   OT Stop Time 1645   OT Time Calculation (min) 40 min   Activity Tolerance Patient tolerated treatment well   Behavior During Therapy [facility] for tasks assessed/performed      Past Medical History  Diagnosis Date  . Arteriosclerotic cardiovascular disease (ASCVD) 2003    Non-Q MI with stent to RCA in 12/2001, normal EF, additional stents to mid CX and D1; cath in 02/2005-progressive or eye disease, 50-70% mid LAD, 90% distal LAD, 50% D1, 60-70% CX, small PDA with diffuse disease, patent RCA stent; repeat cath in 01/2008-no significant progression; non-Q MI in 02/2010 with 100% mid Cx at  prior stent, nondom. RCA with patent stent, 50% RI, nl EF, CABG-04/2010  . Gastroesophageal reflux disease   . Hyperlipidemia     Predominantly elevated triglycerides  . Hypertension   . Anxiety   . Erectile dysfunction   . Chronic obstructive pulmonary disease ([place])   . Chronic kidney disease     Creatinine of 1.44 in 04/2009  . Tobacco abuse, in remission     35 pack years; discontinued 05/2010  . Abnormal LFTs   . Pneumonia 2006    2006  . Adenomatous colon polyp 2012  . Internal hemorrhoids   . Hepatic steatosis   . Full dentures   . Wears glasses   . Allergy     to [place]  . Myocardial infarction ([place]) 2002, 2011  . Coronary artery disease     stent 2011  . Diabetes mellitus     Past Surgical History  Procedure Laterality Date  . Appendectomy    . Colonoscopy w/ polypectomy  2012    Dr. Olevia Perches; 2  polyps removed one of which was precancerous  . Hemorrhoid surgery N/A 02/24/2014    Procedure: HEMORRHOIDECTOMY;  Surgeon: Joyice Faster. Cornett, MD;  Location: [facility];  Service: General;  Laterality: N/A;  . Coronary artery bypass graft  05/2010    4 vessels August 2011  . Cataract extraction w/phaco Left 09/21/2014    Procedure: CATARACT EXTRACTION PHACO AND INTRAOCULAR LENS PLACEMENT (IOC);  Surgeon: Tonny Branch, MD;  Location: [facility];  Service: Ophthalmology;  Laterality: Left;  CDE:9.77  . Eye surgery    . Shoulder arthroscopy with open rotator cuff repair and distal clavicle acrominectomy Left 11/23/2015    Procedure: SHOULDER ARTHROSCOPY WITH MINI-OPEN ROTATOR CUFF REPAIR, DISTAL CLAVICLE RESECTION AND SUBACROMIAL DECOMPRESSION.;  Surgeon: Garald Balding, MD;  Location: [facility];  Service: Orthopedics;  Laterality: Left;  LEFT SHOULDER ARTHROSCOPIC SUBACROMIAL DECOMPRESSION, DISTAL CLAVICLE RESECTION, MINI-OPEN ROTATOR CUFF REPAIR.    There were no vitals filed for this visit.      Subjective Assessment - 01/27/16 1642    Subjective  S: I don't hurt at all.   Currently in Pain? No/denies            [facility] OT Assessment - 01/27/16 1610    Assessment   Diagnosis S/P Left Rotator Cuff Repair and  Biceps Tenodesis   Precautions   Precautions Shoulder   Type of Shoulder Precautions A/ROM and then progress as tolerated.                   OT Treatments/Exercises (OP) - 01/27/16 1610    Exercises   Exercises Shoulder   Shoulder Exercises: Supine   Protraction PROM;5 reps;Strengthening;12 reps   Protraction Weight (lbs) 2lb   Horizontal ABduction PROM;5 reps;Strengthening;12 reps   Horizontal ABduction Weight (lbs) 2lb   External Rotation PROM;5 reps;Strengthening;12 reps   External Rotation Weight (lbs) 2lb   Internal Rotation PROM;5 reps;Strengthening;12 reps   Internal Rotation Weight (lbs) 2lb   Flexion PROM;5 reps;Strengthening;12 reps   Shoulder  Flexion Weight (lbs) 2lb   ABduction PROM;5 reps;Strengthening;12 reps   Shoulder ABduction Weight (lbs) 2lb   Shoulder Exercises: Standing   Protraction Strengthening;12 reps   Protraction Weight (lbs) 2lb   Horizontal ABduction Strengthening;12 reps   Horizontal ABduction Weight (lbs) 2lb   External Rotation Strengthening;12 reps   External Rotation Weight (lbs) 2lb   Internal Rotation Strengthening;12 reps   Internal Rotation Weight (lbs) 2lb   Flexion Strengthening;12 reps   Shoulder Flexion Weight (lbs) 2lb   ABduction Strengthening;12 reps   Shoulder ABduction Weight (lbs) 2lb   Other Standing Exercises Chest pull, PNF strengthening, resisted external rotation 10X with red Theraband   Shoulder Exercises: ROM/Strengthening   Cybex Press 2 plate;15 reps   Cybex Row 3 plate;15 reps   "W" Arms 12X with 2lb weight   X to V Arms 12X with 2lb weight   Proximal Shoulder Strengthening, Supine 10X  with 2lb weight   Proximal Shoulder Strengthening, Seated 10X  standing with 2lb weight                OT Education - 01/27/16 1624    Education provided Yes   Education Details Theraband HEP provided.   Person(s) Educated Patient   Methods Explanation;Demonstration;Tactile cues;Verbal cues;Handout   Comprehension Verbalized understanding;Returned demonstration;Verbal cues required;Tactile cues required          OT Short Term Goals - 01/10/16 1603    OT SHORT TERM GOAL #1   Title Patient will be educated on a HEP for left shoulder and elbow mobility needed to return to PLOF with daily activities.    Time 6   Period Weeks   OT SHORT TERM GOAL #2   Title Patient will improve P/ROM of left shoulder to [facility] in order to improve independence with tying his shoes.    Time 6   Period Weeks   OT SHORT TERM GOAL #3   Title Patietn will demonstrate 3+/5 strength in his left shoulder and elbow for improved ability to lift equipment at work.    Time 6   Period Weeks   Status  On-going   OT SHORT TERM GOAL #4   Title Patient will decrease pain in his left shoulder to 3/10 or better when lying on his back at night.    Time 6   Period Weeks   OT SHORT TERM GOAL #5   Title Patient will demonstrate min-mod fascial restrictions in his left shoulder region for greater mobility needed to use left shoulder as an active assist with daily tasks.    Time 6   Period Weeks           OT Long Term Goals - 12/14/15 1632    OT LONG TERM GOAL #1   Title Patient will use his  left arm 100% with all daily, work, and leisure activities at prior level of function.   Time 12   Period Weeks   Status On-going   OT LONG TERM GOAL #2   Title Patient will have WNL A/ROM in his left shoulder and elbow in order to be able to tuck his shirt in and reach overhead to complete necessary work activities.    Time 12   Period Weeks   Status On-going   OT LONG TERM GOAL #3   Title Patient will have 5/5 strength in his left shoulder and elbow in order to pick up ladders an other equipment at work.    Time 12   Period Weeks   Status On-going   OT LONG TERM GOAL #4   Title Patient will have 1/10 pain or less in his left shoulder when lying on his back or reaching overhead.    Time 12   Period Weeks   Status On-going   OT LONG TERM GOAL #5   Title Patient will demonstrate trace fascial restrictions in his left shoulder and elbow region in order to have the necessary pain free range of motion needed for ADL completion.    Time 12   Period Weeks   Status On-going               Plan - 01/27/16 1643    Clinical Impression Statement A: Increased weight on supine and standing exercises. Provided Theraband HEP with verbal and tactile cues.   Plan P: Per doctor, patient is dereasing therapy to every other week for 2 more treatments.       Patient will benefit from skilled therapeutic intervention in order to improve the following deficits and impairments:     Visit  Diagnosis: Stiffness of left shoulder, not elsewhere classified  Pain in left shoulder  Other symptoms and signs involving the musculoskeletal system    Problem List Patient Active Problem List   Diagnosis Date Noted  . Partial tear of left rotator cuff 11/23/2015  . Biceps tendon tear 11/23/2015  . Impingement syndrome of left shoulder 11/23/2015  . Post-operative state 03/13/2014  . Hemorrhoids 01/19/2014  . Arteriosclerotic cardiovascular disease (ASCVD)   . Diabetes mellitus, type II ([place])   . Gastroesophageal reflux disease   . Hyperlipidemia   . Hypertension   . Chronic kidney disease   . Tobacco abuse, in remission   . ERECTILE DYSFUNCTION, NON-ORGANIC 10/06/2009    Marijo Conception OTA student 01/27/2016, 4:45 PM  [facility] [address] [place], [place], [address] Phone: [phone]   Fax:  [phone]  Name: [name] MRN: [id number] Date of Birth: 1956-04-12  Ailene Ravel, OTR/L,CBIS  [phone] Note reviewed by clinical instructor and accurately reflects treatment session.    OCCUPATIONAL THERAPY DISCHARGE SUMMARY  Visits from Start of Care: 13  Current functional level related to goals / functional outcomes: All goals have been met. Per MD request, patient will be discharged.    Patient was given an HEP update at last session.  Plan: Patient agrees to discharge.  Patient goals were met. Patient is being discharged due to meeting the stated rehab goals.  ?????

## 2016-01-27 NOTE — Patient Instructions (Signed)
Complete 10-12 reps 2-3 times a day  Strengthening: Chest Pull - Resisted   Hold Theraband in front of body with hands about shoulder width a part. Pull band a part and back together slowly.   http://orth.exer.us/926   Copyright  VHI. All rights reserved.   PNF Strengthening: Resisted   Standing with resistive band around each hand, bring right arm up and away, thumb back.                            Resisted External Rotation: in Neutral - Bilateral   Sit or stand, tubing in both hands, elbows at sides, bent to 90, forearms forward. Pinch shoulder blades together and rotate forearms out. Keep elbows at sides.   http://orth.exer.us/966   Copyright  VHI. All rights reserved.   PNF Strengthening: Resisted   Standing, hold resistive band above head. Bring right arm down and out from side.   http://orth.exer.us/922   Copyright  VHI. All rights reserved.

## 2016-01-31 ENCOUNTER — Ambulatory Visit (HOSPITAL_COMMUNITY): Payer: 59

## 2016-02-03 ENCOUNTER — Encounter (HOSPITAL_COMMUNITY): Payer: 59

## 2016-02-10 ENCOUNTER — Ambulatory Visit (HOSPITAL_COMMUNITY): Payer: 59

## 2016-02-10 ENCOUNTER — Telehealth (HOSPITAL_COMMUNITY): Payer: Self-pay

## 2016-02-10 MED FILL — TRESIBA FLEXTOUCH 200 UNITS: 200 | 30 days supply | Qty: 18 | Fill #3

## 2016-02-10 NOTE — Telephone Encounter (Signed)
Md says he is doing fine, patient requested to be discharged.

## 2016-02-14 ENCOUNTER — Encounter: Payer: Self-pay | Admitting: Gastroenterology

## 2016-02-23 ENCOUNTER — Encounter (HOSPITAL_COMMUNITY): Payer: Self-pay

## 2016-03-16 DIAGNOSIS — E1142 Type 2 diabetes mellitus with diabetic polyneuropathy: Secondary | ICD-10-CM | POA: Diagnosis not present

## 2016-03-16 DIAGNOSIS — I2510 Atherosclerotic heart disease of native coronary artery without angina pectoris: Secondary | ICD-10-CM | POA: Diagnosis not present

## 2016-03-16 DIAGNOSIS — Z7984 Long term (current) use of oral hypoglycemic drugs: Secondary | ICD-10-CM | POA: Diagnosis not present

## 2016-03-16 DIAGNOSIS — Z79899 Other long term (current) drug therapy: Secondary | ICD-10-CM | POA: Diagnosis not present

## 2016-03-16 DIAGNOSIS — K7581 Nonalcoholic steatohepatitis (NASH): Secondary | ICD-10-CM | POA: Diagnosis not present

## 2016-03-16 DIAGNOSIS — N529 Male erectile dysfunction, unspecified: Secondary | ICD-10-CM | POA: Diagnosis not present

## 2016-03-16 DIAGNOSIS — Z5181 Encounter for therapeutic drug level monitoring: Secondary | ICD-10-CM | POA: Diagnosis not present

## 2016-03-16 DIAGNOSIS — E1122 Type 2 diabetes mellitus with diabetic chronic kidney disease: Secondary | ICD-10-CM | POA: Diagnosis not present

## 2016-03-31 ENCOUNTER — Ambulatory Visit (AMBULATORY_SURGERY_CENTER): Payer: Self-pay | Admitting: *Deleted

## 2016-03-31 VITALS — Ht 64.0 in | Wt 187.8 lb

## 2016-03-31 DIAGNOSIS — Z86010 Personal history of colonic polyps: Secondary | ICD-10-CM

## 2016-03-31 MED ORDER — NA SULFATE-K SULFATE-MG SULF 17.5-3.13-1.6 GM/177ML PO SOLN: ORAL | Status: DC

## 2016-03-31 MED FILL — SUPREP BOWEL PREP KIT: 17.5-3.13-1 | 1 days supply | Qty: 354 | Fill #0

## 2016-03-31 NOTE — Progress Notes (Signed)
No allergies to eggs or soy. No problems with anesthesia.  Pt given Emmi instructions for colonoscopy  No oxygen use  No diet drug use  

## 2016-04-04 ENCOUNTER — Other Ambulatory Visit: Payer: Self-pay | Admitting: Cardiology

## 2016-04-04 MED FILL — TRESIBA FLEXTOUCH 200 UNITS: 200 | 30 days supply | Qty: 18 | Fill #4

## 2016-04-04 MED FILL — AMLODIPINE BESYLATE 10 MG T: 10 | 90 days supply | Qty: 90 | Fill #0

## 2016-04-04 MED FILL — LISINOPRIL-HCTZ 10-12.5 MG: 10-12.5 | 90 days supply | Qty: 90 | Fill #0

## 2016-04-04 MED FILL — metFORMIN HCL 1000 MG TABS: 1000 | 90 days supply | Qty: 180 | Fill #0

## 2016-04-14 ENCOUNTER — Encounter: Payer: Self-pay | Admitting: Gastroenterology

## 2016-04-14 ENCOUNTER — Ambulatory Visit (AMBULATORY_SURGERY_CENTER): Payer: 59 | Admitting: Gastroenterology

## 2016-04-14 VITALS — BP 115/73 | HR 71 | Temp 98.4°F | Resp 17 | Ht 64.0 in | Wt 187.0 lb

## 2016-04-14 DIAGNOSIS — Z86010 Personal history of colonic polyps: Secondary | ICD-10-CM | POA: Diagnosis present

## 2016-04-14 DIAGNOSIS — D122 Benign neoplasm of ascending colon: Secondary | ICD-10-CM

## 2016-04-14 DIAGNOSIS — I2510 Atherosclerotic heart disease of native coronary artery without angina pectoris: Secondary | ICD-10-CM | POA: Diagnosis not present

## 2016-04-14 DIAGNOSIS — Z1211 Encounter for screening for malignant neoplasm of colon: Secondary | ICD-10-CM | POA: Diagnosis not present

## 2016-04-14 LAB — GLUCOSE, CAPILLARY
GLUCOSE-CAPILLARY: 105 mg/dL — AB (ref 65–99)
Glucose-Capillary: 124 mg/dL — ABNORMAL HIGH (ref 65–99)

## 2016-04-14 MED ORDER — SODIUM CHLORIDE 0.9 % IV SOLN: 500.0000 mL | INTRAVENOUS | Status: DC

## 2016-04-14 NOTE — Op Note (Signed)
[facility] Patient Name: [name] Procedure Date: 04/14/2016 9:36 AM MRN: [id number] Endoscopist: Mallie Mussel L. Loletha Carrow , MD Age: 60 Referring MD:  Date of Birth: [birth year] Gender: Male Account #: [id number] Procedure:                Colonoscopy Indications:              High risk colon cancer surveillance: Personal                            history of sessile serrated colon polyp (less than                            10 mm in size) with no dysplasia (02/2011) Medicines:                Monitored Anesthesia Care Procedure:                Pre-Anesthesia Assessment:                           - Prior to the procedure, a History and Physical                            was performed, and patient medications and                            allergies were reviewed. The patient's tolerance of                            previous anesthesia was also reviewed. The risks                            and benefits of the procedure and the sedation                            options and risks were discussed with the patient.                            All questions were answered, and informed consent                            was obtained. Prior Anticoagulants: The patient has                            taken aspirin, last dose was 1 day prior to                            procedure. ASA Grade Assessment: III - A patient                            with severe systemic disease. After reviewing the                            risks and benefits, the patient was deemed in  satisfactory condition to undergo the procedure.                           After obtaining informed consent, the colonoscope                            was passed under direct vision. Throughout the                            procedure, the patient's blood pressure, pulse, and                            oxygen saturations were monitored continuously. The                            Model CF-HQ190L  [id number]) scope was introduced                            through the anus and advanced to the the cecum,                            identified by appendiceal orifice and ileocecal                            valve. The colonoscopy was performed without                            difficulty. The patient tolerated the procedure                            well. The quality of the bowel preparation was                            good. The ileocecal valve, appendiceal orifice, and                            rectum were photographed. The bowel preparation                            used was Miralax. Scope In: 9:52:22 AM Scope Out: 10:18:19 AM Scope Withdrawal Time: 0 hours 18 minutes 35 seconds  Total Procedure Duration: 0 hours 25 minutes 57 seconds  Findings:                 The perianal and digital rectal examinations were                            normal.                           Multiple small-mouthed diverticula were found in                            the left colon.  Two sessile polyps were found in the proximal                            ascending colon and distal ascending colon. The                            polyps were 2 mm in size. These polyps were removed                            with a piecemeal technique using a cold biopsy                            forceps. Resection and retrieval were complete.                           Internal hemorrhoids were found during                            retroflexion. The hemorrhoids were Grade I                            (internal hemorrhoids that do not prolapse).                           The exam was otherwise without abnormality. Complications:            No immediate complications. Estimated Blood Loss:     Estimated blood loss: none. Impression:               - Diverticulosis in the left colon.                           - Two 2 mm polyps in the proximal ascending colon                            and in  the distal ascending colon, removed                            piecemeal using a cold biopsy forceps. Resected and                            retrieved.                           - Internal hemorrhoids.                           - The examination was otherwise normal. Recommendation:           - Patient has a contact number available for                            emergencies. The signs and symptoms of potential                            delayed complications  were discussed with the                            patient. Return to normal activities tomorrow.                            Written discharge instructions were provided to the                            patient.                           - Resume previous diet.                           - Continue present medications.                           - Await pathology results.                           - Repeat colonoscopy is recommended for                            surveillance. The colonoscopy date will be                            determined after pathology results from today's                            exam become available for review. Henry L. Loletha Carrow, MD 04/14/2016 10:23:05 AM This report has been signed electronically.

## 2016-04-14 NOTE — Patient Instructions (Signed)
YOU HAD AN ENDOSCOPIC PROCEDURE TODAY AT [facility]:   Refer to the procedure report that was given to you for any specific questions about what was found during the examination.  If the procedure report does not answer your questions, please call your gastroenterologist to clarify.  If you requested that your care partner not be given the details of your procedure findings, then the procedure report has been included in a sealed envelope for you to review at your convenience later.  YOU SHOULD EXPECT: Some feelings of bloating in the abdomen. Passage of more gas than usual.  Walking can help get rid of the air that was put into your GI tract during the procedure and reduce the bloating. If you had a lower endoscopy (such as a colonoscopy or flexible sigmoidoscopy) you may notice spotting of blood in your stool or on the toilet paper. If you underwent a bowel prep for your procedure, you may not have a normal bowel movement for a few days.  Please Note:  You might notice some irritation and congestion in your nose or some drainage.  This is from the oxygen used during your procedure.  There is no need for concern and it should clear up in a day or so.  SYMPTOMS TO REPORT IMMEDIATELY:   Following lower endoscopy (colonoscopy or flexible sigmoidoscopy):  Excessive amounts of blood in the stool  Significant tenderness or worsening of abdominal pains  Swelling of the abdomen that is new, acute  Fever of 100F or higher   For urgent or emergent issues, a gastroenterologist can be reached at any hour by calling [phone].   DIET: Your first meal following the procedure should be a small meal and then it is ok to progress to your normal diet. Heavy or fried foods are harder to digest and may make you feel nauseous or bloated.  Likewise, meals heavy in dairy and vegetables can increase bloating.  Drink plenty of fluids but you should avoid alcoholic beverages for 24  hours.  ACTIVITY:  You should plan to take it easy for the rest of today and you should NOT DRIVE or use heavy machinery until tomorrow (because of the sedation medicines used during the test).    FOLLOW UP: Our staff will call the number listed on your records the next business day following your procedure to check on you and address any questions or concerns that you may have regarding the information given to you following your procedure. If we do not reach you, we will leave a message.  However, if you are feeling well and you are not experiencing any problems, there is no need to return our call.  We will assume that you have returned to your regular daily activities without incident.  If any biopsies were taken you will be contacted by phone or by letter within the next 1-3 weeks.  Please call us at [phone] if you have not heard about the biopsies in 3 weeks.    SIGNATURES/CONFIDENTIALITY: You and/or your care partner have signed paperwork which will be entered into your electronic medical record.  These signatures attest to the fact that that the information above on your After Visit Summary has been reviewed and is understood.  Full responsibility of the confidentiality of this discharge information lies with you and/or your care-partner.  Polyps, hemorrhoids-handouts given  Repeat colonoscopy will be determined by pathology.

## 2016-04-14 NOTE — Progress Notes (Signed)
A/ox3 pleased with MAC, report to April RN 

## 2016-04-14 NOTE — Progress Notes (Signed)
Called to room to assist during endoscopic procedure.  Patient ID and intended procedure confirmed with present staff. Received instructions for my participation in the procedure from the performing physician.  

## 2016-04-17 ENCOUNTER — Telehealth: Payer: Self-pay

## 2016-04-17 NOTE — Telephone Encounter (Signed)
  Follow up Call-  Call back number 04/14/2016  Post procedure Call Back phone  # [phone]  Permission to leave phone message Yes     Patient questions:  Do you have a fever, pain , or abdominal swelling? No. Pain Score  0 *  Have you tolerated food without any problems? Yes.    Have you been able to return to your normal activities? Yes.    Do you have any questions about your discharge instructions: Diet   No. Medications  No. Follow up visit  No.  Do you have questions or concerns about your Care? No.  Actions: * If pain score is 4 or above: No action needed, pain <4.

## 2016-04-19 ENCOUNTER — Encounter: Payer: Self-pay | Admitting: Gastroenterology

## 2016-04-28 ENCOUNTER — Ambulatory Visit: Payer: 59 | Admitting: Cardiology

## 2016-05-01 ENCOUNTER — Encounter: Payer: Self-pay | Admitting: Cardiology

## 2016-05-01 ENCOUNTER — Ambulatory Visit (INDEPENDENT_AMBULATORY_CARE_PROVIDER_SITE_OTHER): Payer: 59 | Admitting: Cardiology

## 2016-05-01 VITALS — BP 140/72 | HR 83 | Ht 65.0 in | Wt 191.0 lb

## 2016-05-01 DIAGNOSIS — R002 Palpitations: Secondary | ICD-10-CM

## 2016-05-01 DIAGNOSIS — R011 Cardiac murmur, unspecified: Secondary | ICD-10-CM

## 2016-05-01 DIAGNOSIS — I2510 Atherosclerotic heart disease of native coronary artery without angina pectoris: Secondary | ICD-10-CM

## 2016-05-01 DIAGNOSIS — I10 Essential (primary) hypertension: Secondary | ICD-10-CM

## 2016-05-01 DIAGNOSIS — E785 Hyperlipidemia, unspecified: Secondary | ICD-10-CM | POA: Diagnosis not present

## 2016-05-01 NOTE — Progress Notes (Signed)
Clinical Summary [name] is a 60 y.o.male seen today for follow up of the following medical problems.   1. Palpitations - previous monitor without significant arrhythmias - denies any recent symptmos   2. CAD - multiple stents placed as described below - last cath 2011: LM patent, LAD 95% at apex, patent stent D1, LCX occluded in midsection at prior stent, RCA non dominant with patent prox stent. LVEF 55-60%. - CABG 05/2010 LIMA-LAD, SVG-Dx, SVG-ramus SVG-PL.  - 04/2010 echo LVEF 55-60%, grade II diastolic dysfunction.   - denies any chest pain.No significant SOB or DOE - compliant with meds  3. HTN - checks at home 2 times a week. Typicallty 140s/70s - compliant with meds  4. Hyperlipidemia  - followed by Dr [name] and endocrinoloigist, reports has been at goal.       Past Medical History:  Diagnosis Date  . Abnormal LFTs   . Adenomatous colon polyp 2012  . Allergy    to Red Meat  . Anxiety   . Arteriosclerotic cardiovascular disease (ASCVD) 2003   Non-Q MI with stent to RCA in 12/2001, normal EF, additional stents to mid CX and D1; cath in 02/2005-progressive or eye disease, 50-70% mid LAD, 90% distal LAD, 50% D1, 60-70% CX, small PDA with diffuse disease, patent RCA stent; repeat cath in 01/2008-no significant progression; non-Q MI in 02/2010 with 100% mid Cx at  prior stent, nondom. RCA with patent stent, 50% RI, nl EF, CABG-04/2010  . Chronic kidney disease    Creatinine of 1.44 in 04/2009  . Chronic obstructive pulmonary disease ([place])   . Coronary artery disease    stent 2011  . Diabetes mellitus   . Erectile dysfunction   . Full dentures   . Gastroesophageal reflux disease   . Hepatic steatosis   . Hyperlipidemia    Predominantly elevated triglycerides  . Hypertension   . Internal hemorrhoids   . Myocardial infarction ([place]) 2002, 2011  . Neuropathy ([facility])    feet  . Pneumonia 2006   2006  . Tobacco abuse, in remission    35 pack years; discontinued  05/2010  . Wears glasses      Allergies  Allergen Reactions  . Beef-Derived Products Hives    RED MEAT  . Other Hives    Red meat  . Ranexa [Ranolazine] Rash  . Wellbutrin [Bupropion Hcl] Rash     Current Outpatient Prescriptions  Medication Sig Dispense Refill  . Alpha-Lipoic Acid 600 MG CAPS Take by mouth daily.    Marland Kitchen amLODipine (NORVASC) 10 MG tablet TAKE 1 TABLET BY MOUTH ONCE DAILY 90 tablet 4  . aspirin EC 81 MG tablet Take 81 mg by mouth daily.    Marland Kitchen atorvastatin (LIPITOR) 40 MG tablet Take 1 tablet (40 mg total) by mouth daily. 30 tablet 6  . diphenhydrAMINE (BENADRYL) 25 MG tablet Take 25 mg by mouth daily.    Marland Kitchen EPINEPHrine (EPI-PEN) 0.3 mg/0.3 mL SOAJ injection Inject 0.3 mg into the muscle as needed. Reported on 04/14/2016    . hydrOXYzine (ATARAX/VISTARIL) 25 MG tablet Take 1 or 2 po Q 6hrs prn itching or rash (Patient not taking: Reported on 11/12/2015) 60 tablet 0  . lisinopril-hydrochlorothiazide (PRINZIDE,ZESTORETIC) 10-12.5 MG tablet TAKE 1 TABLET BY MOUTH DAILY. 90 tablet 4  . metFORMIN (GLUCOPHAGE) 1000 MG tablet Take 1,000 mg by mouth 2 (two) times daily with a meal.      . MILK THISTLE EXTRACT PO Take 1,000 mg by mouth  daily.    . Multiple Vitamins-Minerals (MULTIVITAMIN WITH MINERALS) tablet Take 1 tablet by mouth daily.      Marland Kitchen oxyCODONE-acetaminophen (ROXICET) 5-325 MG tablet Take 1-2 tablets by mouth every 4 (four) hours as needed for severe pain. (Patient not taking: Reported on 03/31/2016) 60 tablet 0  . Probiotic Product (PROBIOTIC DAILY PO) Take 1 tablet by mouth daily.    . ranitidine (ZANTAC) 150 MG capsule Take 150 mg by mouth daily.    . TRESIBA FLEXTOUCH 200 UNIT/ML SOPN Inject 100 Units into the skin daily.  3   No current facility-administered medications for this visit.      Past Surgical History:  Procedure Laterality Date  . APPENDECTOMY  1980  . CATARACT EXTRACTION W/PHACO Left 09/21/2014   Procedure: CATARACT EXTRACTION PHACO AND INTRAOCULAR  LENS PLACEMENT (IOC);  Surgeon: [name] Lonni Dirden, MD;  Location: [facility];  Service: Ophthalmology;  Laterality: Left;  CDE:9.77  . COLONOSCOPY W/ POLYPECTOMY  2012   Dr. Olevia [name]; 2 polyps removed one of which was precancerous  . CORONARY ARTERY BYPASS GRAFT  05/2010   4 vessels August 2011  . EYE SURGERY    . HEMORRHOID SURGERY N/A 02/24/2014   Procedure: HEMORRHOIDECTOMY;  Surgeon: [name] Faster. Cornett, MD;  Location: [facility];  Service: General;  Laterality: N/A;  . SHOULDER ARTHROSCOPY WITH OPEN ROTATOR CUFF REPAIR AND DISTAL CLAVICLE ACROMINECTOMY Left 11/23/2015   Procedure: SHOULDER ARTHROSCOPY WITH MINI-OPEN ROTATOR CUFF REPAIR, DISTAL CLAVICLE RESECTION AND SUBACROMIAL DECOMPRESSION.;  Surgeon: [name] Balding, MD;  Location: [facility];  Service: Orthopedics;  Laterality: Left;  LEFT SHOULDER ARTHROSCOPIC SUBACROMIAL DECOMPRESSION, DISTAL CLAVICLE RESECTION, MINI-OPEN ROTATOR CUFF REPAIR.     Allergies  Allergen Reactions  . Beef-Derived Products Hives    RED MEAT  . Other Hives    Red meat  . Ranexa [Ranolazine] Rash  . Wellbutrin [Bupropion Hcl] Rash      Family History  Problem Relation Age of Onset  . Cirrhosis Mother   . Heart disease Father   . Pancreatic cancer Brother   . Cirrhosis Sister   . Colon cancer Neg Hx   . Esophageal cancer Neg Hx   . Rectal cancer Neg Hx   . Stomach cancer Neg Hx      Social History [name] reports that he has been smoking Cigarettes.  He started smoking about 42 years ago. He has a 15.00 pack-year smoking history. He has never used smokeless tobacco. [name] reports that he does not drink alcohol.   Review of Systems CONSTITUTIONAL: No weight loss, fever, chills, weakness or fatigue.  HEENT: Eyes: No visual loss, blurred vision, double vision or yellow sclerae.No hearing loss, sneezing, congestion, runny nose or sore throat.  SKIN: No rash or itching.  CARDIOVASCULAR: per HPI RESPIRATORY: No shortness of breath, cough  or sputum.  GASTROINTESTINAL: No anorexia, nausea, vomiting or diarrhea. No abdominal pain or blood.  GENITOURINARY: No burning on urination, no polyuria NEUROLOGICAL: No headache, dizziness, syncope, paralysis, ataxia, numbness or tingling in the extremities. No change in bowel or bladder control.  MUSCULOSKELETAL: No muscle, back pain, joint pain or stiffness.  LYMPHATICS: No enlarged nodes. No history of splenectomy.  PSYCHIATRIC: No history of depression or anxiety.  ENDOCRINOLOGIC: No reports of sweating, cold or heat intolerance. No polyuria or polydipsia.  Marland Kitchen   Physical Examination Vitals:   05/01/16 1623  BP: 140/72  Pulse: 83   Vitals:   05/01/16 1623  Weight: 191 lb (86.6 kg)  Height: 5\' 5"  (1.651 m)    Gen: resting comfortably, no acute distress HEENT: no scleral icterus, pupils equal round and reactive, no palptable cervical adenopathy,  CV: RRR, 2/6 systolic murmur RUSB, no jvd Resp: Clear to auscultation bilaterally GI: abdomen is soft, non-tender, non-distended, normal bowel sounds, no hepatosplenomegaly MSK: extremities are warm, no edema.  Skin: warm, no rash Neuro:  no focal deficits Psych: appropriate affect   Diagnostic Studies 2011 Cath Central aortic pressure 138/73 with a mean of 97. LV pressure 140/22 with an EDP of 24. There was no aortic stenosis.  Left main was normal. LAD was a large vessel coursed to the apex. It gave off a moderate-sized proximal diagonal. Throughout the LAD, there was mild diffuse disease with a focal 30% lesion in the midsection and a high-grade 95% stenosis at the apex. In the first diagonal, there was a previously placed stent, which was patent. There was minor luminal irregularities otherwise.  Left circumflex was a large dominant vessel, gave off a small-to- moderate ramus Tasharra Nodine, a small OM Prue Lingenfelter and then was totally occluded in the midsection within the previously placed stent. There was a 50% lesion in  the mid AV groove circumflex right before the stent. There was also a 50% lesion in the upper portion of the ramus Enijah Furr.  Right coronary artery was nondominant vessel and had a high anterior takeoff. There was a stent placed in the proximal portion, which was widely patent. There was an RV Marcey Persad, which was subtotally occluded. The vessel what appeared to be an atrial Dawna Jakes, which provided collaterals to the distal left circumflex system, primarily the left PDA. The distal right coronary once again was nondominant and small.  Left ventriculogram was done in the RAO position showed an EF of 55-60%. No regional wall motion abnormalities.  ASSESSMENT: 1. Three-vessel coronary artery disease as described above. 2. Reocclusion of the mid left circumflex drug-eluting stent with  right-to-left collaterals. 3. Normal left ventricular function.  PLAN: I have reviewed the catheterization films with Dr. Lia Foyer and Dr. Olevia [name]. Given that he has already two layers of drug-eluting stent, which he is re-occluded, likelihood that he would be able to keep this area open. After angioplasty, it would be quite low. We thus recommended titration of his medical therapy. He was previously on Viagra, but is no longer taking, so I will add Imdur 30 mg a day, may also be able to consider addition of Ranexa. Other possibilities would be possible single-vessel bypass surgery versus consideration of brachytherapy at [facility]. Dr. Lia Foyer will look into this. I will discuss with Dr. Lattie Haw.  04/2010 Cath Study Conclusions  - Left ventricle: The cavity size was normal. Wall thickness was  increased in a pattern of mild LVH. There was mild focal basal  hypertrophy of the septum. Systolic function was normal. The  estimated ejection fraction was in the range of 55% to 60%. There  appears to be mild mid posterior hypokinesis. Features are  consistent with a  pseudonormal left ventricular filling pattern,  with concomitant abnormal relaxation and increased filling  pressure (grade 2 diastolic dysfunction). - Aortic valve: There was no stenosis. - Mitral valve: Mild regurgitation. - Left atrium: The atrium was mildly dilated. - Right ventricle: The cavity size was normal. Systolic function was  normal. - Pulmonary arteries: No complete TR doppler jet was measured so  unable to estimate PA systolic pressure. - Inferior vena cava: The vessel was normal in size; the  respirophasic  diameter changes were in the normal range (= 50%);  findings are consistent with normal central venous pressure. Impressions:  - Normal LV size with mild LV hypertrophy. EF 55-60% with mild mid  posterior hypokinesis.Normal RV size and systolic function. Mild  mitral regurgitation. Transthoracic echocardiography. M-mode, complete 2D, spectral Doppler, and color Doppler.   02/03/14 Clinic EKG NSR, 1st degree AV block    Assessment and Plan  1. Palpitations - EKG in clinic shows sinus tach at 100 - will check BMET, Mg, and TSH - obtain 7 day event monitor - will give metoprolol 25mg  bid prn palpitations only    1. CAD - no recent symptoms, continue current meds  2. HTN - above goal based on history of DM2. He will submit bp log in 1 week, if home bp's above 130/80 will increase his prinzide  3. Palpitations - resolved with decreased caffeine, continue to monitor. .   4. Hyperlipidemia - request labs from pcp - continue statin  5. Heart murmur - obtain echo  F/u 1 year      Arnoldo Lenis, M.D., F.A.C.C.

## 2016-05-01 NOTE — Patient Instructions (Signed)
Medication Instructions:  Your physician recommends that you continue on your current medications as directed. Please refer to the Current Medication list given to you today.   Labwork: I WILL REQUEST LABS FROM DR. KERR  Testing/Procedures: Your physician has requested that you have an echocardiogram. Echocardiography is a painless test that uses sound waves to create images of your heart. It provides your doctor with information about the size and shape of your heart and how well your heart's chambers and valves are working. This procedure takes approximately one hour. There are no restrictions for this procedure.    Follow-Up: Your physician wants you to follow-up in: 1 YEAR.  You will receive a reminder letter in the mail two months in advance. If you don't receive a letter, please call our office to schedule the follow-up appointment.   Any Other Special Instructions Will Be Listed Below (If Applicable).     If you need a refill on your cardiac medications before your next appointment, please call your pharmacy.

## 2016-05-10 DIAGNOSIS — N400 Enlarged prostate without lower urinary tract symptoms: Secondary | ICD-10-CM | POA: Diagnosis not present

## 2016-05-10 DIAGNOSIS — N5201 Erectile dysfunction due to arterial insufficiency: Secondary | ICD-10-CM | POA: Diagnosis not present

## 2016-05-11 ENCOUNTER — Ambulatory Visit (HOSPITAL_COMMUNITY)
Admission: RE | Admit: 2016-05-11 | Discharge: 2016-05-11 | Disposition: A | Discharge status: Home / Self Care | Payer: 59 | Source: Ambulatory Visit | Attending: Cardiology | Admitting: Cardiology

## 2016-05-11 DIAGNOSIS — I071 Rheumatic tricuspid insufficiency: Secondary | ICD-10-CM | POA: Insufficient documentation

## 2016-05-11 DIAGNOSIS — K219 Gastro-esophageal reflux disease without esophagitis: Secondary | ICD-10-CM | POA: Diagnosis not present

## 2016-05-11 DIAGNOSIS — E785 Hyperlipidemia, unspecified: Secondary | ICD-10-CM | POA: Diagnosis not present

## 2016-05-11 DIAGNOSIS — R011 Cardiac murmur, unspecified: Secondary | ICD-10-CM | POA: Insufficient documentation

## 2016-05-11 DIAGNOSIS — I119 Hypertensive heart disease without heart failure: Secondary | ICD-10-CM | POA: Diagnosis not present

## 2016-05-11 DIAGNOSIS — I340 Nonrheumatic mitral (valve) insufficiency: Secondary | ICD-10-CM | POA: Diagnosis not present

## 2016-05-11 DIAGNOSIS — Z87891 Personal history of nicotine dependence: Secondary | ICD-10-CM | POA: Diagnosis not present

## 2016-05-11 DIAGNOSIS — E119 Type 2 diabetes mellitus without complications: Secondary | ICD-10-CM | POA: Insufficient documentation

## 2016-05-11 DIAGNOSIS — I358 Other nonrheumatic aortic valve disorders: Secondary | ICD-10-CM | POA: Diagnosis not present

## 2016-05-11 NOTE — Progress Notes (Signed)
*  PRELIMINARY RESULTS* Echocardiogram 2D Echocardiogram has been performed.  [name] 05/11/2016, 9:12 AM

## 2016-05-12 ENCOUNTER — Telehealth: Payer: Self-pay

## 2016-05-12 NOTE — Telephone Encounter (Signed)
-----   Message from Arnoldo Lenis, MD sent at 05/11/2016 12:56 PM EDT ----- Overall heart looks good. He has one heart valve that is a little thickened which creates the murmur, but overall is working well. One other valve is just mildly leaky, but nothing of concern  Zandra Abts MD

## 2016-05-12 NOTE — Telephone Encounter (Signed)
Called pt, left vm to return call.

## 2016-05-29 MED FILL — TRESIBA FLEXTOUCH 200 UNITS: 200 | 30 days supply | Qty: 18 | Fill #5

## 2016-05-31 ENCOUNTER — Other Ambulatory Visit: Payer: Self-pay | Admitting: *Deleted

## 2016-05-31 MED ORDER — ATORVASTATIN CALCIUM 40 MG PO TABS: 40.0000 mg | ORAL_TABLET | Freq: Every day | ORAL | 3 refills | Status: DC

## 2016-06-02 DIAGNOSIS — N5201 Erectile dysfunction due to arterial insufficiency: Secondary | ICD-10-CM | POA: Diagnosis not present

## 2016-06-19 ENCOUNTER — Other Ambulatory Visit: Payer: Self-pay | Admitting: Cardiovascular Disease

## 2016-06-19 NOTE — Telephone Encounter (Signed)
Please all [facility] pharmacy regarding patient's Lipitor RX. Number is [phone]. / tg

## 2016-06-20 DIAGNOSIS — L570 Actinic keratosis: Secondary | ICD-10-CM | POA: Diagnosis not present

## 2016-06-20 DIAGNOSIS — Z85828 Personal history of other malignant neoplasm of skin: Secondary | ICD-10-CM | POA: Diagnosis not present

## 2016-06-20 MED ORDER — ATORVASTATIN CALCIUM 40 MG PO TABS: 40.0000 mg | ORAL_TABLET | Freq: Every day | ORAL | 3 refills | Status: DC

## 2016-06-20 MED FILL — ATORVASTATIN 40 MG TABLET: 40 | 90 days supply | Qty: 90 | Fill #0

## 2016-06-20 NOTE — Telephone Encounter (Signed)
Refilled lipitor 

## 2016-07-11 DIAGNOSIS — Z961 Presence of intraocular lens: Secondary | ICD-10-CM | POA: Diagnosis not present

## 2016-07-11 DIAGNOSIS — H52223 Regular astigmatism, bilateral: Secondary | ICD-10-CM | POA: Diagnosis not present

## 2016-07-11 DIAGNOSIS — H524 Presbyopia: Secondary | ICD-10-CM | POA: Diagnosis not present

## 2016-07-11 DIAGNOSIS — H5201 Hypermetropia, right eye: Secondary | ICD-10-CM | POA: Diagnosis not present

## 2016-07-17 MED FILL — AMLODIPINE BESYLATE 10 MG T: 10 | 90 days supply | Qty: 90 | Fill #1

## 2016-07-17 MED FILL — LISINOPRIL-HCTZ 10-12.5 MG: 10-12.5 | 90 days supply | Qty: 90 | Fill #1

## 2016-07-17 MED FILL — metFORMIN HCL 1000 MG TABS: 1000 | 90 days supply | Qty: 180 | Fill #1

## 2016-08-02 MED FILL — TRESIBA FLEXTOUCH 200 UNITS: 200 | 30 days supply | Qty: 18 | Fill #6

## 2016-08-21 DIAGNOSIS — H43393 Other vitreous opacities, bilateral: Secondary | ICD-10-CM | POA: Diagnosis not present

## 2016-08-21 DIAGNOSIS — H04123 Dry eye syndrome of bilateral lacrimal glands: Secondary | ICD-10-CM | POA: Diagnosis not present

## 2016-08-21 DIAGNOSIS — H43813 Vitreous degeneration, bilateral: Secondary | ICD-10-CM | POA: Diagnosis not present

## 2016-08-21 DIAGNOSIS — Z961 Presence of intraocular lens: Secondary | ICD-10-CM | POA: Diagnosis not present

## 2016-09-04 DIAGNOSIS — N400 Enlarged prostate without lower urinary tract symptoms: Secondary | ICD-10-CM | POA: Diagnosis not present

## 2016-09-04 DIAGNOSIS — N5201 Erectile dysfunction due to arterial insufficiency: Secondary | ICD-10-CM | POA: Diagnosis not present

## 2016-09-18 MED FILL — ATORVASTATIN 40 MG TABLET: 40 | 90 days supply | Qty: 90 | Fill #1

## 2016-09-27 MED FILL — TRESIBA FLEXTOUCH 200 UNITS: 200 | 30 days supply | Qty: 18 | Fill #0

## 2016-11-06 MED FILL — LISINOPRIL-HCTZ 10-12.5 MG: 10-12.5 | 90 days supply | Qty: 90 | Fill #2

## 2016-11-06 MED FILL — metFORMIN HCL 1000 MG TABS: 1000 | 90 days supply | Qty: 180 | Fill #2

## 2016-11-06 MED FILL — AMLODIPINE BESYLATE 10 MG T: 10 | 90 days supply | Qty: 90 | Fill #2

## 2016-12-04 MED FILL — TRESIBA FLEXTOUCH 200 UNITS: 200 | 30 days supply | Qty: 18 | Fill #1

## 2016-12-19 DIAGNOSIS — Z6829 Body mass index (BMI) 29.0-29.9, adult: Secondary | ICD-10-CM | POA: Diagnosis not present

## 2016-12-19 DIAGNOSIS — F5101 Primary insomnia: Secondary | ICD-10-CM | POA: Diagnosis not present

## 2016-12-19 DIAGNOSIS — E1165 Type 2 diabetes mellitus with hyperglycemia: Secondary | ICD-10-CM | POA: Diagnosis not present

## 2016-12-19 DIAGNOSIS — F419 Anxiety disorder, unspecified: Secondary | ICD-10-CM | POA: Diagnosis not present

## 2016-12-19 DIAGNOSIS — I10 Essential (primary) hypertension: Secondary | ICD-10-CM | POA: Diagnosis not present

## 2016-12-26 MED FILL — ATORVASTATIN 40 MG TABLET: 40 | 90 days supply | Qty: 90 | Fill #2

## 2017-01-17 DIAGNOSIS — E1122 Type 2 diabetes mellitus with diabetic chronic kidney disease: Secondary | ICD-10-CM | POA: Diagnosis not present

## 2017-01-17 DIAGNOSIS — Z5181 Encounter for therapeutic drug level monitoring: Secondary | ICD-10-CM | POA: Diagnosis not present

## 2017-01-17 DIAGNOSIS — K7581 Nonalcoholic steatohepatitis (NASH): Secondary | ICD-10-CM | POA: Diagnosis not present

## 2017-01-17 DIAGNOSIS — Z7984 Long term (current) use of oral hypoglycemic drugs: Secondary | ICD-10-CM | POA: Diagnosis not present

## 2017-01-17 DIAGNOSIS — I2510 Atherosclerotic heart disease of native coronary artery without angina pectoris: Secondary | ICD-10-CM | POA: Diagnosis not present

## 2017-01-17 DIAGNOSIS — N529 Male erectile dysfunction, unspecified: Secondary | ICD-10-CM | POA: Diagnosis not present

## 2017-01-17 DIAGNOSIS — Z794 Long term (current) use of insulin: Secondary | ICD-10-CM | POA: Diagnosis not present

## 2017-01-17 DIAGNOSIS — Z79899 Other long term (current) drug therapy: Secondary | ICD-10-CM | POA: Diagnosis not present

## 2017-01-17 DIAGNOSIS — E1142 Type 2 diabetes mellitus with diabetic polyneuropathy: Secondary | ICD-10-CM | POA: Diagnosis not present

## 2017-01-31 MED FILL — TRESIBA FLEXTOUCH 200 UNITS: 200 | 90 days supply | Qty: 36 | Fill #0

## 2017-02-07 MED FILL — metFORMIN HCL 1000 MG TABS: 1000 | 90 days supply | Qty: 180 | Fill #0

## 2017-02-07 MED FILL — LISINOPRIL-HCTZ 10-12.5 MG: 10-12.5 | 90 days supply | Qty: 90 | Fill #3

## 2017-02-07 MED FILL — AMLODIPINE BESYLATE 10 MG T: 10 | 90 days supply | Qty: 90 | Fill #3

## 2017-02-13 MED FILL — HUMALOG 100 UNITS/ML KWIKPE: 100 | 90 days supply | Qty: 9 | Fill #0

## 2017-02-20 DIAGNOSIS — M65871 Other synovitis and tenosynovitis, right ankle and foot: Secondary | ICD-10-CM | POA: Diagnosis not present

## 2017-02-20 DIAGNOSIS — M792 Neuralgia and neuritis, unspecified: Secondary | ICD-10-CM | POA: Diagnosis not present

## 2017-02-20 DIAGNOSIS — M722 Plantar fascial fibromatosis: Secondary | ICD-10-CM | POA: Diagnosis not present

## 2017-03-26 MED FILL — ATORVASTATIN 40 MG TABLET: 40 | 90 days supply | Qty: 90 | Fill #3

## 2017-05-04 MED FILL — HUMALOG 100 UNITS/ML KWIKPE: 100 | 90 days supply | Qty: 9 | Fill #1

## 2017-05-14 ENCOUNTER — Other Ambulatory Visit: Payer: Self-pay | Admitting: Cardiology

## 2017-05-14 MED FILL — metFORMIN HCL 1000 MG TABS: 1000 | 90 days supply | Qty: 180 | Fill #1

## 2017-05-14 MED FILL — LISINOPRIL-HCTZ 10-12.5 MG: 10-12.5 | 90 days supply | Qty: 90 | Fill #0

## 2017-05-14 MED FILL — AMLODIPINE BESYLATE 10 MG T: 10 | 90 days supply | Qty: 90 | Fill #0

## 2017-06-07 ENCOUNTER — Ambulatory Visit (INDEPENDENT_AMBULATORY_CARE_PROVIDER_SITE_OTHER): Payer: 59

## 2017-06-07 ENCOUNTER — Encounter: Payer: Self-pay | Admitting: Podiatry

## 2017-06-07 ENCOUNTER — Ambulatory Visit (INDEPENDENT_AMBULATORY_CARE_PROVIDER_SITE_OTHER): Payer: 59 | Admitting: Podiatry

## 2017-06-07 VITALS — BP 134/62 | Resp 18

## 2017-06-07 DIAGNOSIS — E119 Type 2 diabetes mellitus without complications: Secondary | ICD-10-CM | POA: Diagnosis not present

## 2017-06-07 DIAGNOSIS — M722 Plantar fascial fibromatosis: Secondary | ICD-10-CM

## 2017-06-07 DIAGNOSIS — M79671 Pain in right foot: Secondary | ICD-10-CM

## 2017-06-07 DIAGNOSIS — Z0189 Encounter for other specified special examinations: Secondary | ICD-10-CM

## 2017-06-07 NOTE — Patient Instructions (Signed)
Plantar Fasciitis Plantar fasciitis is a painful foot condition that affects the heel. It occurs when the band of tissue that connects the toes to the heel bone (plantar fascia) becomes irritated. This can happen after exercising too much or doing other repetitive activities (overuse injury). The pain from plantar fasciitis can range from mild irritation to severe pain that makes it difficult for you to walk or move. The pain is usually worse in the morning or after you have been sitting or lying down for a while. What are the causes? This condition may be caused by:  Standing for long periods of time.  Wearing shoes that do not fit.  Doing high-impact activities, including running, aerobics, and ballet.  Being overweight.  Having an abnormal way of walking (gait).  Having tight calf muscles.  Having high arches in your feet.  Starting a new athletic activity.  What are the signs or symptoms? The main symptom of this condition is heel pain. Other symptoms include:  Pain that gets worse after activity or exercise.  Pain that is worse in the morning or after resting.  Pain that goes away after you walk for a few minutes.  How is this diagnosed? This condition may be diagnosed based on your signs and symptoms. Your health care provider will also do a physical exam to check for:  A tender area on the bottom of your foot.  A high arch in your foot.  Pain when you move your foot.  Difficulty moving your foot.  You may also need to have imaging studies to confirm the diagnosis. These can include:  X-rays.  Ultrasound.  MRI.  How is this treated? Treatment for plantar fasciitis depends on the severity of the condition. Your treatment may include:  Rest, ice, and over-the-counter pain medicines to manage your pain.  Exercises to stretch your calves and your plantar fascia.  A splint that holds your foot in a stretched, upward position while you sleep (night  splint).  Physical therapy to relieve symptoms and prevent problems in the future.  Cortisone injections to relieve severe pain.  Extracorporeal shock wave therapy (ESWT) to stimulate damaged plantar fascia with electrical impulses. It is often used as a last resort before surgery.  Surgery, if other treatments have not worked after 12 months.  Follow these instructions at home:  Take medicines only as directed by your health care provider.  Avoid activities that cause pain.  Roll the bottom of your foot over a bag of ice or a bottle of cold water. Do this for 20 minutes, 3-4 times a day.  Perform simple stretches as directed by your health care provider.  Try wearing athletic shoes with air-sole or gel-sole cushions or soft shoe inserts.  Wear a night splint while sleeping, if directed by your health care provider.  Keep all follow-up appointments with your health care provider. How is this prevented?  Do not perform exercises or activities that cause heel pain.  Consider finding low-impact activities if you continue to have problems.  Lose weight if you need to. The best way to prevent plantar fasciitis is to avoid the activities that aggravate your plantar fascia. Contact a health care provider if:  Your symptoms do not go away after treatment with home care measures.  Your pain gets worse.  Your pain affects your ability to move or do your daily activities. This information is not intended to replace advice given to you by your health care provider. Make sure you   discuss any questions you have with your health care provider. Document Released: 06/20/2001 Document Revised: 02/28/2016 Document Reviewed: 08/05/2014 Elsevier Interactive Patient Education  2018 [employer]  Diabetes and Foot Care Diabetes may cause you to have problems because of poor blood supply (circulation) to your feet and legs. This may cause the skin on your feet to become thinner, break easier,  and heal more slowly. Your skin may become dry, and the skin may peel and crack. You may also have nerve damage in your legs and feet causing decreased feeling in them. You may not notice minor injuries to your feet that could lead to infections or more serious problems. Taking care of your feet is one of the most important things you can do for yourself. Follow these instructions at home:  Wear shoes at all times, even in the house. Do not go barefoot. Bare feet are easily injured.  Check your feet daily for blisters, cuts, and redness. If you cannot see the bottom of your feet, use a mirror or ask someone for help.  Wash your feet with warm water (do not use hot water) and mild soap. Then pat your feet and the areas between your toes until they are completely dry. Do not soak your feet as this can dry your skin.  Apply a moisturizing lotion or petroleum jelly (that does not contain alcohol and is unscented) to the skin on your feet and to dry, brittle toenails. Do not apply lotion between your toes.  Trim your toenails straight across. Do not dig under them or around the cuticle. File the edges of your nails with an emery board or nail file.  Do not cut corns or calluses or try to remove them with medicine.  Wear clean socks or stockings every day. Make sure they are not too tight. Do not wear knee-high stockings since they may decrease blood flow to your legs.  Wear shoes that fit properly and have enough cushioning. To break in new shoes, wear them for just a few hours a day. This prevents you from injuring your feet. Always look in your shoes before you put them on to be sure there are no objects inside.  Do not cross your legs. This may decrease the blood flow to your feet.  If you find a minor scrape, cut, or break in the skin on your feet, keep it and the skin around it clean and dry. These areas may be cleansed with mild soap and water. Do not cleanse the area with peroxide, alcohol, or  iodine.  When you remove an adhesive bandage, be sure not to damage the skin around it.  If you have a wound, look at it several times a day to make sure it is healing.  Do not use heating pads or hot water bottles. They may burn your skin. If you have lost feeling in your feet or legs, you may not know it is happening until it is too late.  Make sure your health care provider performs a complete foot exam at least annually or more often if you have foot problems. Report any cuts, sores, or bruises to your health care provider immediately. Contact a health care provider if:  You have an injury that is not healing.  You have cuts or breaks in the skin.  You have an ingrown nail.  You notice redness on your legs or feet.  You feel burning or tingling in your legs or feet.  You have pain   or cramps in your legs and feet.  Your legs or feet are numb.  Your feet always feel cold. Get help right away if:  There is increasing redness, swelling, or pain in or around a wound.  There is a red line that goes up your leg.  Pus is coming from a wound.  You develop a fever or as directed by your health care provider.  You notice a bad smell coming from an ulcer or wound. This information is not intended to replace advice given to you by your health care provider. Make sure you discuss any questions you have with your health care provider. Document Released: 09/22/2000 Document Revised: 03/02/2016 Document Reviewed: 03/04/2013 Elsevier Interactive Patient Education  2017 [employer].  

## 2017-06-07 NOTE — Progress Notes (Signed)
Subjective:    Patient ID: [name], male    DOB: October 26, 1955, 61 y.o.   MRN: [id number]  HPI  Chief Complaint  Patient presents with  . Foot Pain    Right, bottom of heel x 1.5 weeks    61 y.o. male presents with the above complaint. Reports pain to the bottom of R heel for 1.5 weeks. Reports pain worst in the AM and after periods of rest. Reports pain as sharp. Never had this issue before. Denies prior treatment. Patient is a Doctor, hospital.  61 y.o. male presents with the above complaint.  Past Medical History:  Diagnosis Date  . Abnormal LFTs   . Adenomatous colon polyp 2012  . Allergy    to Red Meat  . Anxiety   . Arteriosclerotic cardiovascular disease (ASCVD) 2003   Non-Q MI with stent to RCA in 12/2001, normal EF, additional stents to mid CX and D1; cath in 02/2005-progressive or eye disease, 50-70% mid LAD, 90% distal LAD, 50% D1, 60-70% CX, small PDA with diffuse disease, patent RCA stent; repeat cath in 01/2008-no significant progression; non-Q MI in 02/2010 with 100% mid Cx at  prior stent, nondom. RCA with patent stent, 50% RI, nl EF, CABG-04/2010  . Chronic kidney disease    Creatinine of 1.44 in 04/2009  . Chronic obstructive pulmonary disease ([place])   . Coronary artery disease    stent 2011  . Diabetes mellitus   . Erectile dysfunction   . Full dentures   . Gastroesophageal reflux disease   . Hepatic steatosis   . Hyperlipidemia    Predominantly elevated triglycerides  . Hypertension   . Internal hemorrhoids   . Myocardial infarction ([place]) 2002, 2011  . Neuropathy    feet  . Pneumonia 2006   2006  . Tobacco abuse, in remission    35 pack years; discontinued 05/2010  . Wears glasses    Past Surgical History:  Procedure Laterality Date  . APPENDECTOMY  1980  . CATARACT EXTRACTION W/PHACO Left 09/21/2014   Procedure: CATARACT EXTRACTION PHACO AND INTRAOCULAR LENS PLACEMENT (IOC);  Surgeon: Tonny Branch, MD;  Location: [facility];   Service: Ophthalmology;  Laterality: Left;  CDE:9.77  . COLONOSCOPY W/ POLYPECTOMY  2012   Dr. Olevia Perches; 2 polyps removed one of which was precancerous  . CORONARY ARTERY BYPASS GRAFT  05/2010   4 vessels August 2011  . EYE SURGERY    . HEMORRHOID SURGERY N/A 02/24/2014   Procedure: HEMORRHOIDECTOMY;  Surgeon: Joyice Faster. Cornett, MD;  Location: [facility];  Service: General;  Laterality: N/A;  . SHOULDER ARTHROSCOPY WITH OPEN ROTATOR CUFF REPAIR AND DISTAL CLAVICLE ACROMINECTOMY Left 11/23/2015   Procedure: SHOULDER ARTHROSCOPY WITH MINI-OPEN ROTATOR CUFF REPAIR, DISTAL CLAVICLE RESECTION AND SUBACROMIAL DECOMPRESSION.;  Surgeon: Garald Balding, MD;  Location: [facility];  Service: Orthopedics;  Laterality: Left;  LEFT SHOULDER ARTHROSCOPIC SUBACROMIAL DECOMPRESSION, DISTAL CLAVICLE RESECTION, MINI-OPEN ROTATOR CUFF REPAIR.    Current Outpatient Prescriptions:  .  Alpha-Lipoic Acid 600 MG CAPS, Take by mouth daily., Disp: , Rfl:  .  amLODipine (NORVASC) 10 MG tablet, TAKE 1 TABLET BY MOUTH ONCE DAILY, Disp: 90 tablet, Rfl: 0 .  aspirin EC 81 MG tablet, Take 81 mg by mouth daily., Disp: , Rfl:  .  atorvastatin (LIPITOR) 40 MG tablet, Take 1 tablet (40 mg total) by mouth daily at 6 PM., Disp: 90 tablet, Rfl: 3 .  diphenhydrAMINE (BENADRYL) 25 MG tablet, Take 25  mg by mouth daily., Disp: , Rfl:  .  HUMALOG KWIKPEN 100 UNIT/ML KiwkPen, INJECT 10 UNITS UNDER THE SKIN AT THE BEGINNING OF EVENING MEAL ONCE DAILY, Disp: , Rfl: 4 .  lisinopril-hydrochlorothiazide (PRINZIDE,ZESTORETIC) 10-12.5 MG tablet, TAKE 1 TABLET BY MOUTH DAILY., Disp: 90 tablet, Rfl: 0 .  metFORMIN (GLUCOPHAGE) 1000 MG tablet, Take 1,000 mg by mouth 2 (two) times daily with a meal.  , Disp: , Rfl:  .  Multiple Vitamins-Minerals (MULTIVITAMIN WITH MINERALS) tablet, Take 1 tablet by mouth daily.  , Disp: , Rfl:  .  Probiotic Product (PROBIOTIC DAILY PO), Take 1 tablet by mouth daily., Disp: , Rfl:  .  TRESIBA FLEXTOUCH 200  UNIT/ML SOPN, Inject 100 Units into the skin daily., Disp: , Rfl: 3 .  EPINEPHrine (EPI-PEN) 0.3 mg/0.3 mL SOAJ injection, Inject 0.3 mg into the muscle as needed. Reported on 04/14/2016, Disp: , Rfl:   Allergies  Allergen Reactions  . Beef-Derived Products Hives    RED MEAT  . Other Hives    Red meat  . Ranexa [Ranolazine] Rash  . Wellbutrin [Bupropion Hcl] Rash       Review of Systems  HENT:       Trouble swallowing  Gastrointestinal: Positive for abdominal distention, constipation, diarrhea and nausea.  Allergic/Immunologic: Positive for food allergies.  Hematological: Bruises/bleeds easily.  All other systems reviewed and are negative.      Objective:   Physical Exam Vitals:   06/07/17 1414  BP: 134/62  Resp: 18   General AA&O x3. Normal mood and affect.  Vascular Dorsalis pedis and posterior tibial pulses  present 2+ right Capillary refill normal to all digits. Pedal hair growth normal.  Neurologic Epicritic sensation grossly present right. Vibratory sensation intact right. SWMF 5/5 Right.  Dermatologic No open lesions. Interspaces clear of maceration. Nails well groomed and normal in appearance.  Orthopedic: MMT 5/5 in dorsiflexion, plantarflexion, inversion, and eversion right. Tender to palpation at the calcaneal tuber right. No pain with calcaneal squeeze right. Ankle ROM diminished range of motion right. Silfverskiold Test: positive right.   Radiographs: Taken and reviewed. No acute fractures. No evidence of calcaneal stress fracture. Posterior ankle spurring noted. Slight plantar spur.    Assessment & Plan:  Plantar Fasciitis, right - XR reviewed as above.  - Educated on icing and stretching. Instructions given.  - Injection delivered as below. - Night splint dispensed.  Procedure: Injection Tendon/Ligament Location: Right plantar fascia at the glabrous junction; medial approach. Skin Prep: Alcohol. Injectate: 1 cc 0.5% marcaine plain, 1cc  dexamethasone phosphate, 0.5 cc kenalog 10. Disposition: Patient tolerated procedure well. Injection site dressed with a band-aid.

## 2017-06-13 ENCOUNTER — Other Ambulatory Visit: Payer: Self-pay | Admitting: Podiatry

## 2017-06-13 DIAGNOSIS — M722 Plantar fascial fibromatosis: Secondary | ICD-10-CM

## 2017-06-19 DIAGNOSIS — L570 Actinic keratosis: Secondary | ICD-10-CM | POA: Diagnosis not present

## 2017-06-19 DIAGNOSIS — L821 Other seborrheic keratosis: Secondary | ICD-10-CM | POA: Diagnosis not present

## 2017-06-19 DIAGNOSIS — L280 Lichen simplex chronicus: Secondary | ICD-10-CM | POA: Diagnosis not present

## 2017-06-19 MED FILL — TRESIBA FLEXTOUCH 200 UNITS: 200 | 90 days supply | Qty: 36 | Fill #1

## 2017-06-26 DIAGNOSIS — R509 Fever, unspecified: Secondary | ICD-10-CM | POA: Diagnosis not present

## 2017-06-26 DIAGNOSIS — J209 Acute bronchitis, unspecified: Secondary | ICD-10-CM | POA: Diagnosis not present

## 2017-06-26 DIAGNOSIS — R05 Cough: Secondary | ICD-10-CM | POA: Diagnosis not present

## 2017-06-28 ENCOUNTER — Telehealth: Payer: 59 | Admitting: Physician Assistant

## 2017-06-28 DIAGNOSIS — R0689 Other abnormalities of breathing: Secondary | ICD-10-CM

## 2017-06-28 NOTE — Progress Notes (Signed)
Based on what you shared with me it looks like you have a serious condition that should be evaluated in a face to face office visit. Giving the rattling in your chest along with cough and difficulty breathing, you need an in-person assessment to make sure there is no concern for a pneumonia.  NOTE: Even if you have entered your credit card information for this eVisit, you will not be charged.   If you are having a true medical emergency please call 911.  If you need an urgent face to face visit, [facility] has four urgent care centers for your convenience.  If you need care fast and have a high deductible or no insurance consider:   [email or web address]  [phone]  [address], [address] [place], [place] [address] 8 am to 8 pm Monday-Friday 10 am to 4 pm Saturday-Sunday   The following sites will take your  insurance:    . [facility] [place] a Provider at this Location  [address] [place], [place] [address] . 10 am to 8 pm Monday-Friday . 12 pm to 8 pm Saturday-Sunday   . [facility] at [place] a Provider at this Location  [address], [place], [place] [address] . 8 am to 8 pm Monday-Friday . 9 am to 6 pm Saturday . 11 am to 6 pm Sunday   . [facility] at [place] Get Driving Directions  [address] [place], [place] [address] . 8 am to 8 pm Monday-Friday . 8 am to 4 pm Saturday-Sunday   Your e-visit answers were reviewed by a board certified advanced clinical practitioner to complete your personal care plan.  Thank you for using e-Visits.

## 2017-07-10 ENCOUNTER — Other Ambulatory Visit: Payer: Self-pay | Admitting: Cardiology

## 2017-07-10 MED FILL — ATORVASTATIN 40 MG TABLET: 40 | 30 days supply | Qty: 30 | Fill #0

## 2017-07-12 ENCOUNTER — Ambulatory Visit (INDEPENDENT_AMBULATORY_CARE_PROVIDER_SITE_OTHER): Payer: 59 | Admitting: Podiatry

## 2017-07-12 DIAGNOSIS — M722 Plantar fascial fibromatosis: Secondary | ICD-10-CM

## 2017-07-13 NOTE — Progress Notes (Signed)
  Subjective:  Patient ID: [name], male    DOB: 1955-10-27,  MRN: [id number]  Chief Complaint  Patient presents with  . Foot Pain    right foot/heel pain back x 1 week   61 y.o. male returns for the above complaint. States that he had relief of his pain for 3 weeks. Stasis difficulty using his night splint for the entirety of the night. States the pain first came back about a week ago. No new complaints.   Objective:   General AA&O x3. Normal mood and affect.  Vascular Dorsalis pedis and posterior tibial pulses  present 2+ right Capillary refill normal to all digits. Pedal hair growth normal.  Neurologic Epicritic sensation grossly present right.  Dermatologic No open lesions. Interspaces clear of maceration. Nails well groomed and normal in appearance.  Orthopedic: Tender to palpation at the calcaneal tuber right. No pain with calcaneal squeeze right. Ankle ROM diminished range of motion right. Silfverskiold Test: positive right.   Assessment & Plan:  Patient was evaluated and treated and all questions answered.  Plantar fasciitis right -Repeat injection as below -Continue stretching. Continue night splint. Advised he only needs to use night splint for 30 minutes a night.  Procedure: Injection Tendon/Ligament Location: Right plantar fascia at the glabrous junction; medial approach. Skin Prep: Alcohol. Injectate: 1 cc 0.5% marcaine plain, 1 cc dexamethasone phosphate, 0.5 cc kenalog 10. Disposition: Patient tolerated procedure well. Injection site dressed with a band-aid.  Return in about 4 weeks (around 08/09/2017).

## 2017-07-24 DIAGNOSIS — I10 Essential (primary) hypertension: Secondary | ICD-10-CM | POA: Diagnosis not present

## 2017-07-24 DIAGNOSIS — E1165 Type 2 diabetes mellitus with hyperglycemia: Secondary | ICD-10-CM | POA: Diagnosis not present

## 2017-07-26 DIAGNOSIS — F419 Anxiety disorder, unspecified: Secondary | ICD-10-CM | POA: Diagnosis not present

## 2017-07-26 DIAGNOSIS — E782 Mixed hyperlipidemia: Secondary | ICD-10-CM | POA: Diagnosis not present

## 2017-07-26 DIAGNOSIS — K7581 Nonalcoholic steatohepatitis (NASH): Secondary | ICD-10-CM | POA: Diagnosis not present

## 2017-07-26 DIAGNOSIS — I2510 Atherosclerotic heart disease of native coronary artery without angina pectoris: Secondary | ICD-10-CM | POA: Diagnosis not present

## 2017-07-26 DIAGNOSIS — D649 Anemia, unspecified: Secondary | ICD-10-CM | POA: Diagnosis not present

## 2017-07-26 DIAGNOSIS — I10 Essential (primary) hypertension: Secondary | ICD-10-CM | POA: Diagnosis not present

## 2017-07-26 DIAGNOSIS — E1142 Type 2 diabetes mellitus with diabetic polyneuropathy: Secondary | ICD-10-CM | POA: Diagnosis not present

## 2017-07-26 DIAGNOSIS — F17210 Nicotine dependence, cigarettes, uncomplicated: Secondary | ICD-10-CM | POA: Diagnosis not present

## 2017-07-26 DIAGNOSIS — K760 Fatty (change of) liver, not elsewhere classified: Secondary | ICD-10-CM | POA: Diagnosis not present

## 2017-08-08 MED FILL — HUMALOG 100 UNITS/ML KWIKPE: 100 | 90 days supply | Qty: 9 | Fill #2

## 2017-08-09 ENCOUNTER — Ambulatory Visit: Payer: 59 | Admitting: Podiatry

## 2017-08-10 DIAGNOSIS — H5203 Hypermetropia, bilateral: Secondary | ICD-10-CM | POA: Diagnosis not present

## 2017-08-10 DIAGNOSIS — H52223 Regular astigmatism, bilateral: Secondary | ICD-10-CM | POA: Diagnosis not present

## 2017-08-10 DIAGNOSIS — H524 Presbyopia: Secondary | ICD-10-CM | POA: Diagnosis not present

## 2017-08-14 ENCOUNTER — Encounter: Payer: Self-pay | Admitting: Cardiology

## 2017-08-14 ENCOUNTER — Ambulatory Visit: Payer: 59 | Admitting: Cardiology

## 2017-08-14 ENCOUNTER — Encounter: Payer: Self-pay | Admitting: *Deleted

## 2017-08-14 VITALS — BP 140/70 | HR 84 | Ht 67.0 in | Wt 179.0 lb

## 2017-08-14 DIAGNOSIS — R002 Palpitations: Secondary | ICD-10-CM | POA: Diagnosis not present

## 2017-08-14 DIAGNOSIS — I2510 Atherosclerotic heart disease of native coronary artery without angina pectoris: Secondary | ICD-10-CM

## 2017-08-14 DIAGNOSIS — I10 Essential (primary) hypertension: Secondary | ICD-10-CM

## 2017-08-14 DIAGNOSIS — E782 Mixed hyperlipidemia: Secondary | ICD-10-CM | POA: Diagnosis not present

## 2017-08-14 MED ORDER — AMLODIPINE BESYLATE 10 MG PO TABS: 10.0000 mg | ORAL_TABLET | Freq: Every day | ORAL | 3 refills | Status: AC

## 2017-08-14 MED ORDER — ATORVASTATIN CALCIUM 40 MG PO TABS: ORAL_TABLET | ORAL | 3 refills | Status: AC

## 2017-08-14 MED ORDER — LISINOPRIL-HYDROCHLOROTHIAZIDE 20-12.5 MG PO TABS: 1.0000 | ORAL_TABLET | Freq: Every day | ORAL | 11 refills | Status: DC

## 2017-08-14 MED FILL — ATORVASTATIN 40 MG TABLET: 40 | 90 days supply | Qty: 90 | Fill #0

## 2017-08-14 MED FILL — LISINOPRIL-HCTZ 20-12.5 MG: 20-12.5 | 30 days supply | Qty: 30 | Fill #0

## 2017-08-14 MED FILL — AMLODIPINE BESYLATE 10 MG T: 10 | 90 days supply | Qty: 90 | Fill #0

## 2017-08-14 NOTE — Progress Notes (Signed)
Clinical Summary [name] is a 61 y.o.male seen today for follow up of the following medical problems.   1. Palpitations - previous monitor without significant arrhythmias - he denies any recent symptoms.    2. CAD - multiple stents placed as described below - last cath 2011: LM patent, LAD 95% at apex, patent stent D1, LCX occluded in midsection at prior stent, RCA non dominant with patent prox stent. LVEF 55-60%. - CABG 05/2010 LIMA-LAD, SVG-Dx, SVG-ramus SVG-PL.  - 04/2010 echo LVEF 55-60%, grade II diastolic dysfunction.    - no recent chest pain. No SOB/DOE - compliant with meds  3. HTN - does not check regularly - compliant with meds   4. Hyperlipidemia  - followed by Dr Nevada Crane and endocrinoloigist, reports has been at goal.   Past Medical History:  Diagnosis Date  . Abnormal LFTs   . Adenomatous colon polyp 2012  . Allergy    to Red Meat  . Anxiety   . Arteriosclerotic cardiovascular disease (ASCVD) 2003   Non-Q MI with stent to RCA in 12/2001, normal EF, additional stents to mid CX and D1; cath in 02/2005-progressive or eye disease, 50-70% mid LAD, 90% distal LAD, 50% D1, 60-70% CX, small PDA with diffuse disease, patent RCA stent; repeat cath in 01/2008-no significant progression; non-Q MI in 02/2010 with 100% mid Cx at  prior stent, nondom. RCA with patent stent, 50% RI, nl EF, CABG-04/2010  . Chronic kidney disease    Creatinine of 1.44 in 04/2009  . Chronic obstructive pulmonary disease ([place])   . Coronary artery disease    stent 2011  . Diabetes mellitus   . Erectile dysfunction   . Full dentures   . Gastroesophageal reflux disease   . Hepatic steatosis   . Hyperlipidemia    Predominantly elevated triglycerides  . Hypertension   . Internal hemorrhoids   . Myocardial infarction ([place]) 2002, 2011  . Neuropathy    feet  . Pneumonia 2006   2006  . Tobacco abuse, in remission    35 pack years; discontinued 05/2010  . Wears glasses      Allergies   Allergen Reactions  . Beef-Derived Products Hives    RED MEAT  . Other Hives    Red meat  . Ranexa [Ranolazine] Rash  . Wellbutrin [Bupropion Hcl] Rash     Current Outpatient Medications  Medication Sig Dispense Refill  . Alpha-Lipoic Acid 600 MG CAPS Take by mouth daily.    Marland Kitchen amLODipine (NORVASC) 10 MG tablet TAKE 1 TABLET BY MOUTH ONCE DAILY 90 tablet 0  . aspirin EC 81 MG tablet Take 81 mg by mouth daily.    Marland Kitchen atorvastatin (LIPITOR) 40 MG tablet TAKE 1 TABLET BY MOUTH DAILY AT 6 PM. 30 tablet 0  . diphenhydrAMINE (BENADRYL) 25 MG tablet Take 25 mg by mouth daily.    Marland Kitchen EPINEPHrine (EPI-PEN) 0.3 mg/0.3 mL SOAJ injection Inject 0.3 mg into the muscle as needed. Reported on 04/14/2016    . HUMALOG KWIKPEN 100 UNIT/ML KiwkPen INJECT 10 UNITS UNDER THE SKIN AT THE BEGINNING OF EVENING MEAL ONCE DAILY  4  . lisinopril-hydrochlorothiazide (PRINZIDE,ZESTORETIC) 10-12.5 MG tablet TAKE 1 TABLET BY MOUTH DAILY. 90 tablet 0  . metFORMIN (GLUCOPHAGE) 1000 MG tablet Take 1,000 mg by mouth 2 (two) times daily with a meal.      . Multiple Vitamins-Minerals (MULTIVITAMIN WITH MINERALS) tablet Take 1 tablet by mouth daily.      . Probiotic Product (PROBIOTIC  DAILY PO) Take 1 tablet by mouth daily.    . TRESIBA FLEXTOUCH 200 UNIT/ML SOPN Inject 100 Units into the skin daily.  3   No current facility-administered medications for this visit.      Past Surgical History:  Procedure Laterality Date  . APPENDECTOMY  1980  . COLONOSCOPY W/ POLYPECTOMY  2012   Dr. Olevia Perches; 2 polyps removed one of which was precancerous  . CORONARY ARTERY BYPASS GRAFT  05/2010   4 vessels August 2011  . EYE SURGERY       Allergies  Allergen Reactions  . Beef-Derived Products Hives    RED MEAT  . Other Hives    Red meat  . Ranexa [Ranolazine] Rash  . Wellbutrin [Bupropion Hcl] Rash      Family History  Problem Relation Age of Onset  . Cirrhosis Mother   . Heart disease Father   . Pancreatic cancer Brother    . Cirrhosis Sister   . Colon cancer Neg Hx   . Esophageal cancer Neg Hx   . Rectal cancer Neg Hx   . Stomach cancer Neg Hx      Social History Mr. Cuccaro reports that he quit smoking about 4 years ago. His smoking use included cigarettes. He started smoking about 43 years ago. He has a 15.00 pack-year smoking history. he has never used smokeless tobacco. Mr. Stumph reports that he does not drink alcohol.   Review of Systems CONSTITUTIONAL: No weight loss, fever, chills, weakness or fatigue.  HEENT: Eyes: No visual loss, blurred vision, double vision or yellow sclerae.No hearing loss, sneezing, congestion, runny nose or sore throat.  SKIN: No rash or itching.  CARDIOVASCULAR: per hpi RESPIRATORY: No shortness of breath, cough or sputum.  GASTROINTESTINAL: No anorexia, nausea, vomiting or diarrhea. No abdominal pain or blood.  GENITOURINARY: No burning on urination, no polyuria NEUROLOGICAL: No headache, dizziness, syncope, paralysis, ataxia, numbness or tingling in the extremities. No change in bowel or bladder control.  MUSCULOSKELETAL: No muscle, back pain, joint pain or stiffness.  LYMPHATICS: No enlarged nodes. No history of splenectomy.  PSYCHIATRIC: No history of depression or anxiety.  ENDOCRINOLOGIC: No reports of sweating, cold or heat intolerance. No polyuria or polydipsia.  Marland Kitchen   Physical Examination Vitals:   08/14/17 1122  BP: 140/70  Pulse: 84  SpO2: 97%   Filed Weights   08/14/17 1122  Weight: 179 lb (81.2 kg)    Gen: resting comfortably, no acute distress HEENT: no scleral icterus, pupils equal round and reactive, no palptable cervical adenopathy,  CV: RRR, 2/6 systolic murmur rusb, no jvd Resp: Clear to auscultation bilaterally GI: abdomen is soft, non-tender, non-distended, normal bowel sounds, no hepatosplenomegaly MSK: extremities are warm, no edema.  Skin: warm, no rash Neuro:  no focal deficits Psych: appropriate affect   Diagnostic  Studies  2011 Cath Central aortic pressure 138/73 with a mean of 97. LV pressure 140/22 with an EDP of 24. There was no aortic stenosis.  Left main was normal. LAD was a large vessel coursed to the apex. It gave off a moderate-sized proximal diagonal. Throughout the LAD, there was mild diffuse disease with a focal 30% lesion in the midsection and a high-grade 95% stenosis at the apex. In the first diagonal, there was a previously placed stent, which was patent. There was minor luminal irregularities otherwise.  Left circumflex was a large dominant vessel, gave off a small-to- moderate ramus branch, a small OM branch and then was totally occluded in the  midsection within the previously placed stent. There was a 50% lesion in the mid AV groove circumflex right before the stent. There was also a 50% lesion in the upper portion of the ramus branch.  Right coronary artery was nondominant vessel and had a high anterior takeoff. There was a stent placed in the proximal portion, which was widely patent. There was an RV branch, which was subtotally occluded. The vessel what appeared to be an atrial branch, which provided collaterals to the distal left circumflex system, primarily the left PDA. The distal right coronary once again was nondominant and small.  Left ventriculogram was done in the RAO position showed an EF of 55-60%. No regional wall motion abnormalities.  ASSESSMENT: 1. Three-vessel coronary artery disease as described above. 2. Reocclusion of the mid left circumflex drug-eluting stent with  right-to-left collaterals. 3. Normal left ventricular function.  PLAN: I have reviewed the catheterization films with Dr. Lia Foyer and Dr. Olevia Perches. Given that he has already two layers of drug-eluting stent, which he is re-occluded, likelihood that he would be able to keep this area open. After angioplasty, it would be quite low. We thus recommended titration of  his medical therapy. He was previously on Viagra, but is no longer taking, so I will add Imdur 30 mg a day, may also be able to consider addition of Ranexa. Other possibilities would be possible single-vessel bypass surgery versus consideration of brachytherapy at [facility]. Dr. Lia Foyer will look into this. I will discuss with Dr. Lattie Haw.  04/2010 Cath Study Conclusions  - Left ventricle: The cavity size was normal. Wall thickness was  increased in a pattern of mild LVH. There was mild focal basal  hypertrophy of the septum. Systolic function was normal. The  estimated ejection fraction was in the range of 55% to 60%. There  appears to be mild mid posterior hypokinesis. Features are  consistent with a pseudonormal left ventricular filling pattern,  with concomitant abnormal relaxation and increased filling  pressure (grade 2 diastolic dysfunction). - Aortic valve: There was no stenosis. - Mitral valve: Mild regurgitation. - Left atrium: The atrium was mildly dilated. - Right ventricle: The cavity size was normal. Systolic function was  normal. - Pulmonary arteries: No complete TR doppler jet was measured so  unable to estimate PA systolic pressure. - Inferior vena cava: The vessel was normal in size; the  respirophasic diameter changes were in the normal range (= 50%);  findings are consistent with normal central venous pressure. Impressions:  - Normal LV size with mild LV hypertrophy. EF 55-60% with mild mid  posterior hypokinesis.Normal RV size and systolic function. Mild  mitral regurgitation. Transthoracic echocardiography. M-mode, complete 2D, spectral Doppler, and color Doppler.   02/03/14 Clinic EKG NSR, 1st degree AV block  05/2016 echo Study Conclusions  - Left ventricle: The cavity size was normal. Wall thickness was   increased in a pattern of mild LVH. Systolic function was normal.   The estimated ejection fraction was in  the range of 55% to 60%.   There is hypokinesis of the basal-midinferolateral and inferior   myocardium. Features are consistent with a pseudonormal left   ventricular filling pattern, with concomitant abnormal relaxation   and increased filling pressure (grade 2 diastolic dysfunction). - Aortic valve: Mildly calcified annulus. Trileaflet. Mean gradient   (S): 4 mm Hg. Valve area (VTI): 2.72 cm^2. - Mitral valve: Calcified annulus. There was mild regurgitation. - Left atrium: The atrium was mildly dilated. - Right atrium: Central  venous pressure (est): 3 mm Hg. - Atrial septum: No defect or patent foramen ovale was identified. - Tricuspid valve: There was physiologic regurgitation. - Pericardium, extracardiac: There was no pericardial effusion.  Impressions:  - Mild LVH with LVEF 55-60%. Mid to basal inferolateral/inferior   hypokinesis noted. Grade 2 diastolic dysfunction with increased   LV filling pressure. Mild left atrial enlargement. MAC with mild   mitral regurgitation. Aortic annular calcification.   Assessment and Plan  1. Palpitations - no recent symptoms, continue to monitor - EKG in clinic today shows SR without acute ischemic changes  2. CAD - no significant symptoms, he will continue current meds  3. HTN - based on 2017 [place] guidelines he is above goal of <130/80 - we will increase his lisinoparil/HCTZ to 20/12.4m daily. Check BMET in 2 weeks  4. Hyperlipidemia - request labs from pcp - continue statin    F/u 1 year    JArnoldo Lenis M.D.

## 2017-08-14 NOTE — Patient Instructions (Signed)
Medication Instructions:  Your physician has recommended you make the following change in your medication:  Prinzide 20-12.5    Labwork: Your physician recommends that you return for lab work in: 2 weeks    Testing/Procedures: NONE   Follow-Up: Your physician wants you to follow-up in: 1 Year with Dr. Harl Bowie. You will receive a reminder letter in the mail two months in advance. If you don't receive a letter, please call our office to schedule the follow-up appointment.   Any Other Special Instructions Will Be Listed Below (If Applicable). Thank you for choosing [facility]!     If you need a refill on your cardiac medications before your next appointment, please call your pharmacy.

## 2017-08-15 ENCOUNTER — Encounter: Payer: Self-pay | Admitting: Cardiology

## 2017-08-21 MED FILL — metFORMIN HCL 1000 MG TABS: 1000 | 90 days supply | Qty: 180 | Fill #2

## 2017-08-23 DIAGNOSIS — E1142 Type 2 diabetes mellitus with diabetic polyneuropathy: Secondary | ICD-10-CM | POA: Diagnosis not present

## 2017-08-23 DIAGNOSIS — E782 Mixed hyperlipidemia: Secondary | ICD-10-CM | POA: Diagnosis not present

## 2017-08-23 DIAGNOSIS — D509 Iron deficiency anemia, unspecified: Secondary | ICD-10-CM | POA: Diagnosis not present

## 2017-08-23 DIAGNOSIS — K7581 Nonalcoholic steatohepatitis (NASH): Secondary | ICD-10-CM | POA: Diagnosis not present

## 2017-08-23 DIAGNOSIS — F17210 Nicotine dependence, cigarettes, uncomplicated: Secondary | ICD-10-CM | POA: Diagnosis not present

## 2017-08-23 DIAGNOSIS — I2510 Atherosclerotic heart disease of native coronary artery without angina pectoris: Secondary | ICD-10-CM | POA: Diagnosis not present

## 2017-08-23 DIAGNOSIS — I10 Essential (primary) hypertension: Secondary | ICD-10-CM | POA: Diagnosis not present

## 2017-08-23 DIAGNOSIS — F419 Anxiety disorder, unspecified: Secondary | ICD-10-CM | POA: Diagnosis not present

## 2017-09-18 DIAGNOSIS — R1011 Right upper quadrant pain: Secondary | ICD-10-CM | POA: Diagnosis not present

## 2017-09-18 DIAGNOSIS — R221 Localized swelling, mass and lump, neck: Secondary | ICD-10-CM | POA: Diagnosis not present

## 2017-09-24 MED FILL — LISINOPRIL-HCTZ 20-12.5 MG: 20-12.5 | 30 days supply | Qty: 30 | Fill #1

## 2017-09-24 MED FILL — TRESIBA FLEXTOUCH 200 UNITS: 200 | 90 days supply | Qty: 36 | Fill #2

## 2017-10-12 ENCOUNTER — Encounter: Payer: Self-pay | Admitting: Physician Assistant

## 2017-10-12 ENCOUNTER — Ambulatory Visit: Payer: 59 | Admitting: Physician Assistant

## 2017-10-12 VITALS — BP 161/82 | HR 106 | Temp 97.8°F | Wt 173.4 lb

## 2017-10-12 DIAGNOSIS — T7840XA Allergy, unspecified, initial encounter: Secondary | ICD-10-CM

## 2017-10-12 DIAGNOSIS — R220 Localized swelling, mass and lump, head: Secondary | ICD-10-CM

## 2017-10-12 MED ORDER — DIPHENHYDRAMINE HCL 50 MG/ML IJ SOLN
50.0000 mg | Freq: Once | INTRAMUSCULAR | Status: AC
  Administered 2017-10-12: 50 mg via INTRAMUSCULAR

## 2017-10-12 MED ORDER — EPINEPHRINE 0.3 MG/0.3ML IJ SOAJ: 0.3000 mg | INTRAMUSCULAR | 11 refills | Status: DC | PRN

## 2017-10-12 MED ORDER — EPINEPHRINE 0.3 MG/0.3ML IJ SOAJ: 0.3000 mg | Freq: Once | INTRAMUSCULAR | Status: AC

## 2017-10-12 MED ORDER — EPINEPHRINE PF 1 MG/ML IJ SOLN
0.3000 mg | Freq: Once | INTRAMUSCULAR | Status: AC
  Administered 2017-10-12: 0.3 mg via INTRAMUSCULAR

## 2017-10-12 NOTE — Progress Notes (Signed)
BP (!) 161/82 (BP Location: Right Arm, Patient Position: Sitting, Cuff Size: Normal)   Pulse (!) 106   Temp 97.8 F (36.6 C) (Oral)   Wt 173 lb 6.4 oz (78.7 kg)   SpO2 98%   BMI 27.16 kg/m    Subjective:    Patient ID: [name], male    DOB: [birth year], 62 y.o.   MRN: [id number]  HPI: [name] is a 62 y.o. male presenting on 10/12/2017 for Allergic Reaction  This patient comes in emergently with his wife.  He has not a patient here.  He has a history of alpha gal allergy.  They do not report any dietary indiscretions that they know of.  They will go back and look at the foods that were eaten yesterday.  He does have an allergist.  At home he took Benadryl 50 mg.  Tongue is swollen primarily on the right side but enough that he is having difficulty with talking.  He is breathing and does not appear in any distress.  He does have swelling that has come up on the side of his neck worse on the right than the left.  Relevant past medical, surgical, family and social history reviewed and updated as indicated. Allergies and medications reviewed and updated.  Past Medical History:  Diagnosis Date  . Abnormal LFTs   . Adenomatous colon polyp 2012  . Allergy    to Red Meat  . Anxiety   . Arteriosclerotic cardiovascular disease (ASCVD) 2003   Non-Q MI with stent to RCA in 12/2001, normal EF, additional stents to mid CX and D1; cath in 02/2005-progressive or eye disease, 50-70% mid LAD, 90% distal LAD, 50% D1, 60-70% CX, small PDA with diffuse disease, patent RCA stent; repeat cath in 01/2008-no significant progression; non-Q MI in 02/2010 with 100% mid Cx at  prior stent, nondom. RCA with patent stent, 50% RI, nl EF, CABG-04/2010  . Chronic kidney disease    Creatinine of 1.44 in 04/2009  . Chronic obstructive pulmonary disease ([place])   . Coronary artery disease    stent 2011  . Diabetes mellitus   . Erectile dysfunction   . Full dentures   . Gastroesophageal reflux disease   .  Hepatic steatosis   . Hyperlipidemia    Predominantly elevated triglycerides  . Hypertension   . Internal hemorrhoids   . Myocardial infarction ([place]) 2002, 2011  . Neuropathy    feet  . Pneumonia 2006   2006  . Tobacco abuse, in remission    35 pack years; discontinued 05/2010  . Wears glasses     Past Surgical History:  Procedure Laterality Date  . APPENDECTOMY  1980  . CATARACT EXTRACTION W/PHACO Left 09/21/2014   Procedure: CATARACT EXTRACTION PHACO AND INTRAOCULAR LENS PLACEMENT (IOC);  Surgeon: Tonny Branch, MD;  Location: [facility];  Service: Ophthalmology;  Laterality: Left;  CDE:9.77  . COLONOSCOPY W/ POLYPECTOMY  2012   Dr. Olevia Perches; 2 polyps removed one of which was precancerous  . CORONARY ARTERY BYPASS GRAFT  05/2010   4 vessels August 2011  . EYE SURGERY    . HEMORRHOID SURGERY N/A 02/24/2014   Procedure: HEMORRHOIDECTOMY;  Surgeon: Joyice Faster. Cornett, MD;  Location: [facility];  Service: General;  Laterality: N/A;  . SHOULDER ARTHROSCOPY WITH OPEN ROTATOR CUFF REPAIR AND DISTAL CLAVICLE ACROMINECTOMY Left 11/23/2015   Procedure: SHOULDER ARTHROSCOPY WITH MINI-OPEN ROTATOR CUFF REPAIR, DISTAL CLAVICLE RESECTION AND SUBACROMIAL DECOMPRESSION.;  Surgeon: Garald Balding, MD;  Location: [facility];  Service: Orthopedics;  Laterality: Left;  LEFT SHOULDER ARTHROSCOPIC SUBACROMIAL DECOMPRESSION, DISTAL CLAVICLE RESECTION, MINI-OPEN ROTATOR CUFF REPAIR.    Review of Systems  Constitutional: Positive for fatigue. Negative for appetite change, chills and diaphoresis.  HENT: Negative.   Eyes: Negative.  Negative for pain and visual disturbance.  Respiratory: Negative.  Negative for cough, chest tightness, shortness of breath and wheezing.   Cardiovascular: Negative.  Negative for chest pain, palpitations and leg swelling.  Gastrointestinal: Negative.  Negative for abdominal pain, diarrhea, nausea and vomiting.  Endocrine: Negative.   Genitourinary: Negative.     Musculoskeletal: Negative.   Skin: Negative.  Negative for color change and rash.  Neurological: Negative.  Negative for weakness, numbness and headaches.  Psychiatric/Behavioral: Negative.     Allergies as of 10/12/2017      Reactions   Beef-derived Products Hives   RED MEAT   Other Hives   Red meat   Ranexa [ranolazine] Rash   Wellbutrin [bupropion Hcl] Rash      Medication List        Accurate as of 10/12/17  8:51 AM. Always use your most recent med list.          Alpha-Lipoic Acid 600 MG Caps Take by mouth daily.   amLODipine 10 MG tablet Commonly known as:  NORVASC Take 1 tablet (10 mg total) daily by mouth.   aspirin EC 81 MG tablet Take 81 mg by mouth daily.   atorvastatin 40 MG tablet Commonly known as:  LIPITOR TAKE 1 TABLET BY MOUTH DAILY AT 6 PM.   diphenhydrAMINE 25 MG tablet Commonly known as:  BENADRYL Take 25 mg by mouth daily.   EPINEPHrine 0.3 mg/0.3 mL Soaj injection Commonly known as:  EPI-PEN Inject 0.3 mLs (0.3 mg total) into the muscle as needed. Reported on 04/14/2016   HUMALOG KWIKPEN 100 UNIT/ML KiwkPen Generic drug:  insulin lispro INJECT 10 UNITS UNDER THE SKIN AT THE BEGINNING OF EVENING MEAL ONCE DAILY   lisinopril-hydrochlorothiazide 20-12.5 MG tablet Commonly known as:  ZESTORETIC Take 1 tablet daily by mouth.   metFORMIN 1000 MG tablet Commonly known as:  GLUCOPHAGE Take 1,000 mg by mouth 2 (two) times daily with a meal.   multivitamin with minerals tablet Take 1 tablet by mouth daily.   PROBIOTIC DAILY PO Take 1 tablet by mouth daily.   TRESIBA FLEXTOUCH 200 UNIT/ML Sopn Generic drug:  Insulin Degludec Inject 100 Units into the skin daily.          Objective:    BP (!) 161/82 (BP Location: Right Arm, Patient Position: Sitting, Cuff Size: Normal)   Pulse (!) 106   Temp 97.8 F (36.6 C) (Oral)   Wt 173 lb 6.4 oz (78.7 kg)   SpO2 98%   BMI 27.16 kg/m   Allergies  Allergen Reactions  . Beef-Derived Products  Hives    RED MEAT  . Other Hives    Red meat  . Ranexa [Ranolazine] Rash  . Wellbutrin [Bupropion Hcl] Rash    Physical Exam  Constitutional: He appears well-developed and well-nourished. No distress.  HENT:  Head: Normocephalic and atraumatic.  Mouth/Throat: Mucous membranes are normal.    Swelling of the tongue predominantly on the right side, blanching it is so swollen.  Also swelling in the right anterior sternocleidomastoid area.  Greater on the right than the left.  He tolerated the Benadryl 50 mg IM and epinephrine 0.3 mg subcu very well.  The swelling started to resolve before  he left.  His talking was improved.  His wife was there the entire time.  Eyes: Conjunctivae and EOM are normal. Pupils are equal, round, and reactive to light.  Neck: Normal range of motion. Neck supple.  Cardiovascular: Normal rate and regular rhythm. Exam reveals friction rub. Exam reveals no gallop.  No murmur heard. Pulmonary/Chest: Effort normal and breath sounds normal. No respiratory distress. He has no wheezes. He has no rales. He exhibits no tenderness.  Abdominal: Soft. Bowel sounds are normal.  Musculoskeletal: Normal range of motion.  Skin: Skin is warm and dry. He is not diaphoretic.        Assessment & Plan:   1. Tongue swelling - diphenhydrAMINE (BENADRYL) injection 50 mg - EPINEPHrine (EPI-PEN) injection 0.3 mg - EPINEPHrine (ADRENALIN) 0.3 mg  2. Allergic reaction, initial encounter - EPINEPHrine 0.3 mg/0.3 mL IJ SOAJ injection; Inject 0.3 mLs (0.3 mg total) into the muscle as needed. Reported on 04/14/2016  Dispense: 1 Device; Refill: 11    Current Outpatient Medications:  .  Alpha-Lipoic Acid 600 MG CAPS, Take by mouth daily., Disp: , Rfl:  .  amLODipine (NORVASC) 10 MG tablet, Take 1 tablet (10 mg total) daily by mouth., Disp: 90 tablet, Rfl: 3 .  aspirin EC 81 MG tablet, Take 81 mg by mouth daily., Disp: , Rfl:  .  atorvastatin (LIPITOR) 40 MG tablet, TAKE 1 TABLET BY  MOUTH DAILY AT 6 PM. (Patient taking differently: Take 40 mg by mouth daily at 6 PM. TAKE 1 TABLET BY MOUTH DAILY AT 6 PM.), Disp: 90 tablet, Rfl: 3 .  diphenhydrAMINE (BENADRYL) 25 MG tablet, Take 25 mg by mouth daily., Disp: , Rfl:  .  EPINEPHrine 0.3 mg/0.3 mL IJ SOAJ injection, Inject 0.3 mLs (0.3 mg total) into the muscle as needed. Reported on 04/14/2016, Disp: 1 Device, Rfl: 11 .  HUMALOG KWIKPEN 100 UNIT/ML KiwkPen, INJECT 10 UNITS UNDER THE SKIN AT THE BEGINNING OF EVENING MEAL ONCE DAILY, Disp: , Rfl: 4 .  lisinopril-hydrochlorothiazide (ZESTORETIC) 20-12.5 MG tablet, Take 1 tablet daily by mouth., Disp: 30 tablet, Rfl: 11 .  metFORMIN (GLUCOPHAGE) 1000 MG tablet, Take 1,000 mg by mouth 2 (two) times daily with a meal.  , Disp: , Rfl:  .  Multiple Vitamins-Minerals (MULTIVITAMIN WITH MINERALS) tablet, Take 1 tablet by mouth daily.  , Disp: , Rfl:  .  Probiotic Product (PROBIOTIC DAILY PO), Take 1 tablet by mouth daily., Disp: , Rfl:  .  TRESIBA FLEXTOUCH 200 UNIT/ML SOPN, Inject 100 Units into the skin daily., Disp: , Rfl: 3  Current Facility-Administered Medications:  .  EPINEPHrine (EPI-PEN) injection 0.3 mg, 0.3 mg, Intramuscular, Once, Casimir Barcellos S, PA-C Continue all other maintenance medications as listed above.  Follow up plan: Recheck prn  Educational handout given for information  Terald Sleeper PA-C [facility] [address]  [place], [place] [address] [phone]   10/12/2017, 8:51 AM

## 2017-10-12 NOTE — Patient Instructions (Signed)
He has had an allergic reaction today.  He was given Benadryl and epinephrine here in the office.  Please follow-up with your allergist.  Please stop any meat products that may contain alpha gal. Keep a diary of things that you are eating to see if any ingredient can be treated out as a new allergen.  Start Zyrtec 10 mg 1 daily  Continue to use Benadryl 50 mg 4 times a day if needed  EpiPen has been sent to your pharmacy  If anything suddenly changes or you have difficulty breathing please call 911 and get to hospital.

## 2017-10-16 ENCOUNTER — Ambulatory Visit: Payer: 59 | Admitting: General Surgery

## 2017-10-25 ENCOUNTER — Encounter: Payer: Self-pay | Admitting: General Surgery

## 2017-10-25 ENCOUNTER — Ambulatory Visit: Payer: BLUE CROSS/BLUE SHIELD | Admitting: General Surgery

## 2017-10-25 ENCOUNTER — Ambulatory Visit: Payer: 59 | Admitting: General Surgery

## 2017-10-25 VITALS — BP 168/91 | HR 85 | Temp 98.6°F | Ht 67.0 in | Wt 178.0 lb

## 2017-10-25 DIAGNOSIS — Z8719 Personal history of other diseases of the digestive system: Secondary | ICD-10-CM | POA: Diagnosis not present

## 2017-10-25 NOTE — Patient Instructions (Signed)
Biliary Colic, Adult °Biliary colic is severe pain caused by a problem with a small organ in the upper right part of your belly (gallbladder). The gallbladder stores a digestive fluid produced in the liver (bile) that helps the body break down fat. Bile and other digestive enzymes are carried from the liver to the small intestine though tube-like structures (bile ducts). The gallbladder and the bile ducts form the biliary tract. °Sometimes hard deposits of digestive fluids form in the gallbladder (gallstones) and block the flow of bile from the gallbladder, causing biliary colic. This condition is also called a gallbladder attack. Gallstones can be as small as a grain of sand or as big as a golf ball. There could be just one gallstone in the gallbladder, or there could be many. °What are the causes? °Biliary colic is usually caused by gallstones. Less often, a tumor could block the flow of bile from the gallbladder and trigger biliary colic. °What increases the risk? °This condition is more likely to develop in: °· Women. °· People of Hispanic descent. °· People with a family history of gallstones. °· People who are obese. °· People who suddenly or quickly lose weight. °· People who eat a high-calorie, low-fiber diet that is rich in refined carbs (carbohydrates), such as white bread and white rice. °· People who have an intestinal disease that affects nutrient absorption, such as Crohn disease. °· People who have a metabolic condition, such as metabolic syndrome or diabetes. ° °What are the signs or symptoms? °Severe pain in the upper right side of the belly is the main symptom of biliary colic. You may feel this pain below the chest but above the hip. This pain often occurs at night or after eating a very fatty meal. This pain may get worse for up to an hour and last as long as 12 hours. In most cases, the pain fades (subsides) within a couple hours. °Other symptoms of this condition include: °· Nausea and  vomiting. °· Pain under the right shoulder. ° °How is this diagnosed? °This condition is diagnosed based on your medical history, your symptoms, and a physical exam. You may have tests, including: °· Blood tests to rule out infection or inflammation of the bile ducts, gallbladder, pancreas, or liver. °· Imaging studies such as: °? Ultrasound. °? CT scan. °? MRI. ° °In some cases, you may need to have an imaging study done using a small amount of radioactive material (nuclear medicine) to confirm the diagnosis. °How is this treated? °Treatment for this condition may include medicine to relieve your pain or nausea. If you have gallstones that are causing biliary colic, you may need surgery to remove the gallbladder (cholecystectomy). Gallstones can also be dissolved gradually with medicine. It may take months or years before the gallstones are completely gone. °Follow these instructions at home: °· Take over-the-counter and prescription medicines only as told by your health care provider. °· Drink enough fluid to keep your urine clear or pale yellow. °· Follow instructions from your health care provider about eating or drinking restrictions. These may include avoiding: °? Fatty, greasy, and fried foods. °? Any foods that make the pain worse. °? Overeating. °? Having a large meal after not eating for a while. °· Keep all follow-up visits as told by your health care provider. This is important. °How is this prevented? °Steps to prevent this condition include: °· Maintaining a healthy body weight. °· Getting regular exercise. °· Eating a healthy, high-fiber, low-fat diet. °· Limiting   how much sugar and refined carbs you eat, such as sweets, white flour, and white rice. ° °Contact a health care provider if: °· Your pain lasts more than 5 hours. °· You vomit. °· You have a fever and chills. °· Your pain gets worse. °Get help right away if: °· Your skin or the whites of your eyes look yellow (jaundice). °· Your have  tea-colored urine and light-colored stools. °· You are dizzy or you faint. °This information is not intended to replace advice given to you by your health care provider. Make sure you discuss any questions you have with your health care provider. °Document Released: 02/26/2006 Document Revised: 05/23/2016 Document Reviewed: 04/10/2016 °Elsevier Interactive Patient Education © 2018 [employer] ° °

## 2017-10-25 NOTE — Progress Notes (Signed)
[name]; [id number]; July 11, 1956   HPI Patient is a 62 year old white male who was referred to my care by Dr. Nevada Crane for evaluation and treatment of a dysfunctional gallbladder.  He states that 3 months ago, he had a single episode of right flank pain.  He had an ultrasound in the remote past which only showed biliary sludge.  He states that since that episode, he has had no further symptoms.  The episode occurred in the evening and was not associated with food.  It went away spontaneously.  He currently has 0 out of 10 pain.  He denies any nausea, vomiting, fatty food intolerance, fever, chills, or jaundice.  He did have a HIDA scan which showed 33% gallbladder ejection fraction but no reproducible symptoms. Past Medical History:  Diagnosis Date  . Abnormal LFTs   . Adenomatous colon polyp 2012  . Allergy    to Red Meat  . Anxiety   . Arteriosclerotic cardiovascular disease (ASCVD) 2003   Non-Q MI with stent to RCA in 12/2001, normal EF, additional stents to mid CX and D1; cath in 02/2005-progressive or eye disease, 50-70% mid LAD, 90% distal LAD, 50% D1, 60-70% CX, small PDA with diffuse disease, patent RCA stent; repeat cath in 01/2008-no significant progression; non-Q MI in 02/2010 with 100% mid Cx at  prior stent, nondom. RCA with patent stent, 50% RI, nl EF, CABG-04/2010  . Chronic kidney disease    Creatinine of 1.44 in 04/2009  . Chronic obstructive pulmonary disease ([place])   . Coronary artery disease    stent 2011  . Diabetes mellitus   . Erectile dysfunction   . Full dentures   . Gastroesophageal reflux disease   . Hepatic steatosis   . Hyperlipidemia    Predominantly elevated triglycerides  . Hypertension   . Internal hemorrhoids   . Myocardial infarction ([place]) 2002, 2011  . Neuropathy    feet  . Pneumonia 2006   2006  . Tobacco abuse, in remission    35 pack years; discontinued 05/2010  . Wears glasses     Past Surgical History:  Procedure Laterality Date  .  APPENDECTOMY  1980  . CATARACT EXTRACTION W/PHACO Left 09/21/2014   Procedure: CATARACT EXTRACTION PHACO AND INTRAOCULAR LENS PLACEMENT (IOC);  Surgeon: Tonny Branch, MD;  Location: [facility];  Service: Ophthalmology;  Laterality: Left;  CDE:9.77  . COLONOSCOPY W/ POLYPECTOMY  2012   Dr. Olevia Perches; 2 polyps removed one of which was precancerous  . CORONARY ARTERY BYPASS GRAFT  05/2010   4 vessels August 2011  . EYE SURGERY    . HEMORRHOID SURGERY N/A 02/24/2014   Procedure: HEMORRHOIDECTOMY;  Surgeon: Joyice Faster. Cornett, MD;  Location: [facility];  Service: General;  Laterality: N/A;  . SHOULDER ARTHROSCOPY WITH OPEN ROTATOR CUFF REPAIR AND DISTAL CLAVICLE ACROMINECTOMY Left 11/23/2015   Procedure: SHOULDER ARTHROSCOPY WITH MINI-OPEN ROTATOR CUFF REPAIR, DISTAL CLAVICLE RESECTION AND SUBACROMIAL DECOMPRESSION.;  Surgeon: Garald Balding, MD;  Location: [facility];  Service: Orthopedics;  Laterality: Left;  LEFT SHOULDER ARTHROSCOPIC SUBACROMIAL DECOMPRESSION, DISTAL CLAVICLE RESECTION, MINI-OPEN ROTATOR CUFF REPAIR.    Family History  Problem Relation Age of Onset  . Cirrhosis Mother   . Heart disease Father   . Pancreatic cancer Brother   . Cirrhosis Sister   . Colon cancer Neg Hx   . Esophageal cancer Neg Hx   . Rectal cancer Neg Hx   . Stomach cancer Neg Hx     Current Outpatient Medications on  File Prior to Visit  Medication Sig Dispense Refill  . Alpha-Lipoic Acid 600 MG CAPS Take by mouth daily.    Marland Kitchen amLODipine (NORVASC) 10 MG tablet Take 1 tablet (10 mg total) daily by mouth. 90 tablet 3  . aspirin EC 81 MG tablet Take 81 mg by mouth daily.    Marland Kitchen atorvastatin (LIPITOR) 40 MG tablet TAKE 1 TABLET BY MOUTH DAILY AT 6 PM. (Patient taking differently: Take 40 mg by mouth daily at 6 PM. TAKE 1 TABLET BY MOUTH DAILY AT 6 PM.) 90 tablet 3  . diphenhydrAMINE (BENADRYL) 25 MG tablet Take 25 mg by mouth daily.    Marland Kitchen EPINEPHrine 0.3 mg/0.3 mL IJ SOAJ injection Inject 0.3 mLs (0.3 mg total)  into the muscle as needed. Reported on 04/14/2016 1 Device 11  . HUMALOG KWIKPEN 100 UNIT/ML KiwkPen INJECT 10 UNITS UNDER THE SKIN AT THE BEGINNING OF EVENING MEAL ONCE DAILY  4  . lisinopril-hydrochlorothiazide (ZESTORETIC) 20-12.5 MG tablet Take 1 tablet daily by mouth. 30 tablet 11  . metFORMIN (GLUCOPHAGE) 1000 MG tablet Take 1,000 mg by mouth 2 (two) times daily with a meal.      . Multiple Vitamins-Minerals (MULTIVITAMIN WITH MINERALS) tablet Take 1 tablet by mouth daily.      . Probiotic Product (PROBIOTIC DAILY PO) Take 1 tablet by mouth daily.    . TRESIBA FLEXTOUCH 200 UNIT/ML SOPN Inject 100 Units into the skin daily.  3   Current Facility-Administered Medications on File Prior to Visit  Medication Dose Route Frequency Provider Last Rate Last Dose  . EPINEPHrine (EPI-PEN) injection 0.3 mg  0.3 mg Intramuscular Once Terald Sleeper, PA-C        Allergies  Allergen Reactions  . Beef-Derived Products Hives    RED MEAT  . Other Hives    Red meat  . Ranexa [Ranolazine] Rash  . Wellbutrin [Bupropion Hcl] Rash    Social History   Substance and Sexual Activity  Alcohol Use No  . Alcohol/week: 0.0 oz    Social History   Tobacco Use  Smoking Status Former Smoker  . Packs/day: 0.50  . Years: 30.00  . Pack years: 15.00  . Types: Cigarettes  . Start date: 02/14/1974  . Last attempt to quit: 08/14/2013  . Years since quitting: 4.2  Smokeless Tobacco Never Used    Review of Systems  Constitutional: Negative.   HENT: Negative.   Eyes: Negative.   Respiratory: Negative.   Cardiovascular: Negative.   Gastrointestinal: Negative.   Genitourinary: Negative.   Musculoskeletal: Negative.   Skin: Negative.   Neurological: Negative.   Endo/Heme/Allergies: Negative.   Psychiatric/Behavioral: Negative.     Objective   Vitals:   10/25/17 1152  BP: (!) 168/91  Pulse: 85  Temp: 98.6 F (37 C)    Physical Exam  Constitutional: He is oriented to person, place, and time and  well-developed, well-nourished, and in no distress.  HENT:  Head: Normocephalic and atraumatic.  Eyes: No scleral icterus.  Cardiovascular: Normal rate, regular rhythm and normal heart sounds. Exam reveals no friction rub.  No murmur heard. Pulmonary/Chest: Effort normal and breath sounds normal. No respiratory distress. He has no wheezes. He has no rales.  Abdominal: Soft. Bowel sounds are normal. He exhibits no distension. There is no tenderness. There is no rebound.  Neurological: He is alert and oriented to person, place, and time.  Skin: Skin is warm and dry.  Vitals reviewed.  HIDA scan report reviewed  LFT's reviewed. Assessment  History of colonic cholecystitis, currently asymptomatic Plan   As patient has had no symptoms of biliary colic, I would not take out his gallbladder at this time.  He understands that and agrees.  Literature was given about biliary colic.  Follow-up here as needed.

## 2017-10-29 ENCOUNTER — Telehealth: Payer: Self-pay | Admitting: Cardiology

## 2017-10-29 DIAGNOSIS — Z79899 Other long term (current) drug therapy: Secondary | ICD-10-CM

## 2017-10-29 MED ORDER — CHLORTHALIDONE 25 MG PO TABS: 12.5000 mg | ORAL_TABLET | Freq: Every day | ORAL | 3 refills | Status: DC

## 2017-10-29 NOTE — Telephone Encounter (Signed)
Pt had an allergic reaction to lisinopril-hydrochlorothiazide (ZESTORETIC) 20-12.5 MG tablet [[id number]]  Please give him a call @ [phone]

## 2017-10-29 NOTE — Telephone Encounter (Signed)
Pt stopped medication. I listed lisinopril as an allergy(high). Sent in medication to [facility], put lab orders in. Pt voiced understanding of plan.

## 2017-10-29 NOTE — Telephone Encounter (Signed)
Stop lisinopril/HCTZ. The reaction he had sounds like angioedema which can occur typically with the lisinopril part of that medication. We need to list lisinopril as allergy, he should not be on any medicine in the future that is similar.  Start chlorthalidone 12.5mg  daily, check BMET/Mg in 2 weeks   Zandra Abts MD

## 2017-10-29 NOTE — Telephone Encounter (Signed)
Returned pt call. He states that he woke up a few days ago with his tongue swollen so bad that he was drooling. He went to his pcp, and he was given a shot to help the reaction. He continued taking his lisinopril-hctz, and on Friday his lips swelled up so much he thought they were go pop. He then stopped the medication. He has not had any since Friday. He asks that you switch his medication to a generic if possible.

## 2017-11-19 DIAGNOSIS — I10 Essential (primary) hypertension: Secondary | ICD-10-CM | POA: Diagnosis not present

## 2017-11-19 DIAGNOSIS — E782 Mixed hyperlipidemia: Secondary | ICD-10-CM | POA: Diagnosis not present

## 2017-11-19 DIAGNOSIS — E1142 Type 2 diabetes mellitus with diabetic polyneuropathy: Secondary | ICD-10-CM | POA: Diagnosis not present

## 2017-11-19 DIAGNOSIS — K7581 Nonalcoholic steatohepatitis (NASH): Secondary | ICD-10-CM | POA: Diagnosis not present

## 2017-11-23 DIAGNOSIS — I2510 Atherosclerotic heart disease of native coronary artery without angina pectoris: Secondary | ICD-10-CM | POA: Diagnosis not present

## 2017-11-23 DIAGNOSIS — Z720 Tobacco use: Secondary | ICD-10-CM | POA: Diagnosis not present

## 2017-11-23 DIAGNOSIS — I10 Essential (primary) hypertension: Secondary | ICD-10-CM | POA: Diagnosis not present

## 2017-11-23 DIAGNOSIS — D509 Iron deficiency anemia, unspecified: Secondary | ICD-10-CM | POA: Diagnosis not present

## 2017-11-23 DIAGNOSIS — E1165 Type 2 diabetes mellitus with hyperglycemia: Secondary | ICD-10-CM | POA: Diagnosis not present

## 2017-11-23 DIAGNOSIS — K7581 Nonalcoholic steatohepatitis (NASH): Secondary | ICD-10-CM | POA: Diagnosis not present

## 2017-11-23 DIAGNOSIS — Z6829 Body mass index (BMI) 29.0-29.9, adult: Secondary | ICD-10-CM | POA: Diagnosis not present

## 2017-12-26 DIAGNOSIS — L280 Lichen simplex chronicus: Secondary | ICD-10-CM | POA: Diagnosis not present

## 2018-02-12 DIAGNOSIS — I2510 Atherosclerotic heart disease of native coronary artery without angina pectoris: Secondary | ICD-10-CM | POA: Diagnosis not present

## 2018-02-12 DIAGNOSIS — F5101 Primary insomnia: Secondary | ICD-10-CM | POA: Diagnosis not present

## 2018-02-12 DIAGNOSIS — R109 Unspecified abdominal pain: Secondary | ICD-10-CM | POA: Diagnosis not present

## 2018-02-12 DIAGNOSIS — E1165 Type 2 diabetes mellitus with hyperglycemia: Secondary | ICD-10-CM | POA: Diagnosis not present

## 2018-02-18 DIAGNOSIS — K7581 Nonalcoholic steatohepatitis (NASH): Secondary | ICD-10-CM | POA: Diagnosis not present

## 2018-02-18 DIAGNOSIS — E1142 Type 2 diabetes mellitus with diabetic polyneuropathy: Secondary | ICD-10-CM | POA: Diagnosis not present

## 2018-02-18 DIAGNOSIS — D649 Anemia, unspecified: Secondary | ICD-10-CM | POA: Diagnosis not present

## 2018-02-18 DIAGNOSIS — I10 Essential (primary) hypertension: Secondary | ICD-10-CM | POA: Diagnosis not present

## 2018-06-19 DIAGNOSIS — Z85828 Personal history of other malignant neoplasm of skin: Secondary | ICD-10-CM | POA: Diagnosis not present

## 2018-06-19 DIAGNOSIS — L280 Lichen simplex chronicus: Secondary | ICD-10-CM | POA: Diagnosis not present

## 2018-06-19 DIAGNOSIS — L570 Actinic keratosis: Secondary | ICD-10-CM | POA: Diagnosis not present

## 2018-06-19 DIAGNOSIS — L821 Other seborrheic keratosis: Secondary | ICD-10-CM | POA: Diagnosis not present

## 2018-06-20 DIAGNOSIS — E782 Mixed hyperlipidemia: Secondary | ICD-10-CM | POA: Diagnosis not present

## 2018-06-20 DIAGNOSIS — E1165 Type 2 diabetes mellitus with hyperglycemia: Secondary | ICD-10-CM | POA: Diagnosis not present

## 2018-06-20 DIAGNOSIS — E1142 Type 2 diabetes mellitus with diabetic polyneuropathy: Secondary | ICD-10-CM | POA: Diagnosis not present

## 2018-06-20 DIAGNOSIS — K7581 Nonalcoholic steatohepatitis (NASH): Secondary | ICD-10-CM | POA: Diagnosis not present

## 2018-06-20 DIAGNOSIS — I10 Essential (primary) hypertension: Secondary | ICD-10-CM | POA: Diagnosis not present

## 2018-06-25 DIAGNOSIS — E1142 Type 2 diabetes mellitus with diabetic polyneuropathy: Secondary | ICD-10-CM | POA: Diagnosis not present

## 2018-06-25 DIAGNOSIS — K7581 Nonalcoholic steatohepatitis (NASH): Secondary | ICD-10-CM | POA: Diagnosis not present

## 2018-07-30 DIAGNOSIS — E1142 Type 2 diabetes mellitus with diabetic polyneuropathy: Secondary | ICD-10-CM | POA: Diagnosis not present

## 2018-07-30 DIAGNOSIS — K7581 Nonalcoholic steatohepatitis (NASH): Secondary | ICD-10-CM | POA: Diagnosis not present

## 2018-07-30 DIAGNOSIS — E782 Mixed hyperlipidemia: Secondary | ICD-10-CM | POA: Diagnosis not present

## 2018-07-30 DIAGNOSIS — Z23 Encounter for immunization: Secondary | ICD-10-CM | POA: Diagnosis not present

## 2018-07-30 DIAGNOSIS — I10 Essential (primary) hypertension: Secondary | ICD-10-CM | POA: Diagnosis not present

## 2018-08-02 ENCOUNTER — Other Ambulatory Visit: Payer: Self-pay | Admitting: Cardiology

## 2018-10-29 DIAGNOSIS — E1142 Type 2 diabetes mellitus with diabetic polyneuropathy: Secondary | ICD-10-CM | POA: Diagnosis not present

## 2018-10-29 DIAGNOSIS — E1165 Type 2 diabetes mellitus with hyperglycemia: Secondary | ICD-10-CM | POA: Diagnosis not present

## 2018-10-29 DIAGNOSIS — I10 Essential (primary) hypertension: Secondary | ICD-10-CM | POA: Diagnosis not present

## 2018-10-29 DIAGNOSIS — E782 Mixed hyperlipidemia: Secondary | ICD-10-CM | POA: Diagnosis not present

## 2018-11-05 DIAGNOSIS — I10 Essential (primary) hypertension: Secondary | ICD-10-CM | POA: Diagnosis not present

## 2018-11-05 DIAGNOSIS — E1140 Type 2 diabetes mellitus with diabetic neuropathy, unspecified: Secondary | ICD-10-CM | POA: Diagnosis not present

## 2018-11-05 DIAGNOSIS — E782 Mixed hyperlipidemia: Secondary | ICD-10-CM | POA: Diagnosis not present

## 2018-11-05 DIAGNOSIS — R944 Abnormal results of kidney function studies: Secondary | ICD-10-CM | POA: Diagnosis not present

## 2018-11-07 ENCOUNTER — Telehealth: Payer: Self-pay | Admitting: Cardiology

## 2018-11-07 NOTE — Telephone Encounter (Signed)
Will forward to Dr.Branch to get approval to switch MD'S

## 2018-11-07 NOTE — Telephone Encounter (Signed)
Ok with me  J [name] Khaimov MD 

## 2018-11-07 NOTE — Telephone Encounter (Signed)
New message      Pt last seen Dr. Harl Bowie in [place] location back on 08/14/17. He now wants to see DR. Hochrein  in [place] due to location being closer to him.  He seen him more than 3 years ago.

## 2018-11-12 DIAGNOSIS — M75111 Incomplete rotator cuff tear or rupture of right shoulder, not specified as traumatic: Secondary | ICD-10-CM | POA: Diagnosis not present

## 2018-11-26 NOTE — Telephone Encounter (Signed)
  Patient wants to switch from Dr Harl Bowie to Dr Percival Spanish. Dr Harl Bowie has said yes to the switch. Just need Dr Hochrein's approval.

## 2018-11-26 NOTE — Telephone Encounter (Signed)
Pt states he would like to be seen in the [place] office. Will route to scheduler to contact pt and set up appointment.

## 2018-11-26 NOTE — Telephone Encounter (Signed)
OK.  Where?  [place]?

## 2018-12-02 ENCOUNTER — Encounter: Payer: Self-pay | Admitting: Cardiology

## 2018-12-02 DIAGNOSIS — E1165 Type 2 diabetes mellitus with hyperglycemia: Secondary | ICD-10-CM | POA: Diagnosis not present

## 2018-12-02 DIAGNOSIS — D649 Anemia, unspecified: Secondary | ICD-10-CM | POA: Diagnosis not present

## 2018-12-02 DIAGNOSIS — R944 Abnormal results of kidney function studies: Secondary | ICD-10-CM | POA: Diagnosis not present

## 2018-12-02 DIAGNOSIS — I10 Essential (primary) hypertension: Secondary | ICD-10-CM | POA: Diagnosis not present

## 2018-12-02 DIAGNOSIS — E782 Mixed hyperlipidemia: Secondary | ICD-10-CM | POA: Diagnosis not present

## 2018-12-02 DIAGNOSIS — E1142 Type 2 diabetes mellitus with diabetic polyneuropathy: Secondary | ICD-10-CM | POA: Diagnosis not present

## 2018-12-02 DIAGNOSIS — K7581 Nonalcoholic steatohepatitis (NASH): Secondary | ICD-10-CM | POA: Diagnosis not present

## 2018-12-16 ENCOUNTER — Encounter: Payer: Self-pay | Admitting: Cardiology

## 2018-12-16 NOTE — Progress Notes (Signed)
Cardiology Office Note   Date:  12/18/2018   ID:  [name], DOB July 26, 1956, MRN [id number]  PCP:  Celene Squibb, MD  Cardiologist:   Kate Sable, MD  Chief Complaint  Patient presents with  . Coronary Artery Disease      History of Present Illness: [name] is a 63 y.o. male who presents for follow up of CAD.  He previously was seen by Dr. Harl Bowie but he lives up here and is changing his care for convenience.  He had bypass in 2011.  He is done very well since that time.  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  He walks at the gym and outside.  He has none of the back discomfort or chest discomfort that was his previous angina. The patient denies any new symptoms such as chest discomfort, neck or arm discomfort. There has been no new shortness of breath, PND or orthopnea. There have been no reported palpitations, presyncope or syncope.    Past Medical History:  Diagnosis Date  . Abnormal LFTs   . Adenomatous colon polyp 2012  . Allergy    to Red Meat  . Anxiety   . Arteriosclerotic cardiovascular disease (ASCVD) 2003   Non-Q MI with stent to RCA in 12/2001, normal EF, additional stents to mid CX and D1; cath in 02/2005-progressive or eye disease, 50-70% mid LAD, 90% distal LAD, 50% D1, 60-70% CX, small PDA with diffuse disease, patent RCA stent; repeat cath in 01/2008-no significant progression; non-Q MI in 02/2010 with 100% mid Cx at  prior stent, nondom. RCA with patent stent, 50% RI, nl EF, CABG-04/2010  . Chronic kidney disease    Creatinine of 1.44 in 04/2009  . Chronic obstructive pulmonary disease ([place])   . Coronary artery disease    stent 2011  . Diabetes mellitus   . Erectile dysfunction   . Full dentures   . Gastroesophageal reflux disease   . Hepatic steatosis   . Hyperlipidemia    Predominantly elevated triglycerides  . Hypertension   . Internal hemorrhoids   . Myocardial  infarction ([place]) 2002, 2011  . Neuropathy    feet  . Pneumonia 2006   2006  . Tobacco abuse, in remission    35 pack years; discontinued 05/2010    Past Surgical History:  Procedure Laterality Date  . APPENDECTOMY  1980  . CATARACT EXTRACTION W/PHACO Left 09/21/2014   Procedure: CATARACT EXTRACTION PHACO AND INTRAOCULAR LENS PLACEMENT (IOC);  Surgeon: Tonny Branch, MD;  Location: [facility];  Service: Ophthalmology;  Laterality: Left;  CDE:9.77  . COLONOSCOPY W/ POLYPECTOMY  2012   Dr. Olevia Perches; 2 polyps removed one of which was precancerous  . CORONARY ARTERY BYPASS GRAFT  05/2010   4 vessels August 2011  . EYE SURGERY    . HEMORRHOID SURGERY N/A 02/24/2014   Procedure: HEMORRHOIDECTOMY;  Surgeon: Joyice Faster. Cornett, MD;  Location: [facility];  Service: General;  Laterality: N/A;  . SHOULDER ARTHROSCOPY WITH OPEN ROTATOR CUFF REPAIR AND DISTAL CLAVICLE ACROMINECTOMY Left 11/23/2015   Procedure: SHOULDER ARTHROSCOPY WITH MINI-OPEN ROTATOR CUFF REPAIR, DISTAL CLAVICLE RESECTION AND SUBACROMIAL DECOMPRESSION.;  Surgeon: Garald Balding, MD;  Location: [facility];  Service: Orthopedics;  Laterality: Left;  LEFT SHOULDER ARTHROSCOPIC SUBACROMIAL DECOMPRESSION, DISTAL CLAVICLE RESECTION, MINI-OPEN ROTATOR CUFF REPAIR.     Current Outpatient Medications  Medication Sig Dispense Refill  . Alpha-Lipoic  Acid 600 MG CAPS Take by mouth daily.    Marland Kitchen amLODipine (NORVASC) 10 MG tablet Take 1 tablet (10 mg total) daily by mouth. 90 tablet 3  . aspirin EC 81 MG tablet Take 81 mg by mouth daily.    Marland Kitchen atorvastatin (LIPITOR) 40 MG tablet TAKE 1 TABLET BY MOUTH DAILY AT 6 PM. (Patient taking differently: Take 40 mg by mouth daily at 6 PM. TAKE 1 TABLET BY MOUTH DAILY AT 6 PM.) 90 tablet 3  . carvedilol (COREG) 3.125 MG tablet Take 3.125 mg by mouth 2 (two) times daily with a meal.     . diphenhydrAMINE (BENADRYL) 25 MG tablet Take 25 mg by mouth daily.    Marland Kitchen EPINEPHrine 0.3 mg/0.3 mL IJ SOAJ injection  Inject 0.3 mLs (0.3 mg total) into the muscle as needed. Reported on 04/14/2016 1 Device 11  . HUMALOG KWIKPEN 100 UNIT/ML KiwkPen INJECT 10 UNITS UNDER THE SKIN AT THE BEGINNING OF EVENING MEAL ONCE DAILY  4  . metFORMIN (GLUCOPHAGE) 1000 MG tablet Take 1,000 mg by mouth 2 (two) times daily with a meal.      . Multiple Vitamins-Minerals (MULTIVITAMIN WITH MINERALS) tablet Take 1 tablet by mouth daily.      . TRESIBA FLEXTOUCH 200 UNIT/ML SOPN Inject 100 Units into the skin daily.  3   Current Facility-Administered Medications  Medication Dose Route Frequency Provider Last Rate Last Dose  . EPINEPHrine (EPI-PEN) injection 0.3 mg  0.3 mg Intramuscular Once Particia Nearing S, PA-C        Allergies:   Ace inhibitors; Lisinopril; Beef-derived products; Other; Ranexa [ranolazine]; and Wellbutrin [bupropion hcl]    ROS:  Please see the history of present illness.   Otherwise, review of systems are positive for none.   All other systems are reviewed and negative.    PHYSICAL EXAM: VS:  BP 138/62   Pulse 71   Ht 5\' 5"  (1.651 m)   Wt 186 lb (84.4 kg)   BMI 30.95 kg/m  , BMI Body mass index is 30.95 kg/m. GENERAL:  Well appearing HEENT:  Pupils equal round and reactive, fundi not visualized, oral mucosa unremarkable NECK:  No jugular venous distention, waveform within normal limits, carotid upstroke brisk and symmetric, no bruits, no thyromegaly LYMPHATICS:  No cervical, inguinal adenopathy LUNGS:  Clear to auscultation bilaterally BACK:  No CVA tenderness CHEST:  Well healed sternotomy scar. HEART:  PMI not displaced or sustained,S1 and S2 within normal limits, no S3, no S4, no clicks, no rubs, no murmurs ABD:  Flat, positive bowel sounds normal in frequency in pitch, no bruits, no rebound, no guarding, no midline pulsatile mass, no hepatomegaly, no splenomegaly EXT:  2 plus pulses throughout, no edema, no cyanosis no clubbing SKIN:  No rashes no nodules NEURO:  Cranial nerves II through XII  grossly intact, motor grossly intact throughout PSYCH:  Cognitively intact, oriented to person place and time    EKG:  EKG is ordered today. The ekg ordered today demonstrates sinus rhythm, rate 71, axis within normal limits, intervals within normal limits, no acute ST-T wave changes.   Recent Labs: No results found for requested labs within last 8760 hours.    Lipid Panel    Component Value Date/Time   CHOL 140 02/04/2010 0000   TRIG 313 (H) 02/04/2010 0000   HDL 30 (L) 02/04/2010 0000   CHOLHDL 4.7 Ratio 02/04/2010 0000   VLDL 63 (H) 02/04/2010 0000   LDLCALC 47 02/04/2010 0000  Wt Readings from Last 3 Encounters:  12/18/18 186 lb (84.4 kg)  10/25/17 178 lb (80.7 kg)  10/12/17 173 lb 6.4 oz (78.7 kg)      Other studies Reviewed: Additional studies/ records that were reviewed today include: Labs. Review of the above records demonstrates:  Please see elsewhere in the note.     ASSESSMENT AND PLAN:  Palpitations He has no symptoms.  No testing is indicated.  He had these previously.  CAD He had coronary disease as described with CABG.  However, he practices great secondary risk reduction and he is very very active and has no symptoms so I do not think further testing is indicated.  We will continue with risk reduction.  HTN Blood pressures well controlled.  He had anaphylaxis with ACE inhibitors and we added this to his allergies.  He will continue with the meds as listed.  Hyperlipidemia   He tolerates statins and has target lipids with an LDL of 32.  No change in therapy.  DM His A1c most recently was 8.3.  He is followed by an endocrinologist and I would defer to their management although I would suggest consideration of SGLT2i or GLP1ra.  The patient says that his A1c was always below 7.  He understands the targets and the reason to consider the new medications but would prefer to talk to his endocrinologist.  Current medicines are reviewed at length  with the patient today.  The patient does not have concerns regarding medicines.  The following changes have been made:  no change  Labs/ tests ordered today include: None  Orders Placed This Encounter  Procedures  . EKG 12-Lead     Disposition:   FU with in on year or sooner if needed.     Signed, Minus Breeding, MD  12/18/2018 11:17 AM    [place]

## 2018-12-18 ENCOUNTER — Ambulatory Visit: Payer: 59 | Admitting: Cardiology

## 2018-12-18 ENCOUNTER — Encounter: Payer: Self-pay | Admitting: Cardiology

## 2018-12-18 ENCOUNTER — Other Ambulatory Visit: Payer: Self-pay

## 2018-12-18 VITALS — BP 138/62 | HR 71 | Ht 65.0 in | Wt 186.0 lb

## 2018-12-18 DIAGNOSIS — I10 Essential (primary) hypertension: Secondary | ICD-10-CM

## 2018-12-18 DIAGNOSIS — R002 Palpitations: Secondary | ICD-10-CM

## 2018-12-18 DIAGNOSIS — I2510 Atherosclerotic heart disease of native coronary artery without angina pectoris: Secondary | ICD-10-CM | POA: Insufficient documentation

## 2018-12-18 DIAGNOSIS — E1169 Type 2 diabetes mellitus with other specified complication: Secondary | ICD-10-CM

## 2018-12-18 DIAGNOSIS — E782 Mixed hyperlipidemia: Secondary | ICD-10-CM | POA: Diagnosis not present

## 2018-12-18 DIAGNOSIS — Z794 Long term (current) use of insulin: Secondary | ICD-10-CM

## 2018-12-18 NOTE — Patient Instructions (Signed)
Medication Instructions:  The current medical regimen is effective;  continue present plan and medications.  If you need a refill on your cardiac medications before your next appointment, please call your pharmacy.   Follow-Up: Follow up in 1 year with Dr. Hochrein in [place].  You will receive a letter in the mail 2 months before you are due.  Please call us when you receive this letter to schedule your follow up appointment.  Thank you for choosing [facility]!!     

## 2019-01-09 DIAGNOSIS — L237 Allergic contact dermatitis due to plants, except food: Secondary | ICD-10-CM | POA: Diagnosis not present

## 2019-02-03 DIAGNOSIS — E1142 Type 2 diabetes mellitus with diabetic polyneuropathy: Secondary | ICD-10-CM | POA: Diagnosis not present

## 2019-02-03 DIAGNOSIS — G629 Polyneuropathy, unspecified: Secondary | ICD-10-CM | POA: Diagnosis not present

## 2019-02-03 DIAGNOSIS — I10 Essential (primary) hypertension: Secondary | ICD-10-CM | POA: Diagnosis not present

## 2019-02-03 DIAGNOSIS — R944 Abnormal results of kidney function studies: Secondary | ICD-10-CM | POA: Diagnosis not present

## 2019-05-12 ENCOUNTER — Encounter: Payer: Self-pay | Admitting: Nurse Practitioner

## 2019-05-22 ENCOUNTER — Ambulatory Visit: Payer: BC Managed Care – PPO | Admitting: Cardiology

## 2019-06-03 ENCOUNTER — Encounter: Payer: Self-pay | Admitting: Nurse Practitioner

## 2019-06-03 ENCOUNTER — Other Ambulatory Visit (INDEPENDENT_AMBULATORY_CARE_PROVIDER_SITE_OTHER): Payer: BC Managed Care – PPO

## 2019-06-03 ENCOUNTER — Ambulatory Visit: Payer: BC Managed Care – PPO | Admitting: Nurse Practitioner

## 2019-06-03 ENCOUNTER — Other Ambulatory Visit: Payer: Self-pay

## 2019-06-03 VITALS — BP 152/70 | HR 73 | Temp 98.1°F | Ht 65.0 in | Wt 193.0 lb

## 2019-06-03 DIAGNOSIS — R1310 Dysphagia, unspecified: Secondary | ICD-10-CM | POA: Diagnosis not present

## 2019-06-03 DIAGNOSIS — D509 Iron deficiency anemia, unspecified: Secondary | ICD-10-CM

## 2019-06-03 LAB — CBC
HCT: 34.2 % — ABNORMAL LOW (ref 39.0–52.0)
Hemoglobin: 10.9 g/dL — ABNORMAL LOW (ref 13.0–17.0)
MCHC: 31.8 g/dL (ref 30.0–36.0)
MCV: 74.5 fl — ABNORMAL LOW (ref 78.0–100.0)
Platelets: 260 10*3/uL (ref 150.0–400.0)
RBC: 4.59 Mil/uL (ref 4.22–5.81)
RDW: 17.2 % — ABNORMAL HIGH (ref 11.5–15.5)
WBC: 7.5 10*3/uL (ref 4.0–10.5)

## 2019-06-03 LAB — IBC + FERRITIN
Ferritin: 36.9 ng/mL (ref 22.0–322.0)
Iron: 39 ug/dL — ABNORMAL LOW (ref 42–165)
Saturation Ratios: 12.1 % — ABNORMAL LOW (ref 20.0–50.0)
Transferrin: 230 mg/dL (ref 212.0–360.0)

## 2019-06-03 NOTE — Patient Instructions (Signed)
If you are age 65 or older, your body mass index should be between 23-30. Your Body mass index is 32.12 kg/m. If this is out of the aforementioned range listed, please consider follow up with your Primary Care Provider.  If you are age 16 or younger, your body mass index should be between 19-25. Your Body mass index is 32.12 kg/m. If this is out of the aformentioned range listed, please consider follow up with your Primary Care Provider.   You have been scheduled for an endoscopy. Please follow written instructions given to you at your visit today. If you use inhalers (even only as needed), please bring them with you on the day of your procedure. Your physician has requested that you go to www.startemmi.com and enter the access code given to you at your visit today. This web site gives a general overview about your procedure. However, you should still follow specific instructions given to you by our office regarding your preparation for the procedure.  Your provider has requested that you go to the basement level for lab work before leaving today. Press "B" on the elevator. The lab is located at the first door on the left as you exit the elevator.  Thank you for choosing me and [facility].   Tye Savoy, NP

## 2019-06-03 NOTE — Progress Notes (Signed)
Chief Complaint:    dysphagia  IMPRESSION and PLAN:    40.  63 year old male with progression of chronic dysphagia over the last few months.  No associated weight loss.  Patient gives a history of having esophagus stretched in 2003.  Rule out stricture. -Further evaluation patient will be scheduled for EGD with esophageal dilation. The risks and benefits of EGD were discussed and the patient agrees to proceed.  -Advised patient to eat small bites, chew well with liquids in between bites to avoid food impaction.  2. Hx of microcytic anemia. PCP office notes from Feb document hgb of 11.8 with MCV of 76. On iron supplement at home. No overt GI blood loss.  -check ferritin, TIBC, CBC  3. Hx of colon polyps,  surveillance colonoscopy due July 2022  HPI:     Patient is a 63 year old male with PMH significant for DM 2, polyneuropathy, CAD, and hypertension. He was previously followed by Dr. Olevia Perches, now established with Dr. Loletha Carrow.  Patient has a history of colon polyps (sessile serrated in 2012, / tubular adenomas < 2 mm July 2017).  He is due for surveillance colonoscopy July 2022.  Mr. Waldner comes in for evaluation of dysphagia.  He gives a very longstanding history of occasional dysphagia.  Says his esophagus was stretched by Dr. Laural Golden around year 2003.  While episodes of solid food dysphagia have been very occasional since then, over the last 3 to 4 months he has had numerous episodes.  Meat and bread are the most problematic.  He sometimes has to purge himself with his finger to get the food up.  His weight is stable.   Patient has a longstanding history of GERD manifested mainly as regurgitation. Symptoms controlled as long as he takes omeprazole every day. His weight has been stable.  No other GI complaints.  In anticipation of this visit we got PCP's last office notes from February 2020.  At that time patient was noted to have microcytic deficiency with a hemoglobin around 11.8, MCV  76. This was apparently not a new problem as plan was to  "continue"  iron supplement   Review of systems:     No chest pain, no SOB, no fevers, no urinary sx   Past Medical History:  Diagnosis Date   Abnormal LFTs    Adenomatous colon polyp 2012   Allergy    to Red Meat   Anxiety    Arteriosclerotic cardiovascular disease (ASCVD) 2003   Non-Q MI with stent to RCA in 12/2001, normal EF, additional stents to mid CX and D1; cath in 02/2005-progressive or eye disease, 50-70% mid LAD, 90% distal LAD, 50% D1, 60-70% CX, small PDA with diffuse disease, patent RCA stent; repeat cath in 01/2008-no significant progression; non-Q MI in 02/2010 with 100% mid Cx at  prior stent, nondom. RCA with patent stent, 50% RI, nl EF, CABG-04/2010   Chronic kidney disease    Creatinine of 1.44 in 04/2009   Chronic obstructive pulmonary disease ([facility])    Coronary artery disease    stent 2011   Diabetes mellitus    Erectile dysfunction    Full dentures    Gastroesophageal reflux disease    Hepatic steatosis    Hyperlipidemia    Predominantly elevated triglycerides   Hypertension    Internal hemorrhoids    Myocardial infarction ([place]) 2002, 2011   Neuropathy    feet   Pneumonia 2006   2006   Tobacco abuse, in  remission    35 pack years; discontinued 05/2010    Patient's surgical history, family medical history, social history, medications and allergies were all reviewed in Epic   Creatinine clearance cannot be calculated (Patient's most recent lab result is older than the maximum 21 days allowed.)  Current Outpatient Medications  Medication Sig Dispense Refill   Alpha-Lipoic Acid 600 MG CAPS Take by mouth daily.     amLODipine (NORVASC) 10 MG tablet Take 1 tablet (10 mg total) daily by mouth. 90 tablet 3   aspirin EC 81 MG tablet Take 81 mg by mouth daily.     atorvastatin (LIPITOR) 40 MG tablet TAKE 1 TABLET BY MOUTH DAILY AT 6 PM. (Patient taking differently: Take 40 mg by mouth  daily at 6 PM. TAKE 1 TABLET BY MOUTH DAILY AT 6 PM.) 90 tablet 3   diphenhydrAMINE (BENADRYL) 25 MG tablet Take 25 mg by mouth daily.     EPINEPHrine 0.3 mg/0.3 mL IJ SOAJ injection Inject 0.3 mLs (0.3 mg total) into the muscle as needed. Reported on 04/14/2016 1 Device 11   HUMALOG KWIKPEN 100 UNIT/ML KiwkPen INJECT 10 UNITS UNDER THE SKIN AT THE BEGINNING OF EVENING MEAL ONCE DAILY  4   metFORMIN (GLUCOPHAGE) 1000 MG tablet Take 1,000 mg by mouth 2 (two) times daily with a meal.       Multiple Vitamins-Minerals (MULTIVITAMIN WITH MINERALS) tablet Take 1 tablet by mouth daily.       TRESIBA FLEXTOUCH 200 UNIT/ML SOPN Inject 100 Units into the skin daily.  3   TURMERIC PO Take 1 tablet by mouth daily.     Current Facility-Administered Medications  Medication Dose Route Frequency Provider Last Rate Last Dose   EPINEPHrine (EPI-PEN) injection 0.3 mg  0.3 mg Intramuscular Once Terald Sleeper, PA-C        Physical Exam:     BP (!) 152/70    Pulse 73    Temp 98.1 F (36.7 C)    Ht 5\' 5"  (1.651 m)    Wt 193 lb (87.5 kg)    BMI 32.12 kg/m   GENERAL:  Pleasant male in NAD PSYCH: : Cooperative, normal affect EENT:  conjunctiva pink, mucous membranes moist, neck supple without masses CARDIAC:  RRR, murmur heard, no peripheral edema PULM: Normal respiratory effort, lungs CTA bilaterally, no wheezing ABDOMEN:  Nondistended, soft, nontender. No obvious masses, no hepatomegaly,  normal bowel sounds SKIN:  turgor, no lesions seen Musculoskeletal:  Normal muscle tone, normal strength NEURO: Alert and oriented x 3, no focal neurologic deficits   Tye Savoy , NP 06/03/2019, 3:01 PM

## 2019-06-03 NOTE — Progress Notes (Signed)
____________________________________________________________  Attending physician addendum:  Thank you for sending this case to me. I have reviewed the entire note, and the outlined plan seems appropriate.  He is on my endoscopy schedule for 06/11/19  Wilfrid Lund, MD  ____________________________________________________________

## 2019-06-06 ENCOUNTER — Encounter: Payer: Self-pay | Admitting: Gastroenterology

## 2019-06-10 ENCOUNTER — Telehealth: Payer: Self-pay

## 2019-06-10 NOTE — Telephone Encounter (Signed)
Covid-19 screening questions   Do you now or have you had a fever in the last 14 days?  Do you have any respiratory symptoms of shortness of breath or cough now or in the last 14 days?  Do you have any family members or close contacts with diagnosed or suspected Covid-19 in the past 14 days?  Have you been tested for Covid-19 and found to be positive?       

## 2019-06-10 NOTE — Telephone Encounter (Signed)
Pt answered "NO" to all questions °

## 2019-06-11 ENCOUNTER — Ambulatory Visit (AMBULATORY_SURGERY_CENTER): Payer: BC Managed Care – PPO | Admitting: Gastroenterology

## 2019-06-11 ENCOUNTER — Encounter: Payer: Self-pay | Admitting: Gastroenterology

## 2019-06-11 ENCOUNTER — Other Ambulatory Visit: Payer: Self-pay

## 2019-06-11 VITALS — BP 123/70 | HR 65 | Temp 97.8°F | Resp 16 | Ht 65.0 in | Wt 193.0 lb

## 2019-06-11 DIAGNOSIS — K222 Esophageal obstruction: Secondary | ICD-10-CM

## 2019-06-11 DIAGNOSIS — R1310 Dysphagia, unspecified: Secondary | ICD-10-CM

## 2019-06-11 DIAGNOSIS — R1319 Other dysphagia: Secondary | ICD-10-CM

## 2019-06-11 MED ORDER — SODIUM CHLORIDE 0.9 % IV SOLN: 500.0000 mL | Freq: Once | INTRAVENOUS | Status: DC

## 2019-06-11 NOTE — Progress Notes (Signed)
A/ox3, pleased with MAC, report to RN 

## 2019-06-11 NOTE — Patient Instructions (Signed)
Handout given for stricture.  Start off with soft food today to make sure you can swallow well before advancing to a regular diet.  YOU HAD AN ENDOSCOPIC PROCEDURE TODAY AT [facility]:   Refer to the procedure report that was given to you for any specific questions about what was found during the examination.  If the procedure report does not answer your questions, please call your gastroenterologist to clarify.  If you requested that your care partner not be given the details of your procedure findings, then the procedure report has been included in a sealed envelope for you to review at your convenience later.  YOU SHOULD EXPECT: Some feelings of bloating in the abdomen. Passage of more gas than usual.  Walking can help get rid of the air that was put into your GI tract during the procedure and reduce the bloating. If you had a lower endoscopy (such as a colonoscopy or flexible sigmoidoscopy) you may notice spotting of blood in your stool or on the toilet paper. If you underwent a bowel prep for your procedure, you may not have a normal bowel movement for a few days.  Please Note:  You might notice some irritation and congestion in your nose or some drainage.  This is from the oxygen used during your procedure.  There is no need for concern and it should clear up in a day or so.  SYMPTOMS TO REPORT IMMEDIATELY:   Following upper endoscopy (EGD)  Vomiting of blood or coffee ground material  New chest pain or pain under the shoulder blades  Painful or persistently difficult swallowing  New shortness of breath  Fever of 100F or higher  Black, tarry-looking stools  For urgent or emergent issues, a gastroenterologist can be reached at any hour by calling [phone].   DIET:  We do recommend a small meal at first, but then you may proceed to your regular diet.  Drink plenty of fluids but you should avoid alcoholic beverages for 24 hours.  ACTIVITY:  You should plan to take  it easy for the rest of today and you should NOT DRIVE or use heavy machinery until tomorrow (because of the sedation medicines used during the test).    FOLLOW UP: Our staff will call the number listed on your records 48-72 hours following your procedure to check on you and address any questions or concerns that you may have regarding the information given to you following your procedure. If we do not reach you, we will leave a message.  We will attempt to reach you two times.  During this call, we will ask if you have developed any symptoms of COVID 19. If you develop any symptoms (ie: fever, flu-like symptoms, shortness of breath, cough etc.) before then, please call [phone].  If you test positive for Covid 19 in the 2 weeks post procedure, please call and report this information to [place].    If any biopsies were taken you will be contacted by phone or by letter within the next 1-3 weeks.  Please call us at [phone] if you have not heard about the biopsies in 3 weeks.    SIGNATURES/CONFIDENTIALITY: You and/or your care partner have signed paperwork which will be entered into your electronic medical record.  These signatures attest to the fact that that the information above on your After Visit Summary has been reviewed and is understood.  Full responsibility of the confidentiality of this discharge information lies with you and/or  your care-partner. 

## 2019-06-11 NOTE — Progress Notes (Signed)
[name] - [name] - VS   

## 2019-06-11 NOTE — Progress Notes (Signed)
Called to room to assist during endoscopic procedure.  Patient ID and intended procedure confirmed with present staff. Received instructions for my participation in the procedure from the performing physician.  

## 2019-06-11 NOTE — Op Note (Signed)
[facility] Patient Name: [name] Procedure Date: 06/11/2019 9:51 AM MRN: [id number] Endoscopist: Mallie Mussel L. Loletha Carrow , MD Age: 63 Referring MD:  Date of Birth: 06-Apr-1956 Gender: Male Account #: [id number] Procedure:                Upper GI endoscopy Indications:              Esophageal dysphagia Medicines:                Monitored Anesthesia Care Procedure:                Pre-Anesthesia Assessment:                           - Prior to the procedure, a History and Physical                            was performed, and patient medications and                            allergies were reviewed. The patient's tolerance of                            previous anesthesia was also reviewed. The risks                            and benefits of the procedure and the sedation                            options and risks were discussed with the patient.                            All questions were answered, and informed consent                            was obtained. Prior Anticoagulants: The patient has                            taken no previous anticoagulant or antiplatelet                            agents except for aspirin. ASA Grade Assessment:                            III - A patient with severe systemic disease. After                            reviewing the risks and benefits, the patient was                            deemed in satisfactory condition to undergo the                            procedure.  After obtaining informed consent, the endoscope was                            passed under direct vision. Throughout the                            procedure, the patient's blood pressure, pulse, and                            oxygen saturations were monitored continuously. The                            Endoscope was introduced through the mouth, and                            advanced to the second part of duodenum. The upper                GI endoscopy was accomplished without difficulty.                            The patient tolerated the procedure well. Scope In: Scope Out: Findings:                 One benign-appearing, intrinsic moderate stenosis                            was found at the gastroesophageal junction. This                            stenosis measured 1.2 cm (inner diameter) x less                            than one cm (in length). The stenosis was                            traversed. A TTS dilator was passed through the                            scope. Dilation with a 16-17-18 mm balloon dilator                            was performed to 18 mm. The dilation site was                            examined and showed moderate mucosal disruption and                            moderate improvement in luminal narrowing.                           The exam of the esophagus was otherwise normal.                           The stomach was normal.  The cardia and gastric fundus were normal on                            retroflexion.                           The examined duodenum was normal. Complications:            No immediate complications. Estimated Blood Loss:     Estimated blood loss was minimal. Impression:               - Benign-appearing esophageal stenosis.                           - Normal stomach.                           - Normal examined duodenum.                           - No specimens collected. Recommendation:           - Patient has a contact number available for                            emergencies. The signs and symptoms of potential                            delayed complications were discussed with the                            patient. Return to normal activities tomorrow.                            Written discharge instructions were provided to the                            patient.                           - Resume previous diet.                            - Continue present medications. Henry L. Loletha Carrow, MD 06/11/2019 10:20:15 AM This report has been signed electronically.

## 2019-06-13 ENCOUNTER — Telehealth: Payer: Self-pay | Admitting: *Deleted

## 2019-06-13 NOTE — Telephone Encounter (Signed)
First follow up call attempt.  Reached voicemail, left message to call if any questions or concerns regarding procedure or COVID19.

## 2019-06-13 NOTE — Telephone Encounter (Signed)
  Follow up Call-  Call back number 06/11/2019  Post procedure Call Back phone  # [phone] cell  Permission to leave phone message Yes  Some recent data might be hidden     Patient questions:  Do you have a fever, pain , or abdominal swelling? No. Pain Score  0 *  Have you tolerated food without any problems? Yes.    Have you been able to return to your normal activities? Yes.    Do you have any questions about your discharge instructions: Diet   No. Medications  No. Follow up visit  No.  Do you have questions or concerns about your Care? No.  Actions: * If pain score is 4 or above: No action needed, pain <4.  1. Have you developed a fever since your procedure? no  2.   Have you had an respiratory symptoms (SOB or cough) since your procedure? no  3.   Have you tested positive for COVID 19 since your procedure no  4.   Have you had any family members/close contacts diagnosed with the COVID 19 since your procedure?  no   If yes to any of these questions please route to Joylene John, RN and Alphonsa Gin, Therapist, sports.

## 2019-06-20 DIAGNOSIS — E782 Mixed hyperlipidemia: Secondary | ICD-10-CM | POA: Diagnosis not present

## 2019-06-20 DIAGNOSIS — E1142 Type 2 diabetes mellitus with diabetic polyneuropathy: Secondary | ICD-10-CM | POA: Diagnosis not present

## 2019-06-20 DIAGNOSIS — E1165 Type 2 diabetes mellitus with hyperglycemia: Secondary | ICD-10-CM | POA: Diagnosis not present

## 2019-06-20 DIAGNOSIS — I10 Essential (primary) hypertension: Secondary | ICD-10-CM | POA: Diagnosis not present

## 2019-06-25 DIAGNOSIS — E1140 Type 2 diabetes mellitus with diabetic neuropathy, unspecified: Secondary | ICD-10-CM | POA: Diagnosis not present

## 2019-06-25 DIAGNOSIS — I10 Essential (primary) hypertension: Secondary | ICD-10-CM | POA: Diagnosis not present

## 2019-06-25 DIAGNOSIS — R944 Abnormal results of kidney function studies: Secondary | ICD-10-CM | POA: Diagnosis not present

## 2019-06-25 DIAGNOSIS — Z23 Encounter for immunization: Secondary | ICD-10-CM | POA: Diagnosis not present

## 2019-09-22 DIAGNOSIS — Z8679 Personal history of other diseases of the circulatory system: Secondary | ICD-10-CM | POA: Diagnosis not present

## 2019-09-22 DIAGNOSIS — D509 Iron deficiency anemia, unspecified: Secondary | ICD-10-CM | POA: Diagnosis not present

## 2019-09-22 DIAGNOSIS — E782 Mixed hyperlipidemia: Secondary | ICD-10-CM | POA: Diagnosis not present

## 2019-09-22 DIAGNOSIS — E1142 Type 2 diabetes mellitus with diabetic polyneuropathy: Secondary | ICD-10-CM | POA: Diagnosis not present

## 2019-09-22 DIAGNOSIS — R109 Unspecified abdominal pain: Secondary | ICD-10-CM | POA: Diagnosis not present

## 2019-09-22 DIAGNOSIS — E1165 Type 2 diabetes mellitus with hyperglycemia: Secondary | ICD-10-CM | POA: Diagnosis not present

## 2019-09-22 DIAGNOSIS — I10 Essential (primary) hypertension: Secondary | ICD-10-CM | POA: Diagnosis not present

## 2019-09-22 DIAGNOSIS — R809 Proteinuria, unspecified: Secondary | ICD-10-CM | POA: Diagnosis not present

## 2019-09-22 DIAGNOSIS — K222 Esophageal obstruction: Secondary | ICD-10-CM | POA: Diagnosis not present

## 2019-09-22 DIAGNOSIS — R1011 Right upper quadrant pain: Secondary | ICD-10-CM | POA: Diagnosis not present

## 2019-09-22 DIAGNOSIS — F5101 Primary insomnia: Secondary | ICD-10-CM | POA: Diagnosis not present

## 2019-09-25 DIAGNOSIS — E782 Mixed hyperlipidemia: Secondary | ICD-10-CM | POA: Diagnosis not present

## 2019-09-25 DIAGNOSIS — F419 Anxiety disorder, unspecified: Secondary | ICD-10-CM | POA: Diagnosis not present

## 2019-09-25 DIAGNOSIS — D649 Anemia, unspecified: Secondary | ICD-10-CM | POA: Diagnosis not present

## 2019-09-25 DIAGNOSIS — E1140 Type 2 diabetes mellitus with diabetic neuropathy, unspecified: Secondary | ICD-10-CM | POA: Diagnosis not present

## 2019-09-25 DIAGNOSIS — I10 Essential (primary) hypertension: Secondary | ICD-10-CM | POA: Diagnosis not present

## 2019-09-25 DIAGNOSIS — R944 Abnormal results of kidney function studies: Secondary | ICD-10-CM | POA: Diagnosis not present

## 2019-09-25 DIAGNOSIS — G9009 Other idiopathic peripheral autonomic neuropathy: Secondary | ICD-10-CM | POA: Diagnosis not present

## 2019-09-25 DIAGNOSIS — K7581 Nonalcoholic steatohepatitis (NASH): Secondary | ICD-10-CM | POA: Diagnosis not present

## 2019-09-25 DIAGNOSIS — K222 Esophageal obstruction: Secondary | ICD-10-CM | POA: Diagnosis not present

## 2019-09-25 DIAGNOSIS — Z23 Encounter for immunization: Secondary | ICD-10-CM | POA: Diagnosis not present

## 2019-09-25 DIAGNOSIS — I2510 Atherosclerotic heart disease of native coronary artery without angina pectoris: Secondary | ICD-10-CM | POA: Diagnosis not present

## 2019-09-25 DIAGNOSIS — R809 Proteinuria, unspecified: Secondary | ICD-10-CM | POA: Diagnosis not present

## 2019-10-09 ENCOUNTER — Other Ambulatory Visit: Payer: Self-pay

## 2019-10-09 ENCOUNTER — Encounter (HOSPITAL_COMMUNITY)
Admission: RE | Admit: 2019-10-09 | Discharge: 2019-10-09 | Disposition: A | Discharge status: Home / Self Care | Payer: PPO | Source: Ambulatory Visit | Attending: Internal Medicine | Admitting: Internal Medicine

## 2019-10-09 ENCOUNTER — Encounter (HOSPITAL_COMMUNITY): Payer: Self-pay

## 2019-10-09 DIAGNOSIS — D649 Anemia, unspecified: Secondary | ICD-10-CM | POA: Insufficient documentation

## 2019-10-09 MED ORDER — SODIUM CHLORIDE 0.9 % IV SOLN: Freq: Once | INTRAVENOUS | Status: AC

## 2019-10-09 MED ORDER — SODIUM CHLORIDE 0.9 % IV SOLN
750.0000 mg | Freq: Once | INTRAVENOUS | Status: AC
  Administered 2019-10-09: 750 mg via INTRAVENOUS
  Filled 2019-10-09: qty 15

## 2019-10-16 ENCOUNTER — Other Ambulatory Visit: Payer: Self-pay

## 2019-10-16 ENCOUNTER — Encounter (HOSPITAL_COMMUNITY)
Admission: RE | Admit: 2019-10-16 | Discharge: 2019-10-16 | Disposition: A | Discharge status: Home / Self Care | Payer: Medicare HMO | Source: Ambulatory Visit | Attending: Internal Medicine | Admitting: Internal Medicine

## 2019-10-16 ENCOUNTER — Encounter (HOSPITAL_COMMUNITY): Payer: Self-pay

## 2019-10-16 DIAGNOSIS — D509 Iron deficiency anemia, unspecified: Secondary | ICD-10-CM | POA: Insufficient documentation

## 2019-10-16 MED ORDER — SODIUM CHLORIDE 0.9 % IV SOLN: Freq: Once | INTRAVENOUS | Status: AC

## 2019-10-16 MED ORDER — SODIUM CHLORIDE 0.9 % IV SOLN
750.0000 mg | Freq: Once | INTRAVENOUS | Status: AC
  Administered 2019-10-16: 750 mg via INTRAVENOUS
  Filled 2019-10-16: qty 15

## 2019-10-29 ENCOUNTER — Ambulatory Visit: Payer: Medicare HMO | Attending: Internal Medicine

## 2019-10-29 DIAGNOSIS — Z23 Encounter for immunization: Secondary | ICD-10-CM | POA: Insufficient documentation

## 2019-10-29 NOTE — Progress Notes (Signed)
   Covid-19 Vaccination Clinic  Name:  [facility]    MRN: [id number] DOB: 02-25-1956  10/29/2019  Mr. Turrentine was observed post Covid-19 immunization for 15 minutes without incidence. He was provided with Vaccine Information Sheet and instruction to access the V-Safe system.   Mr. Laughlin was instructed to call 911 with any severe reactions post vaccine: Marland Kitchen Difficulty breathing  . Swelling of your face and throat  . A fast heartbeat  . A bad rash all over your body  . Dizziness and weakness    Immunizations Administered    Name Date Dose VIS Date Route   Pfizer COVID-19 Vaccine 10/29/2019  3:40 PM 0.3 mL 09/19/2019 Intramuscular   Manufacturer: [place] [address]   Lot: [id number]   [place]: [id number]

## 2019-11-19 ENCOUNTER — Ambulatory Visit: Payer: Medicare HMO | Attending: Internal Medicine

## 2019-11-19 DIAGNOSIS — Z23 Encounter for immunization: Secondary | ICD-10-CM | POA: Insufficient documentation

## 2019-11-19 NOTE — Progress Notes (Signed)
   Covid-19 Vaccination Clinic  Name:  [name]    MRN: [id number] DOB: 1956/07/19  11/19/2019  [name] was observed post Covid-19 immunization for 30 minutes based on pre-vaccination screening without incidence. He was provided with Vaccine Information Sheet and instruction to access the V-Safe system.   [name] was instructed to call 911 with any severe reactions post vaccine: Marland Kitchen Difficulty breathing  . Swelling of your face and throat  . A fast heartbeat  . A bad rash all over your body  . Dizziness and weakness    Immunizations Administered    Name Date Dose VIS Date Route   Pfizer COVID-19 Vaccine 11/19/2019  9:45 AM 0.3 mL 09/19/2019 Intramuscular   Manufacturer: [employer]   Lot: [id number]   [place]: [id number]

## 2019-12-06 ENCOUNTER — Ambulatory Visit: Payer: Medicare HMO

## 2020-01-05 DIAGNOSIS — L239 Allergic contact dermatitis, unspecified cause: Secondary | ICD-10-CM | POA: Diagnosis not present

## 2020-01-12 DIAGNOSIS — E1142 Type 2 diabetes mellitus with diabetic polyneuropathy: Secondary | ICD-10-CM | POA: Diagnosis not present

## 2020-01-12 DIAGNOSIS — F5101 Primary insomnia: Secondary | ICD-10-CM | POA: Diagnosis not present

## 2020-01-12 DIAGNOSIS — D509 Iron deficiency anemia, unspecified: Secondary | ICD-10-CM | POA: Diagnosis not present

## 2020-01-12 DIAGNOSIS — F17210 Nicotine dependence, cigarettes, uncomplicated: Secondary | ICD-10-CM | POA: Diagnosis not present

## 2020-01-12 DIAGNOSIS — E1140 Type 2 diabetes mellitus with diabetic neuropathy, unspecified: Secondary | ICD-10-CM | POA: Diagnosis not present

## 2020-01-12 DIAGNOSIS — F419 Anxiety disorder, unspecified: Secondary | ICD-10-CM | POA: Diagnosis not present

## 2020-01-12 DIAGNOSIS — D649 Anemia, unspecified: Secondary | ICD-10-CM | POA: Diagnosis not present

## 2020-01-12 DIAGNOSIS — E1165 Type 2 diabetes mellitus with hyperglycemia: Secondary | ICD-10-CM | POA: Diagnosis not present

## 2020-01-12 DIAGNOSIS — E782 Mixed hyperlipidemia: Secondary | ICD-10-CM | POA: Diagnosis not present

## 2020-01-15 DIAGNOSIS — E1140 Type 2 diabetes mellitus with diabetic neuropathy, unspecified: Secondary | ICD-10-CM | POA: Diagnosis not present

## 2020-01-15 DIAGNOSIS — I2510 Atherosclerotic heart disease of native coronary artery without angina pectoris: Secondary | ICD-10-CM | POA: Diagnosis not present

## 2020-01-15 DIAGNOSIS — E782 Mixed hyperlipidemia: Secondary | ICD-10-CM | POA: Diagnosis not present

## 2020-01-15 DIAGNOSIS — R809 Proteinuria, unspecified: Secondary | ICD-10-CM | POA: Diagnosis not present

## 2020-01-15 DIAGNOSIS — D649 Anemia, unspecified: Secondary | ICD-10-CM | POA: Diagnosis not present

## 2020-01-15 DIAGNOSIS — I10 Essential (primary) hypertension: Secondary | ICD-10-CM | POA: Diagnosis not present

## 2020-01-15 DIAGNOSIS — R944 Abnormal results of kidney function studies: Secondary | ICD-10-CM | POA: Diagnosis not present

## 2020-01-15 DIAGNOSIS — F419 Anxiety disorder, unspecified: Secondary | ICD-10-CM | POA: Diagnosis not present

## 2020-01-15 DIAGNOSIS — K7581 Nonalcoholic steatohepatitis (NASH): Secondary | ICD-10-CM | POA: Diagnosis not present

## 2020-01-15 DIAGNOSIS — K760 Fatty (change of) liver, not elsewhere classified: Secondary | ICD-10-CM | POA: Diagnosis not present

## 2020-01-15 DIAGNOSIS — K222 Esophageal obstruction: Secondary | ICD-10-CM | POA: Diagnosis not present

## 2020-01-15 DIAGNOSIS — Z23 Encounter for immunization: Secondary | ICD-10-CM | POA: Diagnosis not present

## 2020-01-15 DIAGNOSIS — Z0001 Encounter for general adult medical examination with abnormal findings: Secondary | ICD-10-CM | POA: Diagnosis not present

## 2020-01-16 ENCOUNTER — Telehealth: Payer: Self-pay

## 2020-01-16 NOTE — Telephone Encounter (Signed)
Notes on file, faxed to [place] office.

## 2020-01-27 DIAGNOSIS — Z7189 Other specified counseling: Secondary | ICD-10-CM | POA: Insufficient documentation

## 2020-01-27 NOTE — Progress Notes (Signed)
Cardiology Office Note   Date:  01/28/2020   ID:  [name], DOB Apr 07, 1956, MRN [id number]  PCP:  Celene Squibb, MD  Cardiologist:   Kate Sable, MD  Chief Complaint  Patient presents with  . Coronary Artery Disease      History of Present Illness: [name] is a 64 y.o. male who presents for follow up of CAD.   He had bypass in 2011.  Since I last saw him he has done well.  He walks every day.  The patient denies any new symptoms such as chest discomfort, neck or arm discomfort. There has been no new shortness of breath, PND or orthopnea. There have been no reported palpitations, presyncope or syncope.  He does yard work.  He is not going to the gym since Covid.  Past Medical History:  Diagnosis Date  . Abnormal LFTs   . Adenomatous colon polyp 2012  . Allergy    to Red Meat  . Anemia   . Anxiety   . Arteriosclerotic cardiovascular disease (ASCVD) 2003   Non-Q MI with stent to RCA in 12/2001, normal EF, additional stents to mid CX and D1; cath in 02/2005-progressive or eye disease, 50-70% mid LAD, 90% distal LAD, 50% D1, 60-70% CX, small PDA with diffuse disease, patent RCA stent; repeat cath in 01/2008-no significant progression; non-Q MI in 02/2010 with 100% mid Cx at  prior stent, nondom. RCA with patent stent, 50% RI, nl EF, CABG-04/2010  . Cataract    bilateral cataracts removed  . Chronic kidney disease    Creatinine of 1.44 in 04/2009  . Chronic obstructive pulmonary disease ([place])   . Coronary artery disease    stent 2011  . Diabetes mellitus   . Erectile dysfunction   . Full dentures   . Gastroesophageal reflux disease   . Hepatic steatosis   . Hyperlipidemia    Predominantly elevated triglycerides  . Hypertension   . Internal hemorrhoids   . Myocardial infarction ([place]) 2002, 2011  . Neuromuscular disorder (HCC)    neuropathy  . Neuropathy    feet  . Pneumonia 2006   2006  . Tobacco abuse, in remission    35 pack years; discontinued  05/2010    Past Surgical History:  Procedure Laterality Date  . APPENDECTOMY  1980  . CATARACT EXTRACTION W/PHACO Left 09/21/2014   Procedure: CATARACT EXTRACTION PHACO AND INTRAOCULAR LENS PLACEMENT (IOC);  Surgeon: Tonny Branch, MD;  Location: [facility];  Service: Ophthalmology;  Laterality: Left;  CDE:9.77  . COLONOSCOPY    . COLONOSCOPY W/ POLYPECTOMY  2012   Dr. Olevia Perches; 2 polyps removed one of which was precancerous  . CORONARY ARTERY BYPASS GRAFT  05/2010   4 vessels August 2011  . EYE SURGERY    . HEMORRHOID SURGERY N/A 02/24/2014   Procedure: HEMORRHOIDECTOMY;  Surgeon: Joyice Faster. Cornett, MD;  Location: [place];  Service: General;  Laterality: N/A;  . POLYPECTOMY    . SHOULDER ARTHROSCOPY WITH OPEN ROTATOR CUFF REPAIR AND DISTAL CLAVICLE ACROMINECTOMY Left 11/23/2015   Procedure: SHOULDER ARTHROSCOPY WITH MINI-OPEN ROTATOR CUFF REPAIR, DISTAL CLAVICLE RESECTION AND SUBACROMIAL DECOMPRESSION.;  Surgeon: Garald Balding, MD;  Location: [place];  Service: Orthopedics;  Laterality: Left;  LEFT SHOULDER ARTHROSCOPIC SUBACROMIAL DECOMPRESSION, DISTAL CLAVICLE RESECTION, MINI-OPEN ROTATOR CUFF REPAIR.  Marland Kitchen UPPER GASTROINTESTINAL ENDOSCOPY       Current Outpatient Medications  Medication Sig Dispense Refill  . Alpha-Lipoic Acid 600 MG CAPS Take by  mouth daily.    Marland Kitchen ALPRAZolam (XANAX) 0.5 MG tablet Take 0.5 mg by mouth 2 (two) times daily as needed.     Marland Kitchen amLODipine (NORVASC) 10 MG tablet Take 1 tablet (10 mg total) daily by mouth. 90 tablet 3  . aspirin EC 81 MG tablet Take 81 mg by mouth daily.    Marland Kitchen atorvastatin (LIPITOR) 40 MG tablet TAKE 1 TABLET BY MOUTH DAILY AT 6 PM. (Patient taking differently: Take 40 mg by mouth daily at 6 PM. TAKE 1 TABLET BY MOUTH DAILY AT 6 PM.) 90 tablet 3  . carvedilol (COREG) 3.125 MG tablet Take 3.125 mg by mouth 2 (two) times daily with a meal.     . diphenhydrAMINE (BENADRYL) 25 MG tablet Take 25 mg by mouth daily.    . famotidine (PEPCID) 20  MG tablet Take 20 mg by mouth daily.    . ferrous sulfate 325 (65 FE) MG EC tablet Take 325 mg by mouth daily with breakfast.    . HUMALOG KWIKPEN 100 UNIT/ML KiwkPen INJECT 10 UNITS UNDER THE SKIN AT THE BEGINNING OF EVENING MEAL ONCE DAILY  4  . hydrOXYzine (VISTARIL) 25 MG capsule Take 25 mg by mouth every 8 (eight) hours as needed.     Marland Kitchen ibuprofen (ADVIL) 600 MG tablet Take 600 mg by mouth every 6 (six) hours as needed.     . metFORMIN (GLUCOPHAGE) 1000 MG tablet Take 1,000 mg by mouth 2 (two) times daily with a meal.      . Multiple Vitamins-Minerals (MULTIVITAMIN WITH MINERALS) tablet Take 1 tablet by mouth daily.      . TRESIBA FLEXTOUCH 200 UNIT/ML SOPN Inject 100 Units into the skin daily.  3  . TURMERIC PO Take 1 tablet by mouth daily.    . CONTOUR NEXT TEST test strip     . EPINEPHrine 0.3 mg/0.3 mL IJ SOAJ injection Inject 0.3 mLs (0.3 mg total) into the muscle as needed. Reported on 04/14/2016 (Patient not taking: Reported on 06/11/2019) 1 Device 11  . predniSONE (DELTASONE) 10 MG tablet      Current Facility-Administered Medications  Medication Dose Route Frequency Provider Last Rate Last Admin  . EPINEPHrine (EPI-PEN) injection 0.3 mg  0.3 mg Intramuscular Once Particia Nearing S, PA-C        Allergies:   Ace inhibitors, Lisinopril, Beef-derived products, Other, Pegademase bovine, Ranexa [ranolazine], and Wellbutrin [bupropion hcl]    ROS:  Please see the history of present illness.   Otherwise, review of systems are positive for none.   All other systems are reviewed and negative.    PHYSICAL EXAM: VS:  BP (!) 160/80   Pulse 70   Ht 5\' 5"  (1.651 m)   Wt 185 lb (83.9 kg)   BMI 30.79 kg/m  , BMI Body mass index is 30.79 kg/m. GENERAL:  Well appearing NECK:  No jugular venous distention, waveform within normal limits, carotid upstroke brisk and symmetric, soft brief right carotid bruits, no thyromegaly LUNGS:  Clear to auscultation bilaterally CHEST: Well-healed sternotomy  scar HEART:  PMI not displaced or sustained,S1 and S2 within normal limits, no S3, no S4, no clicks, no rubs, very brief apical systolic murmur nonradiating, no diastolic murmurs ABD:  Flat, positive bowel sounds normal in frequency in pitch, no bruits, no rebound, no guarding, no midline pulsatile mass, no hepatomegaly, no splenomegaly EXT:  2 plus pulses throughout, no edema, no cyanosis no clubbing   EKG:  EKG is  ordered today. The ekg  ordered today demonstrates sinus rhythm, rate 70, axis within normal limits, intervals within normal limits, no acute ST-T wave changes.   Recent Labs: 06/03/2019: Hemoglobin 10.9; Platelets 260.0    Lipid Panel    Component Value Date/Time   CHOL 140 02/04/2010 0000   TRIG 313 (H) 02/04/2010 0000   HDL 30 (L) 02/04/2010 0000   CHOLHDL 4.7 Ratio 02/04/2010 0000   VLDL 63 (H) 02/04/2010 0000   LDLCALC 47 02/04/2010 0000      Wt Readings from Last 3 Encounters:  01/28/20 185 lb (83.9 kg)  10/09/19 192 lb (87.1 kg)  06/11/19 193 lb (87.5 kg)      Other studies Reviewed: Additional studies/ records that were reviewed today include: Labs. Review of the above records demonstrates:  Please see elsewhere in the note.     ASSESSMENT AND PLAN:   CAD The patient has no new sypmtoms.  No further cardiovascular testing is indicated.  We will continue with aggressive risk reduction and meds as listed.  HTN The blood pressure is elevated today.  However, he says it is always low.  Otherwise at 136/70 or thereabouts at home.  He checks it routinely.  Therefore, no change in therapy.   Hyperlipidemia   LDL was 45.  No change in therapy.  DM His A1c most recently was 6.8.  Previously it had been 7.2.  No change in therapy.   BRUIT I will check carotid Dopplers.  MURMUR: He had a very minimal gradient across the aortic valve in 2017.  I reviewed that echo.  No change in therapy.  No further imaging.  Covid education:   He had his  vaccine.  Current medicines are reviewed at length with the patient today.  The patient does not have concerns regarding medicines.  The following changes have been made:  no change  Labs/ tests ordered today include: None  Orders Placed This Encounter  Procedures  . EKG 12-Lead  . VAS US CAROTID     Disposition:   FU with in on year    Signed, Minus Breeding, MD  01/28/2020 3:38 PM    [place]

## 2020-01-28 ENCOUNTER — Encounter: Payer: Self-pay | Admitting: Cardiology

## 2020-01-28 ENCOUNTER — Ambulatory Visit: Payer: Medicare HMO | Admitting: Cardiology

## 2020-01-28 ENCOUNTER — Other Ambulatory Visit: Payer: Self-pay

## 2020-01-28 VITALS — BP 160/80 | HR 70 | Ht 65.0 in | Wt 185.0 lb

## 2020-01-28 DIAGNOSIS — I10 Essential (primary) hypertension: Secondary | ICD-10-CM | POA: Diagnosis not present

## 2020-01-28 DIAGNOSIS — Z794 Long term (current) use of insulin: Secondary | ICD-10-CM | POA: Diagnosis not present

## 2020-01-28 DIAGNOSIS — E785 Hyperlipidemia, unspecified: Secondary | ICD-10-CM | POA: Diagnosis not present

## 2020-01-28 DIAGNOSIS — R0989 Other specified symptoms and signs involving the circulatory and respiratory systems: Secondary | ICD-10-CM

## 2020-01-28 DIAGNOSIS — I2510 Atherosclerotic heart disease of native coronary artery without angina pectoris: Secondary | ICD-10-CM | POA: Diagnosis not present

## 2020-01-28 DIAGNOSIS — Z7189 Other specified counseling: Secondary | ICD-10-CM | POA: Diagnosis not present

## 2020-01-28 DIAGNOSIS — R002 Palpitations: Secondary | ICD-10-CM | POA: Diagnosis not present

## 2020-01-28 DIAGNOSIS — E1122 Type 2 diabetes mellitus with diabetic chronic kidney disease: Secondary | ICD-10-CM | POA: Diagnosis not present

## 2020-01-28 DIAGNOSIS — E1169 Type 2 diabetes mellitus with other specified complication: Secondary | ICD-10-CM | POA: Diagnosis not present

## 2020-01-28 DIAGNOSIS — J449 Chronic obstructive pulmonary disease, unspecified: Secondary | ICD-10-CM | POA: Diagnosis not present

## 2020-01-28 NOTE — Patient Instructions (Signed)
Medication Instructions:  The current medical regimen is effective;  continue present plan and medications.  *If you need a refill on your cardiac medications before your next appointment, please call your pharmacy*  Testing/Procedures: Your physician has requested that you have a carotid duplex. This test is an ultrasound of the carotid arteries in your neck. It looks at blood flow through these arteries that supply the brain with blood. Allow one hour for this exam. There are no restrictions or special instructions.  Follow-Up: At [facility], you and your health needs are our priority.  As part of our continuing mission to provide you with exceptional heart care, we have created designated Provider Care Teams.  These Care Teams include your primary Cardiologist (physician) and Advanced Practice Providers (APPs -  Physician Assistants and Nurse Practitioners) who all work together to provide you with the care you need, when you need it.  We recommend signing up for the patient portal called "MyChart".  Sign up information is provided on this After Visit Summary.  MyChart is used to connect with patients for Virtual Visits (Telemedicine).  Patients are able to view lab/test results, encounter notes, upcoming appointments, etc.  Non-urgent messages can be sent to your provider as well.   To learn more about what you can do with MyChart, go to [email or web address].    Your next appointment:   12 month(s)  The format for your next appointment:   In Person  Provider:   James Hochrein, MD   Thank you for choosing [facility]!!     

## 2020-02-04 ENCOUNTER — Encounter (HOSPITAL_COMMUNITY): Payer: Medicare HMO

## 2020-02-09 ENCOUNTER — Ambulatory Visit (HOSPITAL_COMMUNITY)
Admission: RE | Admit: 2020-02-09 | Discharge: 2020-02-09 | Disposition: A | Discharge status: Home / Self Care | Payer: Medicare HMO | Source: Ambulatory Visit | Attending: Cardiology | Admitting: Cardiology

## 2020-02-09 ENCOUNTER — Other Ambulatory Visit: Payer: Self-pay

## 2020-02-09 DIAGNOSIS — R0989 Other specified symptoms and signs involving the circulatory and respiratory systems: Secondary | ICD-10-CM | POA: Diagnosis not present

## 2020-02-26 DIAGNOSIS — Z961 Presence of intraocular lens: Secondary | ICD-10-CM | POA: Diagnosis not present

## 2020-02-26 DIAGNOSIS — H5200 Hypermetropia, unspecified eye: Secondary | ICD-10-CM | POA: Diagnosis not present

## 2020-02-26 DIAGNOSIS — E7800 Pure hypercholesterolemia, unspecified: Secondary | ICD-10-CM | POA: Diagnosis not present

## 2020-02-26 DIAGNOSIS — E119 Type 2 diabetes mellitus without complications: Secondary | ICD-10-CM | POA: Diagnosis not present

## 2020-05-26 DIAGNOSIS — F419 Anxiety disorder, unspecified: Secondary | ICD-10-CM | POA: Diagnosis not present

## 2020-05-26 DIAGNOSIS — E782 Mixed hyperlipidemia: Secondary | ICD-10-CM | POA: Diagnosis not present

## 2020-05-26 DIAGNOSIS — F17210 Nicotine dependence, cigarettes, uncomplicated: Secondary | ICD-10-CM | POA: Diagnosis not present

## 2020-05-26 DIAGNOSIS — K7581 Nonalcoholic steatohepatitis (NASH): Secondary | ICD-10-CM | POA: Diagnosis not present

## 2020-05-26 DIAGNOSIS — Z0001 Encounter for general adult medical examination with abnormal findings: Secondary | ICD-10-CM | POA: Diagnosis not present

## 2020-05-26 DIAGNOSIS — I10 Essential (primary) hypertension: Secondary | ICD-10-CM | POA: Diagnosis not present

## 2020-05-26 DIAGNOSIS — E1142 Type 2 diabetes mellitus with diabetic polyneuropathy: Secondary | ICD-10-CM | POA: Diagnosis not present

## 2020-05-26 DIAGNOSIS — I2510 Atherosclerotic heart disease of native coronary artery without angina pectoris: Secondary | ICD-10-CM | POA: Diagnosis not present

## 2020-05-26 DIAGNOSIS — J301 Allergic rhinitis due to pollen: Secondary | ICD-10-CM | POA: Diagnosis not present

## 2020-05-31 DIAGNOSIS — D3131 Benign neoplasm of right choroid: Secondary | ICD-10-CM | POA: Diagnosis not present

## 2020-05-31 DIAGNOSIS — H43393 Other vitreous opacities, bilateral: Secondary | ICD-10-CM | POA: Diagnosis not present

## 2020-05-31 DIAGNOSIS — E119 Type 2 diabetes mellitus without complications: Secondary | ICD-10-CM | POA: Diagnosis not present

## 2020-06-01 DIAGNOSIS — R809 Proteinuria, unspecified: Secondary | ICD-10-CM | POA: Diagnosis not present

## 2020-06-01 DIAGNOSIS — D649 Anemia, unspecified: Secondary | ICD-10-CM | POA: Diagnosis not present

## 2020-06-01 DIAGNOSIS — E782 Mixed hyperlipidemia: Secondary | ICD-10-CM | POA: Diagnosis not present

## 2020-06-01 DIAGNOSIS — I2510 Atherosclerotic heart disease of native coronary artery without angina pectoris: Secondary | ICD-10-CM | POA: Diagnosis not present

## 2020-06-01 DIAGNOSIS — K7581 Nonalcoholic steatohepatitis (NASH): Secondary | ICD-10-CM | POA: Diagnosis not present

## 2020-06-01 DIAGNOSIS — F419 Anxiety disorder, unspecified: Secondary | ICD-10-CM | POA: Diagnosis not present

## 2020-06-01 DIAGNOSIS — I10 Essential (primary) hypertension: Secondary | ICD-10-CM | POA: Diagnosis not present

## 2020-06-01 DIAGNOSIS — R944 Abnormal results of kidney function studies: Secondary | ICD-10-CM | POA: Diagnosis not present

## 2020-06-01 DIAGNOSIS — E1140 Type 2 diabetes mellitus with diabetic neuropathy, unspecified: Secondary | ICD-10-CM | POA: Diagnosis not present

## 2020-06-03 DIAGNOSIS — E113393 Type 2 diabetes mellitus with moderate nonproliferative diabetic retinopathy without macular edema, bilateral: Secondary | ICD-10-CM | POA: Diagnosis not present

## 2020-06-03 DIAGNOSIS — D3131 Benign neoplasm of right choroid: Secondary | ICD-10-CM | POA: Diagnosis not present

## 2020-06-03 DIAGNOSIS — H43813 Vitreous degeneration, bilateral: Secondary | ICD-10-CM | POA: Diagnosis not present

## 2020-06-03 DIAGNOSIS — H43393 Other vitreous opacities, bilateral: Secondary | ICD-10-CM | POA: Diagnosis not present

## 2020-06-24 DIAGNOSIS — F419 Anxiety disorder, unspecified: Secondary | ICD-10-CM | POA: Diagnosis not present

## 2020-06-24 DIAGNOSIS — K7581 Nonalcoholic steatohepatitis (NASH): Secondary | ICD-10-CM | POA: Diagnosis not present

## 2020-06-24 DIAGNOSIS — R809 Proteinuria, unspecified: Secondary | ICD-10-CM | POA: Diagnosis not present

## 2020-06-24 DIAGNOSIS — D649 Anemia, unspecified: Secondary | ICD-10-CM | POA: Diagnosis not present

## 2020-06-24 DIAGNOSIS — I2510 Atherosclerotic heart disease of native coronary artery without angina pectoris: Secondary | ICD-10-CM | POA: Diagnosis not present

## 2020-06-24 DIAGNOSIS — E1140 Type 2 diabetes mellitus with diabetic neuropathy, unspecified: Secondary | ICD-10-CM | POA: Diagnosis not present

## 2020-06-24 DIAGNOSIS — R944 Abnormal results of kidney function studies: Secondary | ICD-10-CM | POA: Diagnosis not present

## 2020-06-24 DIAGNOSIS — E782 Mixed hyperlipidemia: Secondary | ICD-10-CM | POA: Diagnosis not present

## 2020-06-24 DIAGNOSIS — I10 Essential (primary) hypertension: Secondary | ICD-10-CM | POA: Diagnosis not present

## 2020-07-05 ENCOUNTER — Ambulatory Visit: Payer: Medicare HMO | Admitting: Podiatry

## 2020-07-05 ENCOUNTER — Other Ambulatory Visit: Payer: Self-pay

## 2020-07-05 DIAGNOSIS — G5762 Lesion of plantar nerve, left lower limb: Secondary | ICD-10-CM

## 2020-07-05 NOTE — Progress Notes (Signed)
   HPI: 64 y.o. male presenting today for evaluation of left forefoot pain.  Patient states that over the last month he has experienced left forefoot pain and tenderness.  He states that it feels like something is in the way on his left forefoot.  He recently states that the pain has since resolved.  He was debating whether he should cancel his appointment or not today.  Past Medical History:  Diagnosis Date  . Abnormal LFTs   . Adenomatous colon polyp 2012  . Allergy    to Red Meat  . Anemia   . Anxiety   . Arteriosclerotic cardiovascular disease (ASCVD) 2003   Non-Q MI with stent to RCA in 12/2001, normal EF, additional stents to mid CX and D1; cath in 02/2005-progressive or eye disease, 50-70% mid LAD, 90% distal LAD, 50% D1, 60-70% CX, small PDA with diffuse disease, patent RCA stent; repeat cath in 01/2008-no significant progression; non-Q MI in 02/2010 with 100% mid Cx at  prior stent, nondom. RCA with patent stent, 50% RI, nl EF, CABG-04/2010  . Cataract    bilateral cataracts removed  . Chronic kidney disease    Creatinine of 1.44 in 04/2009  . Chronic obstructive pulmonary disease ([place])   . Coronary artery disease    stent 2011  . Diabetes mellitus   . Erectile dysfunction   . Full dentures   . Gastroesophageal reflux disease   . Hepatic steatosis   . Hyperlipidemia    Predominantly elevated triglycerides  . Hypertension   . Internal hemorrhoids   . Myocardial infarction ([place]) 2002, 2011  . Neuromuscular disorder (HCC)    neuropathy  . Neuropathy    feet  . Pneumonia 2006   2006  . Tobacco abuse, in remission    35 pack years; discontinued 05/2010     Physical Exam: General: The patient is alert and oriented x3 in no acute distress.  Dermatology: Skin is warm, dry and supple bilateral lower extremities. Negative for open lesions or macerations.  Vascular: Palpable pedal pulses bilaterally. No edema or erythema noted. Capillary refill within normal  limits.  Neurological: Epicritic and protective threshold grossly intact bilaterally.   Musculoskeletal Exam: Range of motion within normal limits to all pedal and ankle joints bilateral. Muscle strength 5/5 in all groups bilateral.   Radiographic Exam:  Declined x-rays today  Assessment: 1.  Morton's neuroma left-resolved   Plan of Care:  1. Patient evaluated. 2.  After discussing with the patient what incident may have caused the patient's symptoms he does recall that he has been working on restoring a pickup truck recently.  He has been doing a lot of squatting which has been putting pressure on his forefoot.  The pain has since resolved since he is not working on his truck anymore. 3.  Recommend good supportive shoes 4.  Return to clinic as needed      Edrick Kins, DPM [facility]  Dr. Edrick Kins, DPM    [address], [place] [address]                Office [phone]  Fax [phone]

## 2020-07-13 DIAGNOSIS — D649 Anemia, unspecified: Secondary | ICD-10-CM | POA: Diagnosis not present

## 2020-07-13 DIAGNOSIS — E1140 Type 2 diabetes mellitus with diabetic neuropathy, unspecified: Secondary | ICD-10-CM | POA: Diagnosis not present

## 2020-07-13 DIAGNOSIS — G9009 Other idiopathic peripheral autonomic neuropathy: Secondary | ICD-10-CM | POA: Diagnosis not present

## 2020-07-13 DIAGNOSIS — R944 Abnormal results of kidney function studies: Secondary | ICD-10-CM | POA: Diagnosis not present

## 2020-07-13 DIAGNOSIS — F17210 Nicotine dependence, cigarettes, uncomplicated: Secondary | ICD-10-CM | POA: Diagnosis not present

## 2020-07-13 DIAGNOSIS — Z0001 Encounter for general adult medical examination with abnormal findings: Secondary | ICD-10-CM | POA: Diagnosis not present

## 2020-07-13 DIAGNOSIS — R809 Proteinuria, unspecified: Secondary | ICD-10-CM | POA: Diagnosis not present

## 2020-07-13 DIAGNOSIS — F419 Anxiety disorder, unspecified: Secondary | ICD-10-CM | POA: Diagnosis not present

## 2020-07-13 DIAGNOSIS — E1142 Type 2 diabetes mellitus with diabetic polyneuropathy: Secondary | ICD-10-CM | POA: Diagnosis not present

## 2020-07-13 DIAGNOSIS — J301 Allergic rhinitis due to pollen: Secondary | ICD-10-CM | POA: Diagnosis not present

## 2020-07-13 DIAGNOSIS — K7581 Nonalcoholic steatohepatitis (NASH): Secondary | ICD-10-CM | POA: Diagnosis not present

## 2020-07-13 DIAGNOSIS — E782 Mixed hyperlipidemia: Secondary | ICD-10-CM | POA: Diagnosis not present

## 2020-07-13 DIAGNOSIS — I10 Essential (primary) hypertension: Secondary | ICD-10-CM | POA: Diagnosis not present

## 2020-07-13 DIAGNOSIS — I2510 Atherosclerotic heart disease of native coronary artery without angina pectoris: Secondary | ICD-10-CM | POA: Diagnosis not present

## 2020-07-13 DIAGNOSIS — K222 Esophageal obstruction: Secondary | ICD-10-CM | POA: Diagnosis not present

## 2020-07-20 DIAGNOSIS — Z85828 Personal history of other malignant neoplasm of skin: Secondary | ICD-10-CM | POA: Diagnosis not present

## 2020-07-20 DIAGNOSIS — L570 Actinic keratosis: Secondary | ICD-10-CM | POA: Diagnosis not present

## 2020-07-20 DIAGNOSIS — L309 Dermatitis, unspecified: Secondary | ICD-10-CM | POA: Diagnosis not present

## 2020-07-21 DIAGNOSIS — I10 Essential (primary) hypertension: Secondary | ICD-10-CM | POA: Diagnosis not present

## 2020-07-21 DIAGNOSIS — R944 Abnormal results of kidney function studies: Secondary | ICD-10-CM | POA: Diagnosis not present

## 2020-07-21 DIAGNOSIS — D649 Anemia, unspecified: Secondary | ICD-10-CM | POA: Diagnosis not present

## 2020-07-21 DIAGNOSIS — E1140 Type 2 diabetes mellitus with diabetic neuropathy, unspecified: Secondary | ICD-10-CM | POA: Diagnosis not present

## 2020-07-21 DIAGNOSIS — R809 Proteinuria, unspecified: Secondary | ICD-10-CM | POA: Diagnosis not present

## 2020-07-21 DIAGNOSIS — K222 Esophageal obstruction: Secondary | ICD-10-CM | POA: Diagnosis not present

## 2020-07-21 DIAGNOSIS — G9009 Other idiopathic peripheral autonomic neuropathy: Secondary | ICD-10-CM | POA: Diagnosis not present

## 2020-08-09 DIAGNOSIS — K222 Esophageal obstruction: Secondary | ICD-10-CM | POA: Diagnosis not present

## 2020-08-09 DIAGNOSIS — D649 Anemia, unspecified: Secondary | ICD-10-CM | POA: Diagnosis not present

## 2020-08-09 DIAGNOSIS — G9009 Other idiopathic peripheral autonomic neuropathy: Secondary | ICD-10-CM | POA: Diagnosis not present

## 2020-08-09 DIAGNOSIS — R944 Abnormal results of kidney function studies: Secondary | ICD-10-CM | POA: Diagnosis not present

## 2020-08-09 DIAGNOSIS — I10 Essential (primary) hypertension: Secondary | ICD-10-CM | POA: Diagnosis not present

## 2020-08-09 DIAGNOSIS — E1140 Type 2 diabetes mellitus with diabetic neuropathy, unspecified: Secondary | ICD-10-CM | POA: Diagnosis not present

## 2020-08-09 DIAGNOSIS — R809 Proteinuria, unspecified: Secondary | ICD-10-CM | POA: Diagnosis not present

## 2020-08-31 ENCOUNTER — Other Ambulatory Visit: Payer: Self-pay | Admitting: Nephrology

## 2020-08-31 DIAGNOSIS — E1122 Type 2 diabetes mellitus with diabetic chronic kidney disease: Secondary | ICD-10-CM

## 2020-08-31 DIAGNOSIS — Z79899 Other long term (current) drug therapy: Secondary | ICD-10-CM

## 2020-08-31 DIAGNOSIS — I5032 Chronic diastolic (congestive) heart failure: Secondary | ICD-10-CM | POA: Diagnosis not present

## 2020-08-31 DIAGNOSIS — N189 Chronic kidney disease, unspecified: Secondary | ICD-10-CM

## 2020-08-31 DIAGNOSIS — I129 Hypertensive chronic kidney disease with stage 1 through stage 4 chronic kidney disease, or unspecified chronic kidney disease: Secondary | ICD-10-CM | POA: Diagnosis not present

## 2020-08-31 DIAGNOSIS — E1129 Type 2 diabetes mellitus with other diabetic kidney complication: Secondary | ICD-10-CM | POA: Diagnosis not present

## 2020-08-31 DIAGNOSIS — D631 Anemia in chronic kidney disease: Secondary | ICD-10-CM | POA: Diagnosis not present

## 2020-08-31 DIAGNOSIS — R809 Proteinuria, unspecified: Secondary | ICD-10-CM

## 2020-09-08 ENCOUNTER — Ambulatory Visit: Payer: Medicare HMO

## 2020-09-09 ENCOUNTER — Other Ambulatory Visit: Payer: Self-pay

## 2020-09-09 ENCOUNTER — Ambulatory Visit (HOSPITAL_COMMUNITY)
Admission: RE | Admit: 2020-09-09 | Discharge: 2020-09-09 | Disposition: A | Discharge status: Home / Self Care | Payer: Medicare HMO | Source: Ambulatory Visit | Attending: Nephrology | Admitting: Nephrology

## 2020-09-09 DIAGNOSIS — I129 Hypertensive chronic kidney disease with stage 1 through stage 4 chronic kidney disease, or unspecified chronic kidney disease: Secondary | ICD-10-CM | POA: Insufficient documentation

## 2020-09-09 DIAGNOSIS — Z79899 Other long term (current) drug therapy: Secondary | ICD-10-CM

## 2020-09-09 DIAGNOSIS — E1129 Type 2 diabetes mellitus with other diabetic kidney complication: Secondary | ICD-10-CM | POA: Diagnosis not present

## 2020-09-09 DIAGNOSIS — D631 Anemia in chronic kidney disease: Secondary | ICD-10-CM | POA: Diagnosis not present

## 2020-09-09 DIAGNOSIS — I5032 Chronic diastolic (congestive) heart failure: Secondary | ICD-10-CM | POA: Diagnosis not present

## 2020-09-09 DIAGNOSIS — E1122 Type 2 diabetes mellitus with diabetic chronic kidney disease: Secondary | ICD-10-CM | POA: Diagnosis not present

## 2020-09-09 DIAGNOSIS — N189 Chronic kidney disease, unspecified: Secondary | ICD-10-CM | POA: Diagnosis not present

## 2020-09-09 DIAGNOSIS — N200 Calculus of kidney: Secondary | ICD-10-CM | POA: Diagnosis not present

## 2020-09-09 DIAGNOSIS — E559 Vitamin D deficiency, unspecified: Secondary | ICD-10-CM | POA: Diagnosis not present

## 2020-09-09 DIAGNOSIS — N281 Cyst of kidney, acquired: Secondary | ICD-10-CM | POA: Diagnosis not present

## 2020-09-09 DIAGNOSIS — R809 Proteinuria, unspecified: Secondary | ICD-10-CM | POA: Diagnosis not present

## 2020-09-27 DIAGNOSIS — E1122 Type 2 diabetes mellitus with diabetic chronic kidney disease: Secondary | ICD-10-CM | POA: Diagnosis not present

## 2020-09-27 DIAGNOSIS — N189 Chronic kidney disease, unspecified: Secondary | ICD-10-CM | POA: Diagnosis not present

## 2020-09-27 DIAGNOSIS — R808 Other proteinuria: Secondary | ICD-10-CM | POA: Diagnosis not present

## 2020-09-27 DIAGNOSIS — N200 Calculus of kidney: Secondary | ICD-10-CM | POA: Diagnosis not present

## 2020-09-27 DIAGNOSIS — R809 Proteinuria, unspecified: Secondary | ICD-10-CM | POA: Diagnosis not present

## 2020-09-27 DIAGNOSIS — N281 Cyst of kidney, acquired: Secondary | ICD-10-CM | POA: Diagnosis not present

## 2020-09-27 DIAGNOSIS — D508 Other iron deficiency anemias: Secondary | ICD-10-CM | POA: Diagnosis not present

## 2020-09-27 DIAGNOSIS — Z79899 Other long term (current) drug therapy: Secondary | ICD-10-CM | POA: Diagnosis not present

## 2020-09-27 DIAGNOSIS — I5032 Chronic diastolic (congestive) heart failure: Secondary | ICD-10-CM | POA: Diagnosis not present

## 2020-10-08 DIAGNOSIS — D649 Anemia, unspecified: Secondary | ICD-10-CM | POA: Diagnosis not present

## 2020-10-08 DIAGNOSIS — K7581 Nonalcoholic steatohepatitis (NASH): Secondary | ICD-10-CM | POA: Diagnosis not present

## 2020-10-08 DIAGNOSIS — R944 Abnormal results of kidney function studies: Secondary | ICD-10-CM | POA: Diagnosis not present

## 2020-10-08 DIAGNOSIS — R809 Proteinuria, unspecified: Secondary | ICD-10-CM | POA: Diagnosis not present

## 2020-10-08 DIAGNOSIS — I10 Essential (primary) hypertension: Secondary | ICD-10-CM | POA: Diagnosis not present

## 2020-10-08 DIAGNOSIS — I2510 Atherosclerotic heart disease of native coronary artery without angina pectoris: Secondary | ICD-10-CM | POA: Diagnosis not present

## 2020-10-08 DIAGNOSIS — F419 Anxiety disorder, unspecified: Secondary | ICD-10-CM | POA: Diagnosis not present

## 2020-10-08 DIAGNOSIS — E782 Mixed hyperlipidemia: Secondary | ICD-10-CM | POA: Diagnosis not present

## 2020-10-08 DIAGNOSIS — E1140 Type 2 diabetes mellitus with diabetic neuropathy, unspecified: Secondary | ICD-10-CM | POA: Diagnosis not present

## 2020-11-02 DIAGNOSIS — K7581 Nonalcoholic steatohepatitis (NASH): Secondary | ICD-10-CM | POA: Diagnosis not present

## 2020-11-02 DIAGNOSIS — I2510 Atherosclerotic heart disease of native coronary artery without angina pectoris: Secondary | ICD-10-CM | POA: Diagnosis not present

## 2020-11-02 DIAGNOSIS — N200 Calculus of kidney: Secondary | ICD-10-CM | POA: Diagnosis not present

## 2020-11-02 DIAGNOSIS — E1142 Type 2 diabetes mellitus with diabetic polyneuropathy: Secondary | ICD-10-CM | POA: Diagnosis not present

## 2020-11-02 DIAGNOSIS — I5032 Chronic diastolic (congestive) heart failure: Secondary | ICD-10-CM | POA: Diagnosis not present

## 2020-11-02 DIAGNOSIS — E1122 Type 2 diabetes mellitus with diabetic chronic kidney disease: Secondary | ICD-10-CM | POA: Diagnosis not present

## 2020-11-02 DIAGNOSIS — N281 Cyst of kidney, acquired: Secondary | ICD-10-CM | POA: Diagnosis not present

## 2020-11-02 DIAGNOSIS — R808 Other proteinuria: Secondary | ICD-10-CM | POA: Diagnosis not present

## 2020-11-02 DIAGNOSIS — F419 Anxiety disorder, unspecified: Secondary | ICD-10-CM | POA: Diagnosis not present

## 2020-11-02 DIAGNOSIS — E1129 Type 2 diabetes mellitus with other diabetic kidney complication: Secondary | ICD-10-CM | POA: Diagnosis not present

## 2020-11-02 DIAGNOSIS — I10 Essential (primary) hypertension: Secondary | ICD-10-CM | POA: Diagnosis not present

## 2020-11-02 DIAGNOSIS — J301 Allergic rhinitis due to pollen: Secondary | ICD-10-CM | POA: Diagnosis not present

## 2020-11-02 DIAGNOSIS — F17210 Nicotine dependence, cigarettes, uncomplicated: Secondary | ICD-10-CM | POA: Diagnosis not present

## 2020-11-02 DIAGNOSIS — N189 Chronic kidney disease, unspecified: Secondary | ICD-10-CM | POA: Diagnosis not present

## 2020-11-02 DIAGNOSIS — D508 Other iron deficiency anemias: Secondary | ICD-10-CM | POA: Diagnosis not present

## 2020-11-02 DIAGNOSIS — Z0001 Encounter for general adult medical examination with abnormal findings: Secondary | ICD-10-CM | POA: Diagnosis not present

## 2020-11-02 DIAGNOSIS — E782 Mixed hyperlipidemia: Secondary | ICD-10-CM | POA: Diagnosis not present

## 2020-11-02 DIAGNOSIS — R809 Proteinuria, unspecified: Secondary | ICD-10-CM | POA: Diagnosis not present

## 2020-11-06 DIAGNOSIS — R944 Abnormal results of kidney function studies: Secondary | ICD-10-CM | POA: Diagnosis not present

## 2020-11-06 DIAGNOSIS — E1140 Type 2 diabetes mellitus with diabetic neuropathy, unspecified: Secondary | ICD-10-CM | POA: Diagnosis not present

## 2020-11-06 DIAGNOSIS — D649 Anemia, unspecified: Secondary | ICD-10-CM | POA: Diagnosis not present

## 2020-11-06 DIAGNOSIS — R809 Proteinuria, unspecified: Secondary | ICD-10-CM | POA: Diagnosis not present

## 2020-11-06 DIAGNOSIS — K7581 Nonalcoholic steatohepatitis (NASH): Secondary | ICD-10-CM | POA: Diagnosis not present

## 2020-11-06 DIAGNOSIS — E782 Mixed hyperlipidemia: Secondary | ICD-10-CM | POA: Diagnosis not present

## 2020-11-06 DIAGNOSIS — F419 Anxiety disorder, unspecified: Secondary | ICD-10-CM | POA: Diagnosis not present

## 2020-11-06 DIAGNOSIS — I2510 Atherosclerotic heart disease of native coronary artery without angina pectoris: Secondary | ICD-10-CM | POA: Diagnosis not present

## 2020-11-06 DIAGNOSIS — I10 Essential (primary) hypertension: Secondary | ICD-10-CM | POA: Diagnosis not present

## 2020-11-08 DIAGNOSIS — I2510 Atherosclerotic heart disease of native coronary artery without angina pectoris: Secondary | ICD-10-CM | POA: Diagnosis not present

## 2020-11-08 DIAGNOSIS — R944 Abnormal results of kidney function studies: Secondary | ICD-10-CM | POA: Diagnosis not present

## 2020-11-08 DIAGNOSIS — R809 Proteinuria, unspecified: Secondary | ICD-10-CM | POA: Diagnosis not present

## 2020-11-08 DIAGNOSIS — F419 Anxiety disorder, unspecified: Secondary | ICD-10-CM | POA: Diagnosis not present

## 2020-11-08 DIAGNOSIS — D649 Anemia, unspecified: Secondary | ICD-10-CM | POA: Diagnosis not present

## 2020-11-08 DIAGNOSIS — E782 Mixed hyperlipidemia: Secondary | ICD-10-CM | POA: Diagnosis not present

## 2020-11-08 DIAGNOSIS — I10 Essential (primary) hypertension: Secondary | ICD-10-CM | POA: Diagnosis not present

## 2020-11-08 DIAGNOSIS — E1140 Type 2 diabetes mellitus with diabetic neuropathy, unspecified: Secondary | ICD-10-CM | POA: Diagnosis not present

## 2020-11-08 DIAGNOSIS — K7581 Nonalcoholic steatohepatitis (NASH): Secondary | ICD-10-CM | POA: Diagnosis not present

## 2020-12-02 DIAGNOSIS — D631 Anemia in chronic kidney disease: Secondary | ICD-10-CM | POA: Diagnosis not present

## 2020-12-02 DIAGNOSIS — E1122 Type 2 diabetes mellitus with diabetic chronic kidney disease: Secondary | ICD-10-CM | POA: Diagnosis not present

## 2020-12-02 DIAGNOSIS — E1129 Type 2 diabetes mellitus with other diabetic kidney complication: Secondary | ICD-10-CM | POA: Diagnosis not present

## 2020-12-02 DIAGNOSIS — I129 Hypertensive chronic kidney disease with stage 1 through stage 4 chronic kidney disease, or unspecified chronic kidney disease: Secondary | ICD-10-CM | POA: Diagnosis not present

## 2020-12-02 DIAGNOSIS — R809 Proteinuria, unspecified: Secondary | ICD-10-CM | POA: Diagnosis not present

## 2020-12-02 DIAGNOSIS — N189 Chronic kidney disease, unspecified: Secondary | ICD-10-CM | POA: Diagnosis not present

## 2020-12-02 DIAGNOSIS — D508 Other iron deficiency anemias: Secondary | ICD-10-CM | POA: Diagnosis not present

## 2020-12-02 DIAGNOSIS — I5032 Chronic diastolic (congestive) heart failure: Secondary | ICD-10-CM | POA: Diagnosis not present

## 2020-12-02 DIAGNOSIS — R808 Other proteinuria: Secondary | ICD-10-CM | POA: Diagnosis not present

## 2020-12-06 DIAGNOSIS — E782 Mixed hyperlipidemia: Secondary | ICD-10-CM | POA: Diagnosis not present

## 2020-12-06 DIAGNOSIS — E1140 Type 2 diabetes mellitus with diabetic neuropathy, unspecified: Secondary | ICD-10-CM | POA: Diagnosis not present

## 2020-12-06 DIAGNOSIS — K222 Esophageal obstruction: Secondary | ICD-10-CM | POA: Diagnosis not present

## 2020-12-06 DIAGNOSIS — I10 Essential (primary) hypertension: Secondary | ICD-10-CM | POA: Diagnosis not present

## 2020-12-06 DIAGNOSIS — D649 Anemia, unspecified: Secondary | ICD-10-CM | POA: Diagnosis not present

## 2020-12-06 DIAGNOSIS — I2510 Atherosclerotic heart disease of native coronary artery without angina pectoris: Secondary | ICD-10-CM | POA: Diagnosis not present

## 2020-12-06 DIAGNOSIS — R809 Proteinuria, unspecified: Secondary | ICD-10-CM | POA: Diagnosis not present

## 2020-12-06 DIAGNOSIS — K7581 Nonalcoholic steatohepatitis (NASH): Secondary | ICD-10-CM | POA: Diagnosis not present

## 2020-12-06 DIAGNOSIS — F419 Anxiety disorder, unspecified: Secondary | ICD-10-CM | POA: Diagnosis not present

## 2020-12-20 DIAGNOSIS — E113393 Type 2 diabetes mellitus with moderate nonproliferative diabetic retinopathy without macular edema, bilateral: Secondary | ICD-10-CM | POA: Diagnosis not present

## 2020-12-20 DIAGNOSIS — H43813 Vitreous degeneration, bilateral: Secondary | ICD-10-CM | POA: Diagnosis not present

## 2020-12-20 DIAGNOSIS — D3131 Benign neoplasm of right choroid: Secondary | ICD-10-CM | POA: Diagnosis not present

## 2020-12-20 DIAGNOSIS — H43393 Other vitreous opacities, bilateral: Secondary | ICD-10-CM | POA: Diagnosis not present

## 2021-01-05 DIAGNOSIS — D649 Anemia, unspecified: Secondary | ICD-10-CM | POA: Diagnosis not present

## 2021-01-05 DIAGNOSIS — R809 Proteinuria, unspecified: Secondary | ICD-10-CM | POA: Diagnosis not present

## 2021-01-05 DIAGNOSIS — I10 Essential (primary) hypertension: Secondary | ICD-10-CM | POA: Diagnosis not present

## 2021-01-05 DIAGNOSIS — F419 Anxiety disorder, unspecified: Secondary | ICD-10-CM | POA: Diagnosis not present

## 2021-01-05 DIAGNOSIS — K7581 Nonalcoholic steatohepatitis (NASH): Secondary | ICD-10-CM | POA: Diagnosis not present

## 2021-01-05 DIAGNOSIS — E1140 Type 2 diabetes mellitus with diabetic neuropathy, unspecified: Secondary | ICD-10-CM | POA: Diagnosis not present

## 2021-01-05 DIAGNOSIS — E782 Mixed hyperlipidemia: Secondary | ICD-10-CM | POA: Diagnosis not present

## 2021-01-05 DIAGNOSIS — K222 Esophageal obstruction: Secondary | ICD-10-CM | POA: Diagnosis not present

## 2021-01-05 DIAGNOSIS — I2510 Atherosclerotic heart disease of native coronary artery without angina pectoris: Secondary | ICD-10-CM | POA: Diagnosis not present

## 2021-01-27 DIAGNOSIS — E782 Mixed hyperlipidemia: Secondary | ICD-10-CM | POA: Diagnosis not present

## 2021-01-27 DIAGNOSIS — I10 Essential (primary) hypertension: Secondary | ICD-10-CM | POA: Diagnosis not present

## 2021-01-27 DIAGNOSIS — E1142 Type 2 diabetes mellitus with diabetic polyneuropathy: Secondary | ICD-10-CM | POA: Diagnosis not present

## 2021-01-27 DIAGNOSIS — R809 Proteinuria, unspecified: Secondary | ICD-10-CM | POA: Diagnosis not present

## 2021-01-27 DIAGNOSIS — E1122 Type 2 diabetes mellitus with diabetic chronic kidney disease: Secondary | ICD-10-CM | POA: Diagnosis not present

## 2021-01-27 DIAGNOSIS — D508 Other iron deficiency anemias: Secondary | ICD-10-CM | POA: Diagnosis not present

## 2021-01-27 DIAGNOSIS — D649 Anemia, unspecified: Secondary | ICD-10-CM | POA: Diagnosis not present

## 2021-01-27 DIAGNOSIS — I129 Hypertensive chronic kidney disease with stage 1 through stage 4 chronic kidney disease, or unspecified chronic kidney disease: Secondary | ICD-10-CM | POA: Diagnosis not present

## 2021-01-27 DIAGNOSIS — E1129 Type 2 diabetes mellitus with other diabetic kidney complication: Secondary | ICD-10-CM | POA: Diagnosis not present

## 2021-01-27 DIAGNOSIS — D631 Anemia in chronic kidney disease: Secondary | ICD-10-CM | POA: Diagnosis not present

## 2021-01-27 DIAGNOSIS — D509 Iron deficiency anemia, unspecified: Secondary | ICD-10-CM | POA: Diagnosis not present

## 2021-01-27 DIAGNOSIS — N189 Chronic kidney disease, unspecified: Secondary | ICD-10-CM | POA: Diagnosis not present

## 2021-01-27 DIAGNOSIS — E1140 Type 2 diabetes mellitus with diabetic neuropathy, unspecified: Secondary | ICD-10-CM | POA: Diagnosis not present

## 2021-01-27 DIAGNOSIS — E1165 Type 2 diabetes mellitus with hyperglycemia: Secondary | ICD-10-CM | POA: Diagnosis not present

## 2021-01-27 DIAGNOSIS — N1831 Chronic kidney disease, stage 3a: Secondary | ICD-10-CM | POA: Diagnosis not present

## 2021-01-27 DIAGNOSIS — I5032 Chronic diastolic (congestive) heart failure: Secondary | ICD-10-CM | POA: Diagnosis not present

## 2021-02-02 ENCOUNTER — Ambulatory Visit: Payer: Medicare HMO | Admitting: Cardiology

## 2021-02-04 DIAGNOSIS — R808 Other proteinuria: Secondary | ICD-10-CM | POA: Diagnosis not present

## 2021-02-04 DIAGNOSIS — E1122 Type 2 diabetes mellitus with diabetic chronic kidney disease: Secondary | ICD-10-CM | POA: Diagnosis not present

## 2021-02-04 DIAGNOSIS — E1129 Type 2 diabetes mellitus with other diabetic kidney complication: Secondary | ICD-10-CM | POA: Diagnosis not present

## 2021-02-04 DIAGNOSIS — D508 Other iron deficiency anemias: Secondary | ICD-10-CM | POA: Diagnosis not present

## 2021-02-04 DIAGNOSIS — N189 Chronic kidney disease, unspecified: Secondary | ICD-10-CM | POA: Diagnosis not present

## 2021-02-04 DIAGNOSIS — D631 Anemia in chronic kidney disease: Secondary | ICD-10-CM | POA: Diagnosis not present

## 2021-02-04 DIAGNOSIS — I5032 Chronic diastolic (congestive) heart failure: Secondary | ICD-10-CM | POA: Diagnosis not present

## 2021-02-04 DIAGNOSIS — R809 Proteinuria, unspecified: Secondary | ICD-10-CM | POA: Diagnosis not present

## 2021-02-06 DIAGNOSIS — E1165 Type 2 diabetes mellitus with hyperglycemia: Secondary | ICD-10-CM | POA: Diagnosis not present

## 2021-02-06 DIAGNOSIS — E782 Mixed hyperlipidemia: Secondary | ICD-10-CM | POA: Diagnosis not present

## 2021-02-06 DIAGNOSIS — K7581 Nonalcoholic steatohepatitis (NASH): Secondary | ICD-10-CM | POA: Diagnosis not present

## 2021-02-06 DIAGNOSIS — R809 Proteinuria, unspecified: Secondary | ICD-10-CM | POA: Diagnosis not present

## 2021-02-06 DIAGNOSIS — E1140 Type 2 diabetes mellitus with diabetic neuropathy, unspecified: Secondary | ICD-10-CM | POA: Diagnosis not present

## 2021-02-06 DIAGNOSIS — F419 Anxiety disorder, unspecified: Secondary | ICD-10-CM | POA: Diagnosis not present

## 2021-02-06 DIAGNOSIS — I2510 Atherosclerotic heart disease of native coronary artery without angina pectoris: Secondary | ICD-10-CM | POA: Diagnosis not present

## 2021-02-06 DIAGNOSIS — D649 Anemia, unspecified: Secondary | ICD-10-CM | POA: Diagnosis not present

## 2021-02-06 DIAGNOSIS — R944 Abnormal results of kidney function studies: Secondary | ICD-10-CM | POA: Diagnosis not present

## 2021-02-06 DIAGNOSIS — I10 Essential (primary) hypertension: Secondary | ICD-10-CM | POA: Diagnosis not present

## 2021-02-07 DIAGNOSIS — E1140 Type 2 diabetes mellitus with diabetic neuropathy, unspecified: Secondary | ICD-10-CM | POA: Diagnosis not present

## 2021-02-07 DIAGNOSIS — I2510 Atherosclerotic heart disease of native coronary artery without angina pectoris: Secondary | ICD-10-CM | POA: Diagnosis not present

## 2021-02-07 DIAGNOSIS — K7581 Nonalcoholic steatohepatitis (NASH): Secondary | ICD-10-CM | POA: Diagnosis not present

## 2021-02-07 DIAGNOSIS — I10 Essential (primary) hypertension: Secondary | ICD-10-CM | POA: Diagnosis not present

## 2021-02-07 DIAGNOSIS — R944 Abnormal results of kidney function studies: Secondary | ICD-10-CM | POA: Diagnosis not present

## 2021-02-07 DIAGNOSIS — R809 Proteinuria, unspecified: Secondary | ICD-10-CM | POA: Diagnosis not present

## 2021-02-07 DIAGNOSIS — E782 Mixed hyperlipidemia: Secondary | ICD-10-CM | POA: Diagnosis not present

## 2021-02-07 DIAGNOSIS — D649 Anemia, unspecified: Secondary | ICD-10-CM | POA: Diagnosis not present

## 2021-02-07 DIAGNOSIS — F419 Anxiety disorder, unspecified: Secondary | ICD-10-CM | POA: Diagnosis not present

## 2021-04-07 DIAGNOSIS — R809 Proteinuria, unspecified: Secondary | ICD-10-CM | POA: Diagnosis not present

## 2021-04-07 DIAGNOSIS — I2510 Atherosclerotic heart disease of native coronary artery without angina pectoris: Secondary | ICD-10-CM | POA: Diagnosis not present

## 2021-04-07 DIAGNOSIS — E1122 Type 2 diabetes mellitus with diabetic chronic kidney disease: Secondary | ICD-10-CM | POA: Diagnosis not present

## 2021-04-07 DIAGNOSIS — R808 Other proteinuria: Secondary | ICD-10-CM | POA: Diagnosis not present

## 2021-04-07 DIAGNOSIS — E1165 Type 2 diabetes mellitus with hyperglycemia: Secondary | ICD-10-CM | POA: Diagnosis not present

## 2021-04-07 DIAGNOSIS — N189 Chronic kidney disease, unspecified: Secondary | ICD-10-CM | POA: Diagnosis not present

## 2021-04-07 DIAGNOSIS — I5032 Chronic diastolic (congestive) heart failure: Secondary | ICD-10-CM | POA: Diagnosis not present

## 2021-04-07 DIAGNOSIS — D631 Anemia in chronic kidney disease: Secondary | ICD-10-CM | POA: Diagnosis not present

## 2021-04-07 DIAGNOSIS — D508 Other iron deficiency anemias: Secondary | ICD-10-CM | POA: Diagnosis not present

## 2021-04-07 DIAGNOSIS — E1129 Type 2 diabetes mellitus with other diabetic kidney complication: Secondary | ICD-10-CM | POA: Diagnosis not present

## 2021-04-12 ENCOUNTER — Encounter: Payer: Self-pay | Admitting: Cardiology

## 2021-04-12 DIAGNOSIS — E1122 Type 2 diabetes mellitus with diabetic chronic kidney disease: Secondary | ICD-10-CM | POA: Diagnosis not present

## 2021-04-12 DIAGNOSIS — I5032 Chronic diastolic (congestive) heart failure: Secondary | ICD-10-CM | POA: Diagnosis not present

## 2021-04-12 DIAGNOSIS — D508 Other iron deficiency anemias: Secondary | ICD-10-CM | POA: Diagnosis not present

## 2021-04-12 DIAGNOSIS — R808 Other proteinuria: Secondary | ICD-10-CM | POA: Diagnosis not present

## 2021-04-12 DIAGNOSIS — N189 Chronic kidney disease, unspecified: Secondary | ICD-10-CM | POA: Diagnosis not present

## 2021-04-12 DIAGNOSIS — D631 Anemia in chronic kidney disease: Secondary | ICD-10-CM | POA: Diagnosis not present

## 2021-04-12 DIAGNOSIS — R809 Proteinuria, unspecified: Secondary | ICD-10-CM | POA: Diagnosis not present

## 2021-04-12 DIAGNOSIS — E1129 Type 2 diabetes mellitus with other diabetic kidney complication: Secondary | ICD-10-CM | POA: Diagnosis not present

## 2021-04-12 NOTE — Progress Notes (Signed)
Cardiology Office Note   Date:  04/13/2021   ID:  [name], DOB [birth year], MRN [id number]  PCP:  Celene Squibb, MD  Cardiologist:   Kate Sable, MD (Inactive)  Chief Complaint  Patient presents with   Coronary Artery Disease       History of Present Illness: [name] is a 65 y.o. male who presents for follow up of CAD.   He had bypass in 2011.  Since I last saw him he has done well.  The patient denies any new symptoms such as chest discomfort, neck or arm discomfort. There has been no new shortness of breath, PND or orthopnea. There have been no reported palpitations, presyncope or syncope.  He is being active walking daily.    He does have CKD and is seeing nephrology.  Wilder Glade has been suggested but he has not started it.  Creat was 1.67.    Past Medical History:  Diagnosis Date   Abnormal LFTs    Adenomatous colon polyp 2012   Allergy    to Red Meat   Anemia    Anxiety    Arteriosclerotic cardiovascular disease (ASCVD) 2003   Non-Q MI with stent to RCA in 12/2001, normal EF, additional stents to mid CX and D1; cath in 02/2005-progressive or eye disease, 50-70% mid LAD, 90% distal LAD, 50% D1, 60-70% CX, small PDA with diffuse disease, patent RCA stent; repeat cath in 01/2008-no significant progression; non-Q MI in 02/2010 with 100% mid Cx at  prior stent, nondom. RCA with patent stent, 50% RI, nl EF, CABG-04/2010   Cataract    bilateral cataracts removed   Chronic kidney disease    Creatinine of 1.44 in 04/2009   Chronic obstructive pulmonary disease ([place])    Coronary artery disease    stent 2011   Diabetes mellitus    Erectile dysfunction    Gastroesophageal reflux disease    Hepatic steatosis    Hyperlipidemia    Predominantly elevated triglycerides   Hypertension    Myocardial infarction ([place]) 2002, 2011   Neuropathy    feet   Tobacco abuse, in remission    35 pack years; discontinued 05/2010    Past Surgical History:  Procedure  Laterality Date   APPENDECTOMY  1980   CATARACT EXTRACTION W/PHACO Left 09/21/2014   Procedure: CATARACT EXTRACTION PHACO AND INTRAOCULAR LENS PLACEMENT ([place]);  Surgeon: Tonny Branch, MD;  Location: [facility];  Service: Ophthalmology;  Laterality: Left;  CDE:9.77   COLONOSCOPY     COLONOSCOPY W/ POLYPECTOMY  2012   Dr. Olevia Perches; 2 polyps removed one of which was precancerous   CORONARY ARTERY BYPASS GRAFT  05/2010   4 vessels August 2011   EYE SURGERY     HEMORRHOID SURGERY N/A 02/24/2014   Procedure: HEMORRHOIDECTOMY;  Surgeon: Joyice Faster. Cornett, MD;  Location: [place];  Service: General;  Laterality: N/A;   POLYPECTOMY     SHOULDER ARTHROSCOPY WITH OPEN ROTATOR CUFF REPAIR AND DISTAL CLAVICLE ACROMINECTOMY Left 11/23/2015   Procedure: SHOULDER ARTHROSCOPY WITH MINI-OPEN ROTATOR CUFF REPAIR, DISTAL CLAVICLE RESECTION AND SUBACROMIAL DECOMPRESSION.;  Surgeon: Garald Balding, MD;  Location: [place];  Service: Orthopedics;  Laterality: Left;  LEFT SHOULDER ARTHROSCOPIC SUBACROMIAL DECOMPRESSION, DISTAL CLAVICLE RESECTION, MINI-OPEN ROTATOR CUFF REPAIR.   UPPER GASTROINTESTINAL ENDOSCOPY       Current Outpatient Medications  Medication Sig Dispense Refill   Alpha-Lipoic Acid 600 MG CAPS Take by mouth daily.     amLODipine (NORVASC) 10  MG tablet Take 1 tablet (10 mg total) daily by mouth. 90 tablet 3   aspirin EC 81 MG tablet Take 81 mg by mouth daily.     atorvastatin (LIPITOR) 40 MG tablet TAKE 1 TABLET BY MOUTH DAILY AT 6 PM. 90 tablet 3   carvedilol (COREG) 12.5 MG tablet Take 12.5 mg by mouth 2 (two) times daily with a meal.     CONTOUR NEXT TEST test strip      dapagliflozin propanediol (FARXIGA) 10 MG TABS tablet Take 10 mg by mouth daily.     diphenhydrAMINE (BENADRYL) 25 MG tablet Take 25 mg by mouth daily.     famotidine (PEPCID) 20 MG tablet Take 20 mg by mouth daily.     Ferrous Bisglycinate Chelate 28 MG CAPS Take 25 mg by mouth daily.     losartan (COZAAR) 25 MG  tablet Take 25 mg by mouth daily.     metFORMIN (GLUCOPHAGE) 1000 MG tablet Take 500 mg by mouth 2 (two) times daily with a meal.     Multiple Vitamins-Minerals (MULTIVITAMIN WITH MINERALS) tablet Take 1 tablet by mouth daily.       TRESIBA FLEXTOUCH 200 UNIT/ML SOPN Inject 100 Units into the skin daily.  3   TURMERIC PO Take 1 tablet by mouth daily.     ALPRAZolam (XANAX) 0.5 MG tablet Take 0.5 mg by mouth 2 (two) times daily as needed.  (Patient not taking: Reported on 04/13/2021)     ferrous sulfate 325 (65 FE) MG EC tablet Take 325 mg by mouth daily with breakfast. (Patient not taking: Reported on 04/13/2021)     Current Facility-Administered Medications  Medication Dose Route Frequency Provider Last Rate Last Admin   EPINEPHrine (EPI-PEN) injection 0.3 mg  0.3 mg Intramuscular Once Particia Nearing S, PA-C        Allergies:   Ace inhibitors, Lisinopril, Beef-derived products, Other, Pegademase bovine, Ranexa [ranolazine], and Wellbutrin [bupropion hcl]    ROS:  Please see the history of present illness.   Otherwise, review of systems are positive for none.   All other systems are reviewed and negative.    PHYSICAL EXAM: VS:  BP (!) 148/72   Pulse 61   Ht 5\' 5"  (1.651 m)   Wt 190 lb 3.2 oz (86.3 kg)   SpO2 98%   BMI 31.65 kg/m  , BMI Body mass index is 31.65 kg/m. GENERAL:  Well appearing NECK:  No jugular venous distention, waveform within normal limits, carotid upstroke brisk and symmetric, no bruits, no thyromegaly LUNGS:  Clear to auscultation bilaterally CHEST:  Unremarkable HEART:  PMI not displaced or sustained,S1 and S2 within normal limits, no S3, no S4, no clicks, no rubs, apical systolic murmur, no diastolic murmurs ABD:  Flat, positive bowel sounds normal in frequency in pitch, no bruits, no rebound, no guarding, no midline pulsatile mass, no hepatomegaly, no splenomegaly EXT:  2 plus pulses throughout, no edema, no cyanosis no clubbing   EKG:  EKG is  ordered today. The  ekg ordered today demonstrates sinus rhythm, rate 61, axis within normal limits, intervals within normal limits, no acute ST-T wave changes.   Recent Labs: No results found for requested labs within last 8760 hours.    Lipid Panel    Component Value Date/Time   CHOL 140 02/04/2010 0000   TRIG 313 (H) 02/04/2010 0000   HDL 30 (L) 02/04/2010 0000   CHOLHDL 4.7 Ratio 02/04/2010 0000   VLDL 63 (H) 02/04/2010 0000  LDLCALC 47 02/04/2010 0000      Wt Readings from Last 3 Encounters:  04/13/21 190 lb 3.2 oz (86.3 kg)  01/28/20 185 lb (83.9 kg)  10/09/19 192 lb (87.1 kg)      Other studies Reviewed: Additional studies/ records that were reviewed today include: Labs Review of the above records demonstrates:  Please see elsewhere in the note.     ASSESSMENT AND PLAN:    CAD The patient has no new sypmtoms.  No further cardiovascular testing is indicated.  We will continue with aggressive risk reduction and meds as listed.  HTN The blood pressure is well controlled.  No change in therapy.    Hyperlipidemia    LDL was 52 with and HDL of 44.  He will continue current therapy.   DM His A1c most recently was 7.1.   He will continue as listed.   MURMUR: He had a very minimal gradient across the aortic valve in 2017.   I will repeat an echo this year as the murmur is slightly louder.     Current medicines are reviewed at length with the patient today.  The patient does not have concerns regarding medicines.  The following changes have been made:  None  Labs/ tests ordered today include: Echo  Orders Placed This Encounter  Procedures   EKG 12-Lead      Disposition:   FU with in in one year.    Signed, Minus Breeding, MD  04/13/2021 2:57 PM     [facility]

## 2021-04-13 ENCOUNTER — Other Ambulatory Visit: Payer: Self-pay

## 2021-04-13 ENCOUNTER — Encounter: Payer: Self-pay | Admitting: Cardiology

## 2021-04-13 ENCOUNTER — Ambulatory Visit (INDEPENDENT_AMBULATORY_CARE_PROVIDER_SITE_OTHER): Payer: Medicare HMO | Admitting: Cardiology

## 2021-04-13 VITALS — BP 148/72 | HR 61 | Ht 65.0 in | Wt 190.2 lb

## 2021-04-13 DIAGNOSIS — R011 Cardiac murmur, unspecified: Secondary | ICD-10-CM | POA: Diagnosis not present

## 2021-04-13 DIAGNOSIS — I2510 Atherosclerotic heart disease of native coronary artery without angina pectoris: Secondary | ICD-10-CM

## 2021-04-13 DIAGNOSIS — E785 Hyperlipidemia, unspecified: Secondary | ICD-10-CM | POA: Diagnosis not present

## 2021-04-13 DIAGNOSIS — I10 Essential (primary) hypertension: Secondary | ICD-10-CM

## 2021-04-13 DIAGNOSIS — E118 Type 2 diabetes mellitus with unspecified complications: Secondary | ICD-10-CM

## 2021-04-13 NOTE — Addendum Note (Signed)
Addended by: Shellia Cleverly on: 04/13/2021 03:56 PM   Modules accepted: Orders

## 2021-04-13 NOTE — Patient Instructions (Addendum)
Medication Instructions:  The current medical regimen is effective;  continue present plan and medications.  *If you need a refill on your cardiac medications before your next appointment, please call your pharmacy.  Your physician has requested that you have an echocardiogram. Echocardiography is a painless test that uses sound waves to create images of your heart. It provides your doctor with information about the size and shape of your heart and how well your heart's chambers and valves are working. This procedure takes approximately one hour. There are no restrictions for this procedure. This will testing will be completed at [facility].  You will be contacted to be scheduled.  Follow-Up: At [facility], you and your health needs are our priority.  As part of our continuing mission to provide you with exceptional heart care, we have created designated Provider Care Teams.  These Care Teams include your primary Cardiologist (physician) and Advanced Practice Providers (APPs -  Physician Assistants and Nurse Practitioners) who all work together to provide you with the care you need, when you need it.  We recommend signing up for the patient portal called "MyChart".  Sign up information is provided on this After Visit Summary.  MyChart is used to connect with patients for Virtual Visits (Telemedicine).  Patients are able to view lab/test results, encounter notes, upcoming appointments, etc.  Non-urgent messages can be sent to your provider as well.   To learn more about what you can do with MyChart, go to [email or web address].    Your next appointment:   1 year(s)  The format for your next appointment:   In Person  Provider:   Minus Breeding, MD   Thank you for choosing [facility]!!

## 2021-04-14 DIAGNOSIS — D631 Anemia in chronic kidney disease: Secondary | ICD-10-CM | POA: Diagnosis not present

## 2021-04-14 DIAGNOSIS — D508 Other iron deficiency anemias: Secondary | ICD-10-CM | POA: Diagnosis not present

## 2021-04-14 DIAGNOSIS — E1122 Type 2 diabetes mellitus with diabetic chronic kidney disease: Secondary | ICD-10-CM | POA: Diagnosis not present

## 2021-04-14 DIAGNOSIS — I129 Hypertensive chronic kidney disease with stage 1 through stage 4 chronic kidney disease, or unspecified chronic kidney disease: Secondary | ICD-10-CM | POA: Diagnosis not present

## 2021-04-14 DIAGNOSIS — R808 Other proteinuria: Secondary | ICD-10-CM | POA: Diagnosis not present

## 2021-04-14 DIAGNOSIS — I5032 Chronic diastolic (congestive) heart failure: Secondary | ICD-10-CM | POA: Diagnosis not present

## 2021-04-14 DIAGNOSIS — N189 Chronic kidney disease, unspecified: Secondary | ICD-10-CM | POA: Diagnosis not present

## 2021-04-26 ENCOUNTER — Other Ambulatory Visit: Payer: Self-pay

## 2021-04-26 ENCOUNTER — Ambulatory Visit (HOSPITAL_COMMUNITY)
Admission: RE | Admit: 2021-04-26 | Discharge: 2021-04-26 | Disposition: A | Discharge status: Home / Self Care | Payer: Medicare HMO | Source: Ambulatory Visit | Attending: Cardiology | Admitting: Cardiology

## 2021-04-26 DIAGNOSIS — R011 Cardiac murmur, unspecified: Secondary | ICD-10-CM | POA: Insufficient documentation

## 2021-04-26 LAB — ECHOCARDIOGRAM COMPLETE
Area-P 1/2: 4.08 cm2
S' Lateral: 2.85 cm

## 2021-04-26 NOTE — Progress Notes (Signed)
*  PRELIMINARY RESULTS* Echocardiogram 2D Echocardiogram has been performed.  [name] 04/26/2021, 10:29 AM

## 2021-04-28 DIAGNOSIS — E1165 Type 2 diabetes mellitus with hyperglycemia: Secondary | ICD-10-CM | POA: Diagnosis not present

## 2021-04-28 DIAGNOSIS — I2510 Atherosclerotic heart disease of native coronary artery without angina pectoris: Secondary | ICD-10-CM | POA: Diagnosis not present

## 2021-05-13 DIAGNOSIS — D509 Iron deficiency anemia, unspecified: Secondary | ICD-10-CM | POA: Insufficient documentation

## 2021-05-16 DIAGNOSIS — I10 Essential (primary) hypertension: Secondary | ICD-10-CM | POA: Diagnosis not present

## 2021-05-16 DIAGNOSIS — D509 Iron deficiency anemia, unspecified: Secondary | ICD-10-CM | POA: Diagnosis not present

## 2021-05-16 DIAGNOSIS — E1165 Type 2 diabetes mellitus with hyperglycemia: Secondary | ICD-10-CM | POA: Diagnosis not present

## 2021-05-18 ENCOUNTER — Encounter: Payer: Self-pay | Admitting: Gastroenterology

## 2021-05-23 DIAGNOSIS — E7800 Pure hypercholesterolemia, unspecified: Secondary | ICD-10-CM | POA: Diagnosis not present

## 2021-05-23 DIAGNOSIS — E119 Type 2 diabetes mellitus without complications: Secondary | ICD-10-CM | POA: Diagnosis not present

## 2021-05-23 DIAGNOSIS — H5200 Hypermetropia, unspecified eye: Secondary | ICD-10-CM | POA: Diagnosis not present

## 2021-05-28 DIAGNOSIS — I2510 Atherosclerotic heart disease of native coronary artery without angina pectoris: Secondary | ICD-10-CM | POA: Diagnosis not present

## 2021-05-28 DIAGNOSIS — R809 Proteinuria, unspecified: Secondary | ICD-10-CM | POA: Diagnosis not present

## 2021-05-28 DIAGNOSIS — D509 Iron deficiency anemia, unspecified: Secondary | ICD-10-CM | POA: Diagnosis not present

## 2021-05-28 DIAGNOSIS — N1831 Chronic kidney disease, stage 3a: Secondary | ICD-10-CM | POA: Diagnosis not present

## 2021-05-28 DIAGNOSIS — F419 Anxiety disorder, unspecified: Secondary | ICD-10-CM | POA: Diagnosis not present

## 2021-05-28 DIAGNOSIS — K7581 Nonalcoholic steatohepatitis (NASH): Secondary | ICD-10-CM | POA: Diagnosis not present

## 2021-05-28 DIAGNOSIS — I10 Essential (primary) hypertension: Secondary | ICD-10-CM | POA: Diagnosis not present

## 2021-05-28 DIAGNOSIS — E782 Mixed hyperlipidemia: Secondary | ICD-10-CM | POA: Diagnosis not present

## 2021-05-28 DIAGNOSIS — E1142 Type 2 diabetes mellitus with diabetic polyneuropathy: Secondary | ICD-10-CM | POA: Diagnosis not present

## 2021-06-09 DIAGNOSIS — H524 Presbyopia: Secondary | ICD-10-CM | POA: Diagnosis not present

## 2021-06-09 DIAGNOSIS — H52223 Regular astigmatism, bilateral: Secondary | ICD-10-CM | POA: Diagnosis not present

## 2021-06-17 DIAGNOSIS — E1122 Type 2 diabetes mellitus with diabetic chronic kidney disease: Secondary | ICD-10-CM | POA: Diagnosis not present

## 2021-06-17 DIAGNOSIS — D508 Other iron deficiency anemias: Secondary | ICD-10-CM | POA: Diagnosis not present

## 2021-06-17 DIAGNOSIS — R808 Other proteinuria: Secondary | ICD-10-CM | POA: Diagnosis not present

## 2021-06-17 DIAGNOSIS — N189 Chronic kidney disease, unspecified: Secondary | ICD-10-CM | POA: Diagnosis not present

## 2021-06-17 DIAGNOSIS — D631 Anemia in chronic kidney disease: Secondary | ICD-10-CM | POA: Diagnosis not present

## 2021-06-23 DIAGNOSIS — D631 Anemia in chronic kidney disease: Secondary | ICD-10-CM | POA: Diagnosis not present

## 2021-06-23 DIAGNOSIS — I5032 Chronic diastolic (congestive) heart failure: Secondary | ICD-10-CM | POA: Diagnosis not present

## 2021-06-23 DIAGNOSIS — E1122 Type 2 diabetes mellitus with diabetic chronic kidney disease: Secondary | ICD-10-CM | POA: Diagnosis not present

## 2021-06-23 DIAGNOSIS — I129 Hypertensive chronic kidney disease with stage 1 through stage 4 chronic kidney disease, or unspecified chronic kidney disease: Secondary | ICD-10-CM | POA: Diagnosis not present

## 2021-06-23 DIAGNOSIS — N189 Chronic kidney disease, unspecified: Secondary | ICD-10-CM | POA: Diagnosis not present

## 2021-06-23 DIAGNOSIS — R808 Other proteinuria: Secondary | ICD-10-CM | POA: Diagnosis not present

## 2021-06-24 DIAGNOSIS — M722 Plantar fascial fibromatosis: Secondary | ICD-10-CM | POA: Diagnosis not present

## 2021-06-24 DIAGNOSIS — M7732 Calcaneal spur, left foot: Secondary | ICD-10-CM | POA: Diagnosis not present

## 2021-06-24 DIAGNOSIS — M79672 Pain in left foot: Secondary | ICD-10-CM | POA: Diagnosis not present

## 2021-06-24 DIAGNOSIS — M71572 Other bursitis, not elsewhere classified, left ankle and foot: Secondary | ICD-10-CM | POA: Diagnosis not present

## 2021-06-29 ENCOUNTER — Ambulatory Visit: Payer: Medicare HMO | Admitting: Podiatry

## 2021-07-08 DIAGNOSIS — M71572 Other bursitis, not elsewhere classified, left ankle and foot: Secondary | ICD-10-CM | POA: Diagnosis not present

## 2021-07-08 DIAGNOSIS — M722 Plantar fascial fibromatosis: Secondary | ICD-10-CM | POA: Diagnosis not present

## 2021-08-22 DIAGNOSIS — I5032 Chronic diastolic (congestive) heart failure: Secondary | ICD-10-CM | POA: Diagnosis not present

## 2021-08-22 DIAGNOSIS — I129 Hypertensive chronic kidney disease with stage 1 through stage 4 chronic kidney disease, or unspecified chronic kidney disease: Secondary | ICD-10-CM | POA: Diagnosis not present

## 2021-08-22 DIAGNOSIS — N189 Chronic kidney disease, unspecified: Secondary | ICD-10-CM | POA: Diagnosis not present

## 2021-08-22 DIAGNOSIS — E1122 Type 2 diabetes mellitus with diabetic chronic kidney disease: Secondary | ICD-10-CM | POA: Diagnosis not present

## 2021-08-22 DIAGNOSIS — D631 Anemia in chronic kidney disease: Secondary | ICD-10-CM | POA: Diagnosis not present

## 2021-08-22 DIAGNOSIS — R808 Other proteinuria: Secondary | ICD-10-CM | POA: Diagnosis not present

## 2021-08-25 DIAGNOSIS — R808 Other proteinuria: Secondary | ICD-10-CM | POA: Diagnosis not present

## 2021-08-25 DIAGNOSIS — E1122 Type 2 diabetes mellitus with diabetic chronic kidney disease: Secondary | ICD-10-CM | POA: Diagnosis not present

## 2021-08-25 DIAGNOSIS — N189 Chronic kidney disease, unspecified: Secondary | ICD-10-CM | POA: Diagnosis not present

## 2021-08-25 DIAGNOSIS — I129 Hypertensive chronic kidney disease with stage 1 through stage 4 chronic kidney disease, or unspecified chronic kidney disease: Secondary | ICD-10-CM | POA: Diagnosis not present

## 2021-08-25 DIAGNOSIS — I5032 Chronic diastolic (congestive) heart failure: Secondary | ICD-10-CM | POA: Diagnosis not present

## 2021-08-25 DIAGNOSIS — D631 Anemia in chronic kidney disease: Secondary | ICD-10-CM | POA: Diagnosis not present

## 2021-09-07 DIAGNOSIS — I10 Essential (primary) hypertension: Secondary | ICD-10-CM | POA: Diagnosis not present

## 2021-09-07 DIAGNOSIS — E782 Mixed hyperlipidemia: Secondary | ICD-10-CM | POA: Diagnosis not present

## 2021-09-20 DIAGNOSIS — E782 Mixed hyperlipidemia: Secondary | ICD-10-CM | POA: Diagnosis not present

## 2021-09-20 DIAGNOSIS — D509 Iron deficiency anemia, unspecified: Secondary | ICD-10-CM | POA: Diagnosis not present

## 2021-09-20 DIAGNOSIS — E1142 Type 2 diabetes mellitus with diabetic polyneuropathy: Secondary | ICD-10-CM | POA: Diagnosis not present

## 2021-09-20 DIAGNOSIS — L570 Actinic keratosis: Secondary | ICD-10-CM | POA: Diagnosis not present

## 2021-09-27 DIAGNOSIS — E1142 Type 2 diabetes mellitus with diabetic polyneuropathy: Secondary | ICD-10-CM | POA: Insufficient documentation

## 2021-09-27 DIAGNOSIS — F419 Anxiety disorder, unspecified: Secondary | ICD-10-CM | POA: Diagnosis not present

## 2021-09-27 DIAGNOSIS — Z0001 Encounter for general adult medical examination with abnormal findings: Secondary | ICD-10-CM | POA: Diagnosis not present

## 2021-09-27 DIAGNOSIS — I10 Essential (primary) hypertension: Secondary | ICD-10-CM | POA: Diagnosis not present

## 2021-09-27 DIAGNOSIS — D509 Iron deficiency anemia, unspecified: Secondary | ICD-10-CM | POA: Diagnosis not present

## 2021-09-27 DIAGNOSIS — R809 Proteinuria, unspecified: Secondary | ICD-10-CM | POA: Diagnosis not present

## 2021-09-27 DIAGNOSIS — I2510 Atherosclerotic heart disease of native coronary artery without angina pectoris: Secondary | ICD-10-CM | POA: Diagnosis not present

## 2021-09-27 DIAGNOSIS — N1831 Chronic kidney disease, stage 3a: Secondary | ICD-10-CM | POA: Insufficient documentation

## 2021-09-27 DIAGNOSIS — K7581 Nonalcoholic steatohepatitis (NASH): Secondary | ICD-10-CM | POA: Insufficient documentation

## 2021-10-20 DIAGNOSIS — E559 Vitamin D deficiency, unspecified: Secondary | ICD-10-CM | POA: Diagnosis not present

## 2021-10-20 DIAGNOSIS — D631 Anemia in chronic kidney disease: Secondary | ICD-10-CM | POA: Diagnosis not present

## 2021-10-20 DIAGNOSIS — E1122 Type 2 diabetes mellitus with diabetic chronic kidney disease: Secondary | ICD-10-CM | POA: Diagnosis not present

## 2021-10-20 DIAGNOSIS — R808 Other proteinuria: Secondary | ICD-10-CM | POA: Diagnosis not present

## 2021-10-20 DIAGNOSIS — I5032 Chronic diastolic (congestive) heart failure: Secondary | ICD-10-CM | POA: Diagnosis not present

## 2021-10-20 DIAGNOSIS — I129 Hypertensive chronic kidney disease with stage 1 through stage 4 chronic kidney disease, or unspecified chronic kidney disease: Secondary | ICD-10-CM | POA: Diagnosis not present

## 2021-10-20 DIAGNOSIS — N189 Chronic kidney disease, unspecified: Secondary | ICD-10-CM | POA: Diagnosis not present

## 2021-10-25 DIAGNOSIS — E1122 Type 2 diabetes mellitus with diabetic chronic kidney disease: Secondary | ICD-10-CM | POA: Diagnosis not present

## 2021-10-25 DIAGNOSIS — I5032 Chronic diastolic (congestive) heart failure: Secondary | ICD-10-CM | POA: Diagnosis not present

## 2021-10-25 DIAGNOSIS — I129 Hypertensive chronic kidney disease with stage 1 through stage 4 chronic kidney disease, or unspecified chronic kidney disease: Secondary | ICD-10-CM | POA: Diagnosis not present

## 2021-10-25 DIAGNOSIS — R808 Other proteinuria: Secondary | ICD-10-CM | POA: Diagnosis not present

## 2021-10-25 DIAGNOSIS — D631 Anemia in chronic kidney disease: Secondary | ICD-10-CM | POA: Diagnosis not present

## 2021-10-25 DIAGNOSIS — N189 Chronic kidney disease, unspecified: Secondary | ICD-10-CM | POA: Diagnosis not present

## 2021-12-15 DIAGNOSIS — I129 Hypertensive chronic kidney disease with stage 1 through stage 4 chronic kidney disease, or unspecified chronic kidney disease: Secondary | ICD-10-CM | POA: Diagnosis not present

## 2021-12-15 DIAGNOSIS — R808 Other proteinuria: Secondary | ICD-10-CM | POA: Diagnosis not present

## 2021-12-15 DIAGNOSIS — N189 Chronic kidney disease, unspecified: Secondary | ICD-10-CM | POA: Diagnosis not present

## 2021-12-15 DIAGNOSIS — E1122 Type 2 diabetes mellitus with diabetic chronic kidney disease: Secondary | ICD-10-CM | POA: Diagnosis not present

## 2021-12-15 DIAGNOSIS — D631 Anemia in chronic kidney disease: Secondary | ICD-10-CM | POA: Diagnosis not present

## 2021-12-15 DIAGNOSIS — I5032 Chronic diastolic (congestive) heart failure: Secondary | ICD-10-CM | POA: Diagnosis not present

## 2022-01-05 DIAGNOSIS — I5032 Chronic diastolic (congestive) heart failure: Secondary | ICD-10-CM | POA: Diagnosis not present

## 2022-01-05 DIAGNOSIS — D508 Other iron deficiency anemias: Secondary | ICD-10-CM | POA: Diagnosis not present

## 2022-01-05 DIAGNOSIS — R808 Other proteinuria: Secondary | ICD-10-CM | POA: Diagnosis not present

## 2022-01-05 DIAGNOSIS — N189 Chronic kidney disease, unspecified: Secondary | ICD-10-CM | POA: Diagnosis not present

## 2022-01-05 DIAGNOSIS — I129 Hypertensive chronic kidney disease with stage 1 through stage 4 chronic kidney disease, or unspecified chronic kidney disease: Secondary | ICD-10-CM | POA: Diagnosis not present

## 2022-01-05 DIAGNOSIS — D631 Anemia in chronic kidney disease: Secondary | ICD-10-CM | POA: Diagnosis not present

## 2022-01-05 DIAGNOSIS — E1122 Type 2 diabetes mellitus with diabetic chronic kidney disease: Secondary | ICD-10-CM | POA: Diagnosis not present

## 2022-01-30 DIAGNOSIS — Z125 Encounter for screening for malignant neoplasm of prostate: Secondary | ICD-10-CM | POA: Diagnosis not present

## 2022-01-30 DIAGNOSIS — D509 Iron deficiency anemia, unspecified: Secondary | ICD-10-CM | POA: Diagnosis not present

## 2022-01-30 DIAGNOSIS — E1142 Type 2 diabetes mellitus with diabetic polyneuropathy: Secondary | ICD-10-CM | POA: Diagnosis not present

## 2022-01-30 DIAGNOSIS — E782 Mixed hyperlipidemia: Secondary | ICD-10-CM | POA: Diagnosis not present

## 2022-02-01 DIAGNOSIS — I129 Hypertensive chronic kidney disease with stage 1 through stage 4 chronic kidney disease, or unspecified chronic kidney disease: Secondary | ICD-10-CM | POA: Diagnosis not present

## 2022-02-01 DIAGNOSIS — D631 Anemia in chronic kidney disease: Secondary | ICD-10-CM | POA: Diagnosis not present

## 2022-02-01 DIAGNOSIS — N189 Chronic kidney disease, unspecified: Secondary | ICD-10-CM | POA: Diagnosis not present

## 2022-02-01 DIAGNOSIS — I5032 Chronic diastolic (congestive) heart failure: Secondary | ICD-10-CM | POA: Insufficient documentation

## 2022-02-01 DIAGNOSIS — D508 Other iron deficiency anemias: Secondary | ICD-10-CM | POA: Diagnosis not present

## 2022-02-01 DIAGNOSIS — F419 Anxiety disorder, unspecified: Secondary | ICD-10-CM | POA: Diagnosis not present

## 2022-02-01 DIAGNOSIS — E782 Mixed hyperlipidemia: Secondary | ICD-10-CM | POA: Diagnosis not present

## 2022-02-01 DIAGNOSIS — R808 Other proteinuria: Secondary | ICD-10-CM | POA: Diagnosis not present

## 2022-02-01 DIAGNOSIS — N1831 Chronic kidney disease, stage 3a: Secondary | ICD-10-CM | POA: Diagnosis not present

## 2022-02-01 DIAGNOSIS — D509 Iron deficiency anemia, unspecified: Secondary | ICD-10-CM | POA: Diagnosis not present

## 2022-02-01 DIAGNOSIS — E1122 Type 2 diabetes mellitus with diabetic chronic kidney disease: Secondary | ICD-10-CM | POA: Diagnosis not present

## 2022-02-01 DIAGNOSIS — I2510 Atherosclerotic heart disease of native coronary artery without angina pectoris: Secondary | ICD-10-CM | POA: Diagnosis not present

## 2022-02-01 DIAGNOSIS — K7581 Nonalcoholic steatohepatitis (NASH): Secondary | ICD-10-CM | POA: Diagnosis not present

## 2022-02-01 DIAGNOSIS — E1142 Type 2 diabetes mellitus with diabetic polyneuropathy: Secondary | ICD-10-CM | POA: Diagnosis not present

## 2022-02-01 DIAGNOSIS — R809 Proteinuria, unspecified: Secondary | ICD-10-CM | POA: Diagnosis not present

## 2022-02-01 DIAGNOSIS — I10 Essential (primary) hypertension: Secondary | ICD-10-CM | POA: Diagnosis not present

## 2022-02-27 DIAGNOSIS — E113393 Type 2 diabetes mellitus with moderate nonproliferative diabetic retinopathy without macular edema, bilateral: Secondary | ICD-10-CM | POA: Diagnosis not present

## 2022-02-27 DIAGNOSIS — H35363 Drusen (degenerative) of macula, bilateral: Secondary | ICD-10-CM | POA: Diagnosis not present

## 2022-02-27 DIAGNOSIS — H43393 Other vitreous opacities, bilateral: Secondary | ICD-10-CM | POA: Diagnosis not present

## 2022-02-27 DIAGNOSIS — H43813 Vitreous degeneration, bilateral: Secondary | ICD-10-CM | POA: Diagnosis not present

## 2022-03-09 DIAGNOSIS — Z79899 Other long term (current) drug therapy: Secondary | ICD-10-CM | POA: Diagnosis not present

## 2022-03-09 DIAGNOSIS — R808 Other proteinuria: Secondary | ICD-10-CM | POA: Diagnosis not present

## 2022-03-09 DIAGNOSIS — D508 Other iron deficiency anemias: Secondary | ICD-10-CM | POA: Diagnosis not present

## 2022-03-09 DIAGNOSIS — E1129 Type 2 diabetes mellitus with other diabetic kidney complication: Secondary | ICD-10-CM | POA: Diagnosis not present

## 2022-03-09 DIAGNOSIS — N200 Calculus of kidney: Secondary | ICD-10-CM | POA: Diagnosis not present

## 2022-03-09 DIAGNOSIS — N189 Chronic kidney disease, unspecified: Secondary | ICD-10-CM | POA: Diagnosis not present

## 2022-03-09 DIAGNOSIS — E1122 Type 2 diabetes mellitus with diabetic chronic kidney disease: Secondary | ICD-10-CM | POA: Diagnosis not present

## 2022-03-09 DIAGNOSIS — R809 Proteinuria, unspecified: Secondary | ICD-10-CM | POA: Diagnosis not present

## 2022-03-09 DIAGNOSIS — I5032 Chronic diastolic (congestive) heart failure: Secondary | ICD-10-CM | POA: Diagnosis not present

## 2022-03-15 DIAGNOSIS — D631 Anemia in chronic kidney disease: Secondary | ICD-10-CM | POA: Diagnosis not present

## 2022-03-15 DIAGNOSIS — E1122 Type 2 diabetes mellitus with diabetic chronic kidney disease: Secondary | ICD-10-CM | POA: Diagnosis not present

## 2022-03-15 DIAGNOSIS — I5032 Chronic diastolic (congestive) heart failure: Secondary | ICD-10-CM | POA: Diagnosis not present

## 2022-03-15 DIAGNOSIS — R808 Other proteinuria: Secondary | ICD-10-CM | POA: Diagnosis not present

## 2022-03-15 DIAGNOSIS — N189 Chronic kidney disease, unspecified: Secondary | ICD-10-CM | POA: Diagnosis not present

## 2022-03-15 DIAGNOSIS — I129 Hypertensive chronic kidney disease with stage 1 through stage 4 chronic kidney disease, or unspecified chronic kidney disease: Secondary | ICD-10-CM | POA: Diagnosis not present

## 2022-05-11 DIAGNOSIS — N189 Chronic kidney disease, unspecified: Secondary | ICD-10-CM | POA: Diagnosis not present

## 2022-05-11 DIAGNOSIS — D631 Anemia in chronic kidney disease: Secondary | ICD-10-CM | POA: Diagnosis not present

## 2022-05-11 DIAGNOSIS — R808 Other proteinuria: Secondary | ICD-10-CM | POA: Diagnosis not present

## 2022-05-11 DIAGNOSIS — I5032 Chronic diastolic (congestive) heart failure: Secondary | ICD-10-CM | POA: Diagnosis not present

## 2022-05-11 DIAGNOSIS — I129 Hypertensive chronic kidney disease with stage 1 through stage 4 chronic kidney disease, or unspecified chronic kidney disease: Secondary | ICD-10-CM | POA: Diagnosis not present

## 2022-05-11 DIAGNOSIS — E1122 Type 2 diabetes mellitus with diabetic chronic kidney disease: Secondary | ICD-10-CM | POA: Diagnosis not present

## 2022-05-17 DIAGNOSIS — E1122 Type 2 diabetes mellitus with diabetic chronic kidney disease: Secondary | ICD-10-CM | POA: Diagnosis not present

## 2022-05-17 DIAGNOSIS — D631 Anemia in chronic kidney disease: Secondary | ICD-10-CM | POA: Diagnosis not present

## 2022-05-17 DIAGNOSIS — R808 Other proteinuria: Secondary | ICD-10-CM | POA: Diagnosis not present

## 2022-05-17 DIAGNOSIS — N189 Chronic kidney disease, unspecified: Secondary | ICD-10-CM | POA: Diagnosis not present

## 2022-05-17 DIAGNOSIS — I5032 Chronic diastolic (congestive) heart failure: Secondary | ICD-10-CM | POA: Diagnosis not present

## 2022-05-17 DIAGNOSIS — N19 Unspecified kidney failure: Secondary | ICD-10-CM | POA: Diagnosis not present

## 2022-05-17 DIAGNOSIS — I129 Hypertensive chronic kidney disease with stage 1 through stage 4 chronic kidney disease, or unspecified chronic kidney disease: Secondary | ICD-10-CM | POA: Diagnosis not present

## 2022-05-30 DIAGNOSIS — D509 Iron deficiency anemia, unspecified: Secondary | ICD-10-CM | POA: Diagnosis not present

## 2022-05-30 DIAGNOSIS — E1142 Type 2 diabetes mellitus with diabetic polyneuropathy: Secondary | ICD-10-CM | POA: Diagnosis not present

## 2022-05-30 DIAGNOSIS — E782 Mixed hyperlipidemia: Secondary | ICD-10-CM | POA: Diagnosis not present

## 2022-06-06 DIAGNOSIS — I2510 Atherosclerotic heart disease of native coronary artery without angina pectoris: Secondary | ICD-10-CM | POA: Diagnosis not present

## 2022-06-06 DIAGNOSIS — K222 Esophageal obstruction: Secondary | ICD-10-CM | POA: Insufficient documentation

## 2022-06-06 DIAGNOSIS — F419 Anxiety disorder, unspecified: Secondary | ICD-10-CM | POA: Insufficient documentation

## 2022-06-06 DIAGNOSIS — I5032 Chronic diastolic (congestive) heart failure: Secondary | ICD-10-CM | POA: Diagnosis not present

## 2022-06-06 DIAGNOSIS — N1832 Chronic kidney disease, stage 3b: Secondary | ICD-10-CM | POA: Diagnosis not present

## 2022-06-06 DIAGNOSIS — J309 Allergic rhinitis, unspecified: Secondary | ICD-10-CM | POA: Insufficient documentation

## 2022-06-06 DIAGNOSIS — E1142 Type 2 diabetes mellitus with diabetic polyneuropathy: Secondary | ICD-10-CM | POA: Diagnosis not present

## 2022-06-06 DIAGNOSIS — K7581 Nonalcoholic steatohepatitis (NASH): Secondary | ICD-10-CM | POA: Diagnosis not present

## 2022-06-06 DIAGNOSIS — E782 Mixed hyperlipidemia: Secondary | ICD-10-CM | POA: Diagnosis not present

## 2022-06-06 DIAGNOSIS — I10 Essential (primary) hypertension: Secondary | ICD-10-CM | POA: Diagnosis not present

## 2022-06-06 DIAGNOSIS — D509 Iron deficiency anemia, unspecified: Secondary | ICD-10-CM | POA: Diagnosis not present

## 2022-06-06 NOTE — Progress Notes (Unsigned)
Cardiology Office Note   Date:  06/07/2022   ID:  [name], [name] March 18, 1956, MRN [id number]  PCP:  Celene Squibb, MD  Cardiologist:   Kate Sable, MD (Inactive)    Chief Complaint  Patient presents with   Coronary Artery Disease      History of Present Illness: [name] is a 66 y.o. male who presents for follow up of CAD.   He had bypass in 2011.  Since I last saw him he has done well.  The patient denies any new symptoms such as chest discomfort, neck or arm discomfort. There has been no new shortness of breath, PND or orthopnea. There have been no reported palpitations, presyncope or syncope.   He did a garden remains very busy around his house with his chickens.    He walks for exercise.    Past Medical History:  Diagnosis Date   Abnormal LFTs    Adenomatous colon polyp 2012   Allergy    to Red Meat   Anemia    Anxiety    Arteriosclerotic cardiovascular disease (ASCVD) 2003   Non-Q MI with stent to RCA in 12/2001, normal EF, additional stents to mid CX and D1; cath in 02/2005-progressive or eye disease, 50-70% mid LAD, 90% distal LAD, 50% D1, 60-70% CX, small PDA with diffuse disease, patent RCA stent; repeat cath in 01/2008-no significant progression; non-Q MI in 02/2010 with 100% mid Cx at  prior stent, nondom. RCA with patent stent, 50% RI, nl EF, CABG-04/2010   Cataract    bilateral cataracts removed   Chronic kidney disease    Creatinine of 1.44 in 04/2009   Chronic obstructive pulmonary disease ([place])    Coronary artery disease    stent 2011   Diabetes mellitus    Erectile dysfunction    Gastroesophageal reflux disease    Hepatic steatosis    Hyperlipidemia    Predominantly elevated triglycerides   Hypertension    Myocardial infarction ([place]) 2002, 2011   Neuropathy    feet   Tobacco abuse, in remission    35 pack years; discontinued 05/2010    Past Surgical History:  Procedure Laterality Date   APPENDECTOMY  1980   CATARACT EXTRACTION  W/PHACO Left 09/21/2014   Procedure: CATARACT EXTRACTION PHACO AND INTRAOCULAR LENS PLACEMENT ([place]);  Surgeon: Tonny Branch, MD;  Location: [facility];  Service: Ophthalmology;  Laterality: Left;  CDE:9.77   COLONOSCOPY     COLONOSCOPY W/ POLYPECTOMY  2012   Dr. Olevia Perches; 2 polyps removed one of which was precancerous   CORONARY ARTERY BYPASS GRAFT  05/2010   4 vessels August 2011   EYE SURGERY     HEMORRHOID SURGERY N/A 02/24/2014   Procedure: HEMORRHOIDECTOMY;  Surgeon: Joyice Faster. Cornett, MD;  Location: [facility];  Service: General;  Laterality: N/A;   POLYPECTOMY     SHOULDER ARTHROSCOPY WITH OPEN ROTATOR CUFF REPAIR AND DISTAL CLAVICLE ACROMINECTOMY Left 11/23/2015   Procedure: SHOULDER ARTHROSCOPY WITH MINI-OPEN ROTATOR CUFF REPAIR, DISTAL CLAVICLE RESECTION AND SUBACROMIAL DECOMPRESSION.;  Surgeon: Garald Balding, MD;  Location: [facility];  Service: Orthopedics;  Laterality: Left;  LEFT SHOULDER ARTHROSCOPIC SUBACROMIAL DECOMPRESSION, DISTAL CLAVICLE RESECTION, MINI-OPEN ROTATOR CUFF REPAIR.   UPPER GASTROINTESTINAL ENDOSCOPY       Current Outpatient Medications  Medication Sig Dispense Refill   ALPRAZolam (XANAX) 0.5 MG tablet Take 0.5 mg by mouth 2 (two) times daily as needed.     amLODipine (NORVASC) 10 MG  tablet Take 1 tablet (10 mg total) daily by mouth. 90 tablet 3   aspirin EC 81 MG tablet Take 81 mg by mouth daily.     atorvastatin (LIPITOR) 40 MG tablet TAKE 1 TABLET BY MOUTH DAILY AT 6 PM. 90 tablet 3   carvedilol (COREG) 12.5 MG tablet Take 12.5 mg by mouth 2 (two) times daily with a meal.     dapagliflozin propanediol (FARXIGA) 10 MG TABS tablet Take 10 mg by mouth daily.     diphenhydrAMINE (BENADRYL) 25 MG tablet Take 25 mg by mouth every 8 (eight) hours as needed.     famotidine (PEPCID) 20 MG tablet Take 20 mg by mouth daily.     Ferrous Bisglycinate Chelate 28 MG CAPS Take 25 mg by mouth daily.     hydrALAZINE (APRESOLINE) 100 MG tablet Take 100 mg by mouth 3  (three) times daily.     losartan (COZAAR) 25 MG tablet Take 25 mg by mouth daily.     metFORMIN (GLUCOPHAGE) 1000 MG tablet Take 500 mg by mouth 2 (two) times daily with a meal.     Multiple Vitamins-Minerals (MULTIVITAMIN WITH MINERALS) tablet Take 1 tablet by mouth daily.       TRESIBA FLEXTOUCH 200 UNIT/ML SOPN Inject 100 Units into the skin daily.  3   TURMERIC PO Take 1 tablet by mouth daily.     CONTOUR NEXT TEST test strip      ferrous sulfate 325 (65 FE) MG EC tablet Take 325 mg by mouth daily with breakfast. (Patient not taking: Reported on 04/13/2021)     Current Facility-Administered Medications  Medication Dose Route Frequency Provider Last Rate Last Admin   EPINEPHrine (EPI-PEN) injection 0.3 mg  0.3 mg Intramuscular Once Particia Nearing S, PA-C        Allergies:   Ace inhibitors, Lisinopril, Beef-derived products, Other, Pegademase bovine, Ranexa [ranolazine], and Wellbutrin [bupropion hcl]    ROS:  Please see the history of present illness.   Otherwise, review of systems are positive for none.   All other systems are reviewed and negative.    PHYSICAL EXAM: VS:  BP 122/60   Pulse 61   Ht '5\' 4"'$  (1.626 m)   Wt 188 lb (85.3 kg)   BMI 32.27 kg/m  , BMI Body mass index is 32.27 kg/m. GENERAL:  Well appearing NECK:  No jugular venous distention, waveform within normal limits, carotid upstroke brisk and symmetric, no bruits, no thyromegaly LUNGS:  Clear to auscultation bilaterally CHEST:  Well healed sternotomy scar. HEART:  PMI not displaced or sustained,S1 and S2 within normal limits, no S3, no S4, no clicks, no rubs, very soft brief apical systolic murmur, no diastolic murmurs ABD:  Flat, positive bowel sounds normal in frequency in pitch, no bruits, no rebound, no guarding, no midline pulsatile mass, no hepatomegaly, no splenomegaly EXT:  2 plus pulses throughout, no edema, no cyanosis no clubbing   EKG:  EKG is  ordered today. The ekg ordered today demonstrates sinus  rhythm, rate 61, axis within normal limits, intervals within normal limits, no acute ST-T wave changes.   Recent Labs: No results found for requested labs within last 365 days.    Lipid Panel    Component Value Date/Time   CHOL 140 02/04/2010 0000   TRIG 313 (H) 02/04/2010 0000   HDL 30 (L) 02/04/2010 0000   CHOLHDL 4.7 Ratio 02/04/2010 0000   VLDL 63 (H) 02/04/2010 0000   LDLCALC 47 02/04/2010 0000  Wt Readings from Last 3 Encounters:  06/07/22 188 lb (85.3 kg)  04/13/21 190 lb 3.2 oz (86.3 kg)  01/28/20 185 lb (83.9 kg)      Other studies Reviewed: Additional studies/ records that were reviewed today include: Labs Review of the above records demonstrates:  Please see elsewhere in the note.     ASSESSMENT AND PLAN:    CAD The patient has no new sypmtoms.  No further cardiovascular testing is indicated.  We will continue with aggressive risk reduction and meds as listed.  HTN The blood pressure is at target.  His meds were just adjusted with an increase in his hydralazine.   Hyperlipidemia    LDL was 33 with an HDL of 38.  No change in therapy.   DM His A1c most recently was 6.4 which is down from 7.1.   He will continue as listed.   MURMUR:  There were no abnormalities on echo last summer.    CKD IIIB This has been stable and he has been followed by nephrology.   Current medicines are reviewed at length with the patient today.  The patient does not have concerns regarding medicines.  The following changes have been made:  None  Labs/ tests ordered today include: None  Orders Placed This Encounter  Procedures   EKG 12-Lead      Disposition:   FU with in in one year.    Signed, Minus Breeding, MD  06/07/2022 11:04 AM    [facility]

## 2022-06-07 ENCOUNTER — Encounter: Payer: Self-pay | Admitting: Cardiology

## 2022-06-07 ENCOUNTER — Ambulatory Visit: Payer: Medicare HMO | Admitting: Cardiology

## 2022-06-07 VITALS — BP 122/60 | HR 61 | Ht 64.0 in | Wt 188.0 lb

## 2022-06-07 DIAGNOSIS — I10 Essential (primary) hypertension: Secondary | ICD-10-CM

## 2022-06-07 DIAGNOSIS — E118 Type 2 diabetes mellitus with unspecified complications: Secondary | ICD-10-CM

## 2022-06-07 DIAGNOSIS — I2510 Atherosclerotic heart disease of native coronary artery without angina pectoris: Secondary | ICD-10-CM | POA: Diagnosis not present

## 2022-06-07 DIAGNOSIS — E785 Hyperlipidemia, unspecified: Secondary | ICD-10-CM

## 2022-06-07 NOTE — Patient Instructions (Signed)
Medication Instructions:  The current medical regimen is effective;  continue present plan and medications.  *If you need a refill on your cardiac medications before your next appointment, please call your pharmacy*  Follow-Up: At [facility], you and your health needs are our priority.  As part of our continuing mission to provide you with exceptional heart care, we have created designated Provider Care Teams.  These Care Teams include your primary Cardiologist (physician) and Advanced Practice Providers (APPs -  Physician Assistants and Nurse Practitioners) who all work together to provide you with the care you need, when you need it.  We recommend signing up for the patient portal called "MyChart".  Sign up information is provided on this After Visit Summary.  MyChart is used to connect with patients for Virtual Visits (Telemedicine).  Patients are able to view lab/test results, encounter notes, upcoming appointments, etc.  Non-urgent messages can be sent to your provider as well.   To learn more about what you can do with MyChart, go to [email or web address].    Your next appointment:   1 year(s)  The format for your next appointment:   In Person  Provider:   James Hochrein, MD     Important Information About Sugar       

## 2022-06-25 ENCOUNTER — Other Ambulatory Visit: Payer: Self-pay

## 2022-07-04 DIAGNOSIS — L600 Ingrowing nail: Secondary | ICD-10-CM | POA: Diagnosis not present

## 2022-07-13 DIAGNOSIS — N19 Unspecified kidney failure: Secondary | ICD-10-CM | POA: Diagnosis not present

## 2022-07-13 DIAGNOSIS — N189 Chronic kidney disease, unspecified: Secondary | ICD-10-CM | POA: Diagnosis not present

## 2022-07-13 DIAGNOSIS — I5032 Chronic diastolic (congestive) heart failure: Secondary | ICD-10-CM | POA: Diagnosis not present

## 2022-07-13 DIAGNOSIS — E1122 Type 2 diabetes mellitus with diabetic chronic kidney disease: Secondary | ICD-10-CM | POA: Diagnosis not present

## 2022-07-13 DIAGNOSIS — D631 Anemia in chronic kidney disease: Secondary | ICD-10-CM | POA: Diagnosis not present

## 2022-07-13 DIAGNOSIS — I129 Hypertensive chronic kidney disease with stage 1 through stage 4 chronic kidney disease, or unspecified chronic kidney disease: Secondary | ICD-10-CM | POA: Diagnosis not present

## 2022-07-13 DIAGNOSIS — R808 Other proteinuria: Secondary | ICD-10-CM | POA: Diagnosis not present

## 2022-07-20 DIAGNOSIS — R808 Other proteinuria: Secondary | ICD-10-CM | POA: Diagnosis not present

## 2022-07-20 DIAGNOSIS — N19 Unspecified kidney failure: Secondary | ICD-10-CM | POA: Diagnosis not present

## 2022-07-20 DIAGNOSIS — D631 Anemia in chronic kidney disease: Secondary | ICD-10-CM | POA: Diagnosis not present

## 2022-07-20 DIAGNOSIS — I129 Hypertensive chronic kidney disease with stage 1 through stage 4 chronic kidney disease, or unspecified chronic kidney disease: Secondary | ICD-10-CM | POA: Diagnosis not present

## 2022-07-20 DIAGNOSIS — N189 Chronic kidney disease, unspecified: Secondary | ICD-10-CM | POA: Diagnosis not present

## 2022-07-20 DIAGNOSIS — E1122 Type 2 diabetes mellitus with diabetic chronic kidney disease: Secondary | ICD-10-CM | POA: Diagnosis not present

## 2022-07-20 DIAGNOSIS — I5032 Chronic diastolic (congestive) heart failure: Secondary | ICD-10-CM | POA: Diagnosis not present

## 2022-08-24 DIAGNOSIS — H18513 Endothelial corneal dystrophy, bilateral: Secondary | ICD-10-CM | POA: Diagnosis not present

## 2022-08-24 DIAGNOSIS — D3131 Benign neoplasm of right choroid: Secondary | ICD-10-CM | POA: Diagnosis not present

## 2022-08-24 DIAGNOSIS — H52223 Regular astigmatism, bilateral: Secondary | ICD-10-CM | POA: Diagnosis not present

## 2022-08-24 DIAGNOSIS — H5203 Hypermetropia, bilateral: Secondary | ICD-10-CM | POA: Diagnosis not present

## 2022-08-24 DIAGNOSIS — Z961 Presence of intraocular lens: Secondary | ICD-10-CM | POA: Diagnosis not present

## 2022-08-24 DIAGNOSIS — H524 Presbyopia: Secondary | ICD-10-CM | POA: Diagnosis not present

## 2022-08-24 DIAGNOSIS — E119 Type 2 diabetes mellitus without complications: Secondary | ICD-10-CM | POA: Diagnosis not present

## 2022-09-19 DIAGNOSIS — L570 Actinic keratosis: Secondary | ICD-10-CM | POA: Diagnosis not present

## 2022-09-19 DIAGNOSIS — Z85828 Personal history of other malignant neoplasm of skin: Secondary | ICD-10-CM | POA: Diagnosis not present

## 2022-09-19 DIAGNOSIS — D239 Other benign neoplasm of skin, unspecified: Secondary | ICD-10-CM | POA: Diagnosis not present

## 2022-09-19 DIAGNOSIS — Z1283 Encounter for screening for malignant neoplasm of skin: Secondary | ICD-10-CM | POA: Diagnosis not present

## 2022-09-24 DIAGNOSIS — Z0100 Encounter for examination of eyes and vision without abnormal findings: Secondary | ICD-10-CM | POA: Diagnosis not present

## 2022-09-25 DIAGNOSIS — E782 Mixed hyperlipidemia: Secondary | ICD-10-CM | POA: Diagnosis not present

## 2022-09-25 DIAGNOSIS — E1142 Type 2 diabetes mellitus with diabetic polyneuropathy: Secondary | ICD-10-CM | POA: Diagnosis not present

## 2022-09-29 DIAGNOSIS — E669 Obesity, unspecified: Secondary | ICD-10-CM | POA: Insufficient documentation

## 2022-09-29 DIAGNOSIS — N189 Chronic kidney disease, unspecified: Secondary | ICD-10-CM | POA: Diagnosis not present

## 2022-09-29 DIAGNOSIS — R808 Other proteinuria: Secondary | ICD-10-CM | POA: Diagnosis not present

## 2022-09-29 DIAGNOSIS — I129 Hypertensive chronic kidney disease with stage 1 through stage 4 chronic kidney disease, or unspecified chronic kidney disease: Secondary | ICD-10-CM | POA: Diagnosis not present

## 2022-09-29 DIAGNOSIS — N19 Unspecified kidney failure: Secondary | ICD-10-CM | POA: Diagnosis not present

## 2022-09-29 DIAGNOSIS — D509 Iron deficiency anemia, unspecified: Secondary | ICD-10-CM | POA: Diagnosis not present

## 2022-09-29 DIAGNOSIS — E782 Mixed hyperlipidemia: Secondary | ICD-10-CM | POA: Diagnosis not present

## 2022-09-29 DIAGNOSIS — I10 Essential (primary) hypertension: Secondary | ICD-10-CM | POA: Diagnosis not present

## 2022-09-29 DIAGNOSIS — D631 Anemia in chronic kidney disease: Secondary | ICD-10-CM | POA: Diagnosis not present

## 2022-09-29 DIAGNOSIS — K7581 Nonalcoholic steatohepatitis (NASH): Secondary | ICD-10-CM | POA: Diagnosis not present

## 2022-09-29 DIAGNOSIS — I5032 Chronic diastolic (congestive) heart failure: Secondary | ICD-10-CM | POA: Diagnosis not present

## 2022-09-29 DIAGNOSIS — I130 Hypertensive heart and chronic kidney disease with heart failure and stage 1 through stage 4 chronic kidney disease, or unspecified chronic kidney disease: Secondary | ICD-10-CM | POA: Diagnosis not present

## 2022-09-29 DIAGNOSIS — G4700 Insomnia, unspecified: Secondary | ICD-10-CM | POA: Insufficient documentation

## 2022-09-29 DIAGNOSIS — N1832 Chronic kidney disease, stage 3b: Secondary | ICD-10-CM | POA: Diagnosis not present

## 2022-09-29 DIAGNOSIS — E1122 Type 2 diabetes mellitus with diabetic chronic kidney disease: Secondary | ICD-10-CM | POA: Diagnosis not present

## 2022-09-29 DIAGNOSIS — E1142 Type 2 diabetes mellitus with diabetic polyneuropathy: Secondary | ICD-10-CM | POA: Diagnosis not present

## 2022-10-06 DIAGNOSIS — R808 Other proteinuria: Secondary | ICD-10-CM | POA: Diagnosis not present

## 2022-10-06 DIAGNOSIS — E1122 Type 2 diabetes mellitus with diabetic chronic kidney disease: Secondary | ICD-10-CM | POA: Diagnosis not present

## 2022-10-06 DIAGNOSIS — N189 Chronic kidney disease, unspecified: Secondary | ICD-10-CM | POA: Diagnosis not present

## 2022-10-06 DIAGNOSIS — I5032 Chronic diastolic (congestive) heart failure: Secondary | ICD-10-CM | POA: Diagnosis not present

## 2022-10-06 DIAGNOSIS — I129 Hypertensive chronic kidney disease with stage 1 through stage 4 chronic kidney disease, or unspecified chronic kidney disease: Secondary | ICD-10-CM | POA: Diagnosis not present

## 2022-11-03 DIAGNOSIS — G4730 Sleep apnea, unspecified: Secondary | ICD-10-CM | POA: Diagnosis not present

## 2022-11-07 ENCOUNTER — Encounter: Payer: Self-pay | Admitting: *Deleted

## 2022-12-25 DIAGNOSIS — M545 Low back pain, unspecified: Secondary | ICD-10-CM | POA: Diagnosis not present

## 2023-01-04 DIAGNOSIS — N189 Chronic kidney disease, unspecified: Secondary | ICD-10-CM | POA: Diagnosis not present

## 2023-01-04 DIAGNOSIS — I129 Hypertensive chronic kidney disease with stage 1 through stage 4 chronic kidney disease, or unspecified chronic kidney disease: Secondary | ICD-10-CM | POA: Diagnosis not present

## 2023-01-04 DIAGNOSIS — E1122 Type 2 diabetes mellitus with diabetic chronic kidney disease: Secondary | ICD-10-CM | POA: Diagnosis not present

## 2023-01-04 DIAGNOSIS — R808 Other proteinuria: Secondary | ICD-10-CM | POA: Diagnosis not present

## 2023-01-04 DIAGNOSIS — I5032 Chronic diastolic (congestive) heart failure: Secondary | ICD-10-CM | POA: Diagnosis not present

## 2023-01-11 DIAGNOSIS — E1122 Type 2 diabetes mellitus with diabetic chronic kidney disease: Secondary | ICD-10-CM | POA: Diagnosis not present

## 2023-01-11 DIAGNOSIS — R808 Other proteinuria: Secondary | ICD-10-CM | POA: Diagnosis not present

## 2023-01-11 DIAGNOSIS — I5032 Chronic diastolic (congestive) heart failure: Secondary | ICD-10-CM | POA: Diagnosis not present

## 2023-01-11 DIAGNOSIS — N19 Unspecified kidney failure: Secondary | ICD-10-CM | POA: Diagnosis not present

## 2023-01-11 DIAGNOSIS — N189 Chronic kidney disease, unspecified: Secondary | ICD-10-CM | POA: Diagnosis not present

## 2023-01-11 DIAGNOSIS — I129 Hypertensive chronic kidney disease with stage 1 through stage 4 chronic kidney disease, or unspecified chronic kidney disease: Secondary | ICD-10-CM | POA: Diagnosis not present

## 2023-01-11 DIAGNOSIS — D631 Anemia in chronic kidney disease: Secondary | ICD-10-CM | POA: Diagnosis not present

## 2023-01-18 DIAGNOSIS — Z0001 Encounter for general adult medical examination with abnormal findings: Secondary | ICD-10-CM | POA: Diagnosis not present

## 2023-01-18 DIAGNOSIS — Z0000 Encounter for general adult medical examination without abnormal findings: Secondary | ICD-10-CM | POA: Diagnosis not present

## 2023-01-23 DIAGNOSIS — E782 Mixed hyperlipidemia: Secondary | ICD-10-CM | POA: Diagnosis not present

## 2023-01-23 DIAGNOSIS — E1142 Type 2 diabetes mellitus with diabetic polyneuropathy: Secondary | ICD-10-CM | POA: Diagnosis not present

## 2023-01-25 LAB — LAB REPORT - SCANNED
A1c: 6.2
Albumin, Urine POC: 107.3
Creatinine, POC: 65.6 mg/dL
EGFR: 33
Microalb Creat Ratio: 164

## 2023-01-26 DIAGNOSIS — G4733 Obstructive sleep apnea (adult) (pediatric): Secondary | ICD-10-CM | POA: Diagnosis not present

## 2023-01-28 DIAGNOSIS — G4733 Obstructive sleep apnea (adult) (pediatric): Secondary | ICD-10-CM | POA: Insufficient documentation

## 2023-01-28 DIAGNOSIS — D6949 Other primary thrombocytopenia: Secondary | ICD-10-CM | POA: Insufficient documentation

## 2023-01-29 DIAGNOSIS — K7581 Nonalcoholic steatohepatitis (NASH): Secondary | ICD-10-CM | POA: Diagnosis not present

## 2023-01-29 DIAGNOSIS — I130 Hypertensive heart and chronic kidney disease with heart failure and stage 1 through stage 4 chronic kidney disease, or unspecified chronic kidney disease: Secondary | ICD-10-CM | POA: Diagnosis not present

## 2023-01-29 DIAGNOSIS — I10 Essential (primary) hypertension: Secondary | ICD-10-CM | POA: Diagnosis not present

## 2023-01-29 DIAGNOSIS — R809 Proteinuria, unspecified: Secondary | ICD-10-CM | POA: Diagnosis not present

## 2023-01-29 DIAGNOSIS — D509 Iron deficiency anemia, unspecified: Secondary | ICD-10-CM | POA: Diagnosis not present

## 2023-01-29 DIAGNOSIS — G4733 Obstructive sleep apnea (adult) (pediatric): Secondary | ICD-10-CM | POA: Diagnosis not present

## 2023-01-29 DIAGNOSIS — E1142 Type 2 diabetes mellitus with diabetic polyneuropathy: Secondary | ICD-10-CM | POA: Diagnosis not present

## 2023-01-29 DIAGNOSIS — I2510 Atherosclerotic heart disease of native coronary artery without angina pectoris: Secondary | ICD-10-CM | POA: Diagnosis not present

## 2023-01-29 DIAGNOSIS — I5032 Chronic diastolic (congestive) heart failure: Secondary | ICD-10-CM | POA: Diagnosis not present

## 2023-01-29 DIAGNOSIS — E782 Mixed hyperlipidemia: Secondary | ICD-10-CM | POA: Diagnosis not present

## 2023-01-29 DIAGNOSIS — F419 Anxiety disorder, unspecified: Secondary | ICD-10-CM | POA: Diagnosis not present

## 2023-01-29 DIAGNOSIS — N1832 Chronic kidney disease, stage 3b: Secondary | ICD-10-CM | POA: Diagnosis not present

## 2023-02-06 ENCOUNTER — Encounter: Payer: Self-pay | Admitting: *Deleted

## 2023-02-12 ENCOUNTER — Encounter: Payer: Self-pay | Admitting: Gastroenterology

## 2023-02-15 ENCOUNTER — Encounter: Payer: Self-pay | Admitting: Internal Medicine

## 2023-02-22 DIAGNOSIS — H35373 Puckering of macula, bilateral: Secondary | ICD-10-CM | POA: Diagnosis not present

## 2023-02-22 DIAGNOSIS — D3131 Benign neoplasm of right choroid: Secondary | ICD-10-CM | POA: Diagnosis not present

## 2023-02-22 DIAGNOSIS — E113293 Type 2 diabetes mellitus with mild nonproliferative diabetic retinopathy without macular edema, bilateral: Secondary | ICD-10-CM | POA: Diagnosis not present

## 2023-02-22 DIAGNOSIS — H353131 Nonexudative age-related macular degeneration, bilateral, early dry stage: Secondary | ICD-10-CM | POA: Diagnosis not present

## 2023-02-22 DIAGNOSIS — H43813 Vitreous degeneration, bilateral: Secondary | ICD-10-CM | POA: Diagnosis not present

## 2023-02-25 DIAGNOSIS — G4733 Obstructive sleep apnea (adult) (pediatric): Secondary | ICD-10-CM | POA: Diagnosis not present

## 2023-03-07 ENCOUNTER — Encounter: Payer: Self-pay | Admitting: Gastroenterology

## 2023-03-12 DIAGNOSIS — G4733 Obstructive sleep apnea (adult) (pediatric): Secondary | ICD-10-CM | POA: Diagnosis not present

## 2023-03-12 DIAGNOSIS — Z9989 Dependence on other enabling machines and devices: Secondary | ICD-10-CM | POA: Diagnosis not present

## 2023-03-27 ENCOUNTER — Encounter: Payer: Self-pay | Admitting: Gastroenterology

## 2023-03-27 ENCOUNTER — Ambulatory Visit (AMBULATORY_SURGERY_CENTER): Payer: PPO

## 2023-03-27 VITALS — Ht 64.0 in | Wt 194.6 lb

## 2023-03-27 DIAGNOSIS — Z86010 Personal history of colonic polyps: Secondary | ICD-10-CM

## 2023-03-27 NOTE — Progress Notes (Signed)
No egg or soy allergy known to patient  No issues known to pt with past sedation with any surgeries or procedures Patient denies ever being told they had issues or difficulty with intubation  No FH of Malignant Hyperthermia Pt is not on diet pills Pt is not on  home 02  Pt is not on blood thinners  Pt denies issues with constipation  No A fib or A flutter Have any cardiac testing pending--no  LOA: independent   Patient's chart reviewed by Cathlyn Parsons CNRA prior to previsit and patient appropriate for the [facility].  Previsit completed and red dot placed by patient's name on their procedure day (on provider's schedule).      PV completed with patient. Miralax prep given due to allergy contraindications with bowel prep (Golytely) covered by the patients insurance. Pt to purchase all prep products OTC. Prep instructions reviewed with patient hard copy given at visit.

## 2023-03-28 DIAGNOSIS — G4733 Obstructive sleep apnea (adult) (pediatric): Secondary | ICD-10-CM | POA: Diagnosis not present

## 2023-04-13 ENCOUNTER — Encounter: Payer: Self-pay | Admitting: Gastroenterology

## 2023-04-13 ENCOUNTER — Ambulatory Visit (AMBULATORY_SURGERY_CENTER): Payer: PPO | Admitting: Gastroenterology

## 2023-04-13 VITALS — BP 114/62 | HR 58 | Temp 98.9°F | Resp 14 | Ht 64.0 in | Wt 194.6 lb

## 2023-04-13 DIAGNOSIS — D122 Benign neoplasm of ascending colon: Secondary | ICD-10-CM

## 2023-04-13 DIAGNOSIS — D123 Benign neoplasm of transverse colon: Secondary | ICD-10-CM

## 2023-04-13 DIAGNOSIS — Z86010 Personal history of colonic polyps: Secondary | ICD-10-CM | POA: Diagnosis not present

## 2023-04-13 DIAGNOSIS — F419 Anxiety disorder, unspecified: Secondary | ICD-10-CM | POA: Diagnosis not present

## 2023-04-13 DIAGNOSIS — Z09 Encounter for follow-up examination after completed treatment for conditions other than malignant neoplasm: Secondary | ICD-10-CM | POA: Diagnosis not present

## 2023-04-13 DIAGNOSIS — G4730 Sleep apnea, unspecified: Secondary | ICD-10-CM | POA: Diagnosis not present

## 2023-04-13 DIAGNOSIS — D125 Benign neoplasm of sigmoid colon: Secondary | ICD-10-CM

## 2023-04-13 DIAGNOSIS — K635 Polyp of colon: Secondary | ICD-10-CM | POA: Diagnosis not present

## 2023-04-13 DIAGNOSIS — I252 Old myocardial infarction: Secondary | ICD-10-CM | POA: Diagnosis not present

## 2023-04-13 DIAGNOSIS — J449 Chronic obstructive pulmonary disease, unspecified: Secondary | ICD-10-CM | POA: Diagnosis not present

## 2023-04-13 DIAGNOSIS — I10 Essential (primary) hypertension: Secondary | ICD-10-CM | POA: Diagnosis not present

## 2023-04-13 MED ORDER — SODIUM CHLORIDE 0.9 % IV SOLN: 500.0000 mL | INTRAVENOUS | Status: AC

## 2023-04-13 NOTE — Patient Instructions (Signed)
Impression/Recommendations:  Polyp, diverticulosis, and hemorrhoid handouts given to patient.  Resume previous diet. Continue present medications. No aspirin, ibuprofen, naproxen, or other NSAID drugs for 5 days.  Await pathology results.  Repeat colonoscopy in 6 months for surveillance.  YOU HAD AN ENDOSCOPIC PROCEDURE TODAY AT THE [facility]:   Refer to the procedure report that was given to you for any specific questions about what was found during the examination.  If the procedure report does not answer your questions, please call your gastroenterologist to clarify.  If you requested that your care partner not be given the details of your procedure findings, then the procedure report has been included in a sealed envelope for you to review at your convenience later.  YOU SHOULD EXPECT: Some feelings of bloating in the abdomen. Passage of more gas than usual.  Walking can help get rid of the air that was put into your GI tract during the procedure and reduce the bloating. If you had a lower endoscopy (such as a colonoscopy or flexible sigmoidoscopy) you may notice spotting of blood in your stool or on the toilet paper. If you underwent a bowel prep for your procedure, you may not have a normal bowel movement for a few days.  Please Note:  You might notice some irritation and congestion in your nose or some drainage.  This is from the oxygen used during your procedure.  There is no need for concern and it should clear up in a day or so.  SYMPTOMS TO REPORT IMMEDIATELY:  Following lower endoscopy (colonoscopy or flexible sigmoidoscopy):  Excessive amounts of blood in the stool  Significant tenderness or worsening of abdominal pains  Swelling of the abdomen that is new, acute  Fever of 100F or higher  For urgent or emergent issues, a gastroenterologist can be reached at any hour by calling [phone]. Do not use MyChart messaging for urgent concerns.    DIET:  We do  recommend a small meal at first, but then you may proceed to your regular diet.  Drink plenty of fluids but you should avoid alcoholic beverages for 24 hours.  ACTIVITY:  You should plan to take it easy for the rest of today and you should NOT DRIVE or use heavy machinery until tomorrow (because of the sedation medicines used during the test).    FOLLOW UP: Our staff will call the number listed on your records the next business day following your procedure.  We will call around 7:15- 8:00 am to check on you and address any questions or concerns that you may have regarding the information given to you following your procedure. If we do not reach you, we will leave a message.     If any biopsies were taken you will be contacted by phone or by letter within the next 1-3 weeks.  Please call us at [phone] if you have not heard about the biopsies in 3 weeks.    SIGNATURES/CONFIDENTIALITY: You and/or your care partner have signed paperwork which will be entered into your electronic medical record.  These signatures attest to the fact that that the information above on your After Visit Summary has been reviewed and is understood.  Full responsibility of the confidentiality of this discharge information lies with you and/or your care-partner.

## 2023-04-13 NOTE — Op Note (Signed)
[facility] Patient Name: [name] Procedure Date: 04/13/2023 8:38 AM MRN: [id number] Endoscopist: Sherilyn Cooter L. Myrtie Neither , MD, [id number] Age: 67 Referring MD:  Date of Birth: 1956-05-23 Gender: Male Account #: [id number] Procedure:                Colonoscopy Indications:              Surveillance: Personal history of polyps on last                            colonoscopy > 5 years ago                           Diminutive tubular adenoma July 2017                           Subcentimeter SSP May 2012 Medicines:                Monitored Anesthesia Care Procedure:                Pre-Anesthesia Assessment:                           - Prior to the procedure, a History and Physical                            was performed, and patient medications and                            allergies were reviewed. The patient's tolerance of                            previous anesthesia was also reviewed. The risks                            and benefits of the procedure and the sedation                            options and risks were discussed with the patient.                            All questions were answered, and informed consent                            was obtained. Prior Anticoagulants: The patient has                            taken no anticoagulant or antiplatelet agents. ASA                            Grade Assessment: III - A patient with severe                            systemic disease. After reviewing the risks and  benefits, the patient was deemed in satisfactory                            condition to undergo the procedure.                           After obtaining informed consent, the colonoscope                            was passed under direct vision. Throughout the                            procedure, the patient's blood pressure, pulse, and                            oxygen saturations were monitored continuously. The                             CF [id number] #[id number] was introduced through the anus                            and advanced to the the cecum, identified by                            appendiceal orifice and ileocecal valve. The                            colonoscopy was somewhat difficult due to a                            redundant colon. Successful completion of the                            procedure was aided by using manual pressure and                            straightening and shortening the scope to obtain                            bowel loop reduction. The patient tolerated the                            procedure well. The quality of the bowel                            preparation was good after lavage of opaque liquid                            throughout colon. The ileocecal valve, appendiceal                            orifice, and rectum were photographed. The bowel  preparation used was GoLYTELY via split dose                            instruction. Scope In: 8:45:53 AM Scope Out: 9:22:43 AM Scope Withdrawal Time: 0 hours 29 minutes 20 seconds  Total Procedure Duration: 0 hours 36 minutes 50 seconds  Findings:                 The perianal and digital rectal examinations were                            normal.                           Repeat examination of right colon under NBI                            performed.                           The sigmoid colon was redundant.                           Two sessile polyps were found in the proximal                            ascending colon and distal ascending colon. The                            polyps were 2 to 8 mm in size. These polyps were                            removed with a cold snare. Resection and retrieval                            were complete.                           A 30 mm polyp was found in the proximal transverse                            colon. The polyp was sessile and semi-sessile. The                             polyp was removed with a piecemeal technique using                            a cold snare. Resection and retrieval were                            complete. To prevent bleeding post-intervention,                            two hemostatic clips were successfully placed (MR  conditional). Area was tattooed with an injection                            of 0.5 mL of Spot (carbon black).                           An 8 mm polyp was found in the sigmoid colon. The                            polyp was pedunculated. The polyp was removed with                            a hot snare. Resection and retrieval were complete.                           Diverticula were found in the left colon and right                            colon.                           Internal hemorrhoids were found.                           The exam was otherwise without abnormality on                            direct and retroflexion views. Complications:            No immediate complications. Estimated Blood Loss:     Estimated blood loss was minimal. Impression:               - Redundant colon.                           - Two 2 to 8 mm polyps in the proximal ascending                            colon and in the distal ascending colon, removed                            with a cold snare. Resected and retrieved.                           - One 30 mm polyp in the proximal transverse colon,                            removed piecemeal using a cold snare. Resected and                            retrieved. Clips (MR conditional) were placed.                            Tattooed.                           -  One 8 mm polyp in the sigmoid colon, removed with                            a hot snare. Resected and retrieved.                           - Diverticulosis in the left colon and in the right                            colon.                           - Internal hemorrhoids.                            - The examination was otherwise normal on direct                            and retroflexion views. Recommendation:           - Patient has a contact number available for                            emergencies. The signs and symptoms of potential                            delayed complications were discussed with the                            patient. Return to normal activities tomorrow.                            Written discharge instructions were provided to the                            patient.                           - Resume previous diet.                           - Continue present medications.                           - No aspirin, ibuprofen, naproxen, or other                            non-steroidal anti-inflammatory drugs for 5 days                            after polyp removal.                           - Await pathology results.                           - Repeat colonoscopy in 6 months  for surveillance                            (large right colon polyp removed piecemeal) -                            Golytely prep for all future colonoscopies. Taijah Macrae L. Myrtie Neither, MD 04/13/2023 9:36:36 AM This report has been signed electronically.

## 2023-04-13 NOTE — Progress Notes (Signed)
Pt's states no medical or surgical changes since previsit or office visit. 

## 2023-04-13 NOTE — Progress Notes (Signed)
Sedate, gd SR, tolerated procedure well, VSS, report to RN 

## 2023-04-13 NOTE — Progress Notes (Signed)
History and Physical:  This patient presents for endoscopic testing for: Encounter Diagnosis  Name Primary?   Personal history of colonic polyps Yes   Surveillance colonoscopy today Diminutive TA July 2017, <36mm SSP May 2012 Patient denies chronic abdominal pain, rectal bleeding, constipation or diarrhea.  Patient is otherwise without complaints or active issues today.   Past Medical History: Past Medical History:  Diagnosis Date   Abnormal LFTs    Adenomatous colon polyp 2012   Allergy    to Red Meat   Anemia    Anxiety    Arteriosclerotic cardiovascular disease (ASCVD) 2003   Non-Q MI with stent to RCA in 12/2001, normal EF, additional stents to mid CX and D1; cath in 02/2005-progressive or eye disease, 50-70% mid LAD, 90% distal LAD, 50% D1, 60-70% CX, small PDA with diffuse disease, patent RCA stent; repeat cath in 01/2008-no significant progression; non-Q MI in 02/2010 with 100% mid Cx at  prior stent, nondom. RCA with patent stent, 50% RI, nl EF, CABG-04/2010   Cataract    bilateral cataracts removed   Chronic kidney disease    Creatinine of 1.44 in 04/2009   Chronic obstructive pulmonary disease (HCC)    Coronary artery disease    stent 2011   Diabetes mellitus    Erectile dysfunction    Gastroesophageal reflux disease    Hepatic steatosis    Hyperlipidemia    Predominantly elevated triglycerides   Hypertension    Myocardial infarction (HCC) 2002, 2011   Neuropathy    feet   Sleep apnea    Tobacco abuse, in remission    35 pack years; discontinued 05/2010     Past Surgical History: Past Surgical History:  Procedure Laterality Date   APPENDECTOMY  1980   CATARACT EXTRACTION W/PHACO Left 09/21/2014   Procedure: CATARACT EXTRACTION PHACO AND INTRAOCULAR LENS PLACEMENT (IOC);  Surgeon: Gemma Payor, MD;  Location: [facility];  Service: Ophthalmology;  Laterality: Left;  CDE:9.77   COLONOSCOPY     COLONOSCOPY W/ POLYPECTOMY  2012   Dr. Juanda Chance; 2 polyps removed one of  which was precancerous   CORONARY ARTERY BYPASS GRAFT  05/2010   4 vessels August 2011   EYE SURGERY     HEMORRHOID SURGERY N/A 02/24/2014   Procedure: HEMORRHOIDECTOMY;  Surgeon: Clovis Pu. Cornett, MD;  Location: [facility];  Service: General;  Laterality: N/A;   POLYPECTOMY     SHOULDER ARTHROSCOPY WITH OPEN ROTATOR CUFF REPAIR AND DISTAL CLAVICLE ACROMINECTOMY Left 11/23/2015   Procedure: SHOULDER ARTHROSCOPY WITH MINI-OPEN ROTATOR CUFF REPAIR, DISTAL CLAVICLE RESECTION AND SUBACROMIAL DECOMPRESSION.;  Surgeon: Valeria Batman, MD;  Location: [facility];  Service: Orthopedics;  Laterality: Left;  LEFT SHOULDER ARTHROSCOPIC SUBACROMIAL DECOMPRESSION, DISTAL CLAVICLE RESECTION, MINI-OPEN ROTATOR CUFF REPAIR.   UPPER GASTROINTESTINAL ENDOSCOPY      Allergies: Allergies  Allergen Reactions   Ace Inhibitors Anaphylaxis   Lisinopril     Angio -edema    Beef-Derived Products Hives    RED MEAT   Other Hives    Red meat   Pegademase Bovine Hives    RED MEAT   Ranexa [Ranolazine] Rash   Hydrochlorothiazide Other (See Comments)    Caused tongue to swell   Wellbutrin [Bupropion Hcl] Rash    Outpatient Meds: Current Outpatient Medications  Medication Sig Dispense Refill   ALPRAZolam (XANAX) 0.5 MG tablet Take 0.5 mg by mouth 2 (two) times daily as needed.     amLODipine (NORVASC) 10 MG tablet Take 1 tablet (10  mg total) daily by mouth. 90 tablet 3   aspirin EC 81 MG tablet Take 81 mg by mouth daily.     atorvastatin (LIPITOR) 40 MG tablet TAKE 1 TABLET BY MOUTH DAILY AT 6 PM. 90 tablet 3   carvedilol (COREG) 12.5 MG tablet Take 12.5 mg by mouth 2 (two) times daily with a meal.     CONTOUR NEXT TEST test strip      dapagliflozin propanediol (FARXIGA) 10 MG TABS tablet Take 10 mg by mouth daily.     famotidine (PEPCID) 20 MG tablet Take 20 mg by mouth daily.     Finerenone (KERENDIA) 10 MG TABS Take 10 mg by mouth daily.     hydrALAZINE (APRESOLINE) 100 MG tablet Take 100 mg by  mouth 3 (three) times daily.     insulin aspart (NOVOLOG FLEXPEN) 100 UNIT/ML FlexPen Inject 10-20 Units into the skin 2 (two) times daily.     losartan (COZAAR) 100 MG tablet Take 1 tablet by mouth daily.     Multiple Vitamins-Minerals (MULTIVITAMIN WITH MINERALS) tablet Take 1 tablet by mouth daily.       TRESIBA FLEXTOUCH 200 UNIT/ML SOPN Inject 50 Units into the skin daily.  3   Current Facility-Administered Medications  Medication Dose Route Frequency Provider Last Rate Last Admin   0.9 %  sodium chloride infusion  500 mL Intravenous Continuous Danis, Starr Lake III, MD       EPINEPHrine (EPI-PEN) injection 0.3 mg  0.3 mg Intramuscular Once Remus Loffler, PA-C          ___________________________________________________________________ Objective   Exam:  BP (!) 147/62   Pulse 68   Temp 98.9 F (37.2 C) (Temporal)   Ht 5\' 4"  (1.626 m)   Wt 194 lb 9.6 oz (88.3 kg)   SpO2 96%   BMI 33.40 kg/m   CV: regular , S1/S2 Resp: clear to auscultation bilaterally, normal RR and effort noted GI: soft, no tenderness, with active bowel sounds.   Assessment: Encounter Diagnosis  Name Primary?   Personal history of colonic polyps Yes     Plan: Colonoscopy   The benefits and risks of the planned procedure were described in detail with the patient or (when appropriate) their health care proxy.  Risks were outlined as including, but not limited to, bleeding, infection, perforation, adverse medication reaction leading to cardiac or pulmonary decompensation, pancreatitis (if ERCP).  The limitation of incomplete mucosal visualization was also discussed.  No guarantees or warranties were given.  The patient is appropriate for an endoscopic procedure in the ambulatory setting.   - Amada Jupiter, MD

## 2023-04-13 NOTE — Progress Notes (Signed)
Called to room to assist during endoscopic procedure.  Patient ID and intended procedure confirmed with present staff. Received instructions for my participation in the procedure from the performing physician.  

## 2023-04-16 ENCOUNTER — Telehealth: Payer: Self-pay | Admitting: *Deleted

## 2023-04-16 NOTE — Telephone Encounter (Signed)
Attempted f/u phone call. No answer. Left message. °

## 2023-04-18 ENCOUNTER — Encounter: Payer: Self-pay | Admitting: Gastroenterology

## 2023-04-25 DIAGNOSIS — I129 Hypertensive chronic kidney disease with stage 1 through stage 4 chronic kidney disease, or unspecified chronic kidney disease: Secondary | ICD-10-CM | POA: Diagnosis not present

## 2023-04-25 DIAGNOSIS — N189 Chronic kidney disease, unspecified: Secondary | ICD-10-CM | POA: Diagnosis not present

## 2023-04-25 DIAGNOSIS — D631 Anemia in chronic kidney disease: Secondary | ICD-10-CM | POA: Diagnosis not present

## 2023-04-25 DIAGNOSIS — R809 Proteinuria, unspecified: Secondary | ICD-10-CM | POA: Diagnosis not present

## 2023-04-27 DIAGNOSIS — G4733 Obstructive sleep apnea (adult) (pediatric): Secondary | ICD-10-CM | POA: Diagnosis not present

## 2023-05-02 DIAGNOSIS — I129 Hypertensive chronic kidney disease with stage 1 through stage 4 chronic kidney disease, or unspecified chronic kidney disease: Secondary | ICD-10-CM | POA: Diagnosis not present

## 2023-05-02 DIAGNOSIS — D638 Anemia in other chronic diseases classified elsewhere: Secondary | ICD-10-CM | POA: Diagnosis not present

## 2023-05-02 DIAGNOSIS — E1122 Type 2 diabetes mellitus with diabetic chronic kidney disease: Secondary | ICD-10-CM | POA: Diagnosis not present

## 2023-05-02 DIAGNOSIS — R808 Other proteinuria: Secondary | ICD-10-CM | POA: Diagnosis not present

## 2023-05-16 NOTE — Progress Notes (Signed)
05/18/23- 66yoM former smoker (20 pkyrs) for sleep evaluation courtesy of Dr Dwana Melena with concern of OSA Medical problem list  includes ASCVD, dCCHF, HTN, Allergic Rhinitis, GERD, DM2/ neuropathy,  CKD3b, Abnormal LFTs, Obesity, FEAnemia, Anxiety,  Says he is unaware of any lung disease. No ENT surgery. 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  HSTsleep study-SNAP- 11/03/22-AHI 4% 6.1/hr, AHI 3% 36.3/hr, desat to 83%, body weight 190  lbs CPAP  auto 7-14/ [facility]     AirSense 10 AutoSet Download compliance 97%, AHI 3.2/hr Body weight today- Epworth score- Sleep study was prompted by loud snoring. Slept better before CPAP, saying that mask leaks and adjustments during night are disruptive. Now tired around lunch time. Download reviewed. Significant leak noted. He feels pressure too high.  Prior to Admission medications   Medication Sig Start Date End Date Taking? Authorizing Provider  ALPRAZolam Prudy Feeler) 0.5 MG tablet Take 0.5 mg by mouth 2 (two) times daily as needed. 02/03/19  Yes [provider]  amLODipine (NORVASC) 10 MG tablet Take 1 tablet (10 mg total) daily by mouth. 08/14/17  Yes BranchDorothe Pea, MD  aspirin EC 81 MG tablet Take 81 mg by mouth daily.   Yes [provider]  atorvastatin (LIPITOR) 40 MG tablet TAKE 1 TABLET BY MOUTH DAILY AT 6 PM. 08/14/17  Yes Branch, Dorothe Pea, MD  carvedilol (COREG) 12.5 MG tablet Take 12.5 mg by mouth 2 (two) times daily with a meal.   Yes [provider]  dapagliflozin propanediol (FARXIGA) 10 MG TABS tablet Take 10 mg by mouth daily.   Yes [provider]  famotidine (PEPCID) 20 MG tablet Take 20 mg by mouth daily.   Yes [provider]  Finerenone (KERENDIA) 10 MG TABS Take 10 mg by mouth daily.   Yes [provider]  hydrALAZINE (APRESOLINE) 100 MG tablet Take 100 mg by mouth 3 (three) times daily.   Yes [provider]  insulin aspart (NOVOLOG FLEXPEN) 100 UNIT/ML FlexPen  Inject 10-20 Units into the skin 2 (two) times daily.   Yes [provider]  losartan (COZAAR) 100 MG tablet Take 100 mg by mouth daily. 06/22/22  Yes [provider]  Multiple Vitamins-Minerals (MULTIVITAMIN WITH MINERALS) tablet Take 1 tablet by mouth daily.     Yes [provider]  TRESIBA FLEXTOUCH 200 UNIT/ML SOPN Inject 50 Units into the skin daily. 10/28/15  Yes [provider]  CONTOUR NEXT TEST test strip  05/13/19   [provider]   Past Medical History:  Diagnosis Date   Abnormal LFTs    Adenomatous colon polyp 2012   Allergy    to Red Meat   Anemia    Anxiety    Arteriosclerotic cardiovascular disease (ASCVD) 2003   Non-Q MI with stent to RCA in 12/2001, normal EF, additional stents to mid CX and D1; cath in 02/2005-progressive or eye disease, 50-70% mid LAD, 90% distal LAD, 50% D1, 60-70% CX, small PDA with diffuse disease, patent RCA stent; repeat cath in 01/2008-no significant progression; non-Q MI in 02/2010 with 100% mid Cx at  prior stent, nondom. RCA with patent stent, 50% RI, nl EF, CABG-04/2010   Cataract    bilateral cataracts removed   Chronic kidney disease    Creatinine of 1.44 in 04/2009   Chronic obstructive pulmonary disease (HCC)    Coronary artery disease    stent 2011   Diabetes mellitus    Erectile dysfunction    Gastroesophageal reflux disease  Hepatic steatosis    Hyperlipidemia    Predominantly elevated triglycerides   Hypertension    Myocardial infarction (HCC) 2002, 2011   Neuropathy    feet   Sleep apnea    Tobacco abuse, in remission    35 pack years; discontinued 05/2010   Past Surgical History:  Procedure Laterality Date   APPENDECTOMY  1980   CATARACT EXTRACTION W/PHACO Left 09/21/2014   Procedure: CATARACT EXTRACTION PHACO AND INTRAOCULAR LENS PLACEMENT (IOC);  Surgeon: Gemma Payor, MD;  Location: [facility];  Service: Ophthalmology;  Laterality: Left;  CDE:9.77   COLONOSCOPY     COLONOSCOPY W/  POLYPECTOMY  2012   Dr. Juanda Chance; 2 polyps removed one of which was precancerous   CORONARY ARTERY BYPASS GRAFT  05/2010   4 vessels August 2011   EYE SURGERY     HEMORRHOID SURGERY N/A 02/24/2014   Procedure: HEMORRHOIDECTOMY;  Surgeon: Clovis Pu. Cornett, MD;  Location: [facility];  Service: General;  Laterality: N/A;   POLYPECTOMY     SHOULDER ARTHROSCOPY WITH OPEN ROTATOR CUFF REPAIR AND DISTAL CLAVICLE ACROMINECTOMY Left 11/23/2015   Procedure: SHOULDER ARTHROSCOPY WITH MINI-OPEN ROTATOR CUFF REPAIR, DISTAL CLAVICLE RESECTION AND SUBACROMIAL DECOMPRESSION.;  Surgeon: Valeria Batman, MD;  Location: [facility];  Service: Orthopedics;  Laterality: Left;  LEFT SHOULDER ARTHROSCOPIC SUBACROMIAL DECOMPRESSION, DISTAL CLAVICLE RESECTION, MINI-OPEN ROTATOR CUFF REPAIR.   UPPER GASTROINTESTINAL ENDOSCOPY     Family History  Problem Relation Age of Onset   Cirrhosis Mother    Heart disease Father    Breast cancer Sister    Cirrhosis Sister    Pancreatic cancer Brother    Colon cancer Neg Hx    Esophageal cancer Neg Hx    Rectal cancer Neg Hx    Stomach cancer Neg Hx    Colon polyps Neg Hx    Social History   Socioeconomic History   Marital status: Divorced    Spouse name: Not on file   Number of children: Not on file   Years of education: Not on file   Highest education level: Not on file  Occupational History    Employer:   Tobacco Use   Smoking status: Former    Current packs/day: 0.00    Average packs/day: 0.5 packs/day for 39.5 years (19.7 ttl pk-yrs)    Types: Cigarettes    Start date: 02/14/1974    Quit date: 11/08/2016    Years since quitting: 6.5   Smokeless tobacco: Never  Vaping Use   Vaping status: Former   Start date: 08/14/2016  Substance and Sexual Activity   Alcohol use: No    Alcohol/week: 0.0 standard drinks of alcohol   Drug use: No   Sexual activity: Yes    Birth control/protection: None  Other Topics Concern   Not on file  Social  History Narrative   Not on file   Social Determinants of Health   Financial Resource Strain: Not on file  Food Insecurity: Not on file  Transportation Needs: Not on file  Physical Activity: Not on file  Stress: Not on file  Social Connections: Not on file  Intimate Partner Violence: Not on file   ROS-see HPI   + = positive Constitutional:    weight loss, night sweats, fevers, chills, fatigue, lassitude. HEENT:    headaches, difficulty swallowing, tooth/dental problems, sore throat,       sneezing, itching, ear ache, nasal congestion, post nasal drip, snoring CV:    chest pain, orthopnea, PND, swelling  in lower extremities, anasarca,              dizziness, palpitations Resp:   shortness of breath with exertion or at rest.                productive cough,   non-productive cough, coughing up of blood.              change in color of mucus.  wheezing.   Skin:    rash or lesions. GI:  No-   heartburn, indigestion, abdominal pain, nausea, vomiting, diarrhea,                 change in bowel habits, loss of appetite GU: dysuria, change in color of urine, no urgency or frequency.   flank pain. MS:   joint pain, stiffness, decreased range of motion, back pain. Neuro-     nothing unusual Psych:  change in mood or affect.  depression or anxiety.   memory loss.  OBJ- Physical Exam General- Alert, Oriented, Affect-appropriate, Distress- none acute Skin- rash-none, lesions- none, excoriation- none Lymphadenopathy- none Head- atraumatic            Eyes- Gross vision intact, PERRLA, conjunctivae and secretions clear            Ears- Hearing, canals-normal            Nose- Clear, no-Septal dev, mucus, polyps, erosion, perforation             Throat- Mallampati IV , mucosa clear , drainage- none, tonsils- atrophic, +dentures Neck- flexible , trachea midline, no stridor , thyroid nl, carotid no bruit Chest - symmetrical excursion , unlabored           Heart/CV- RRR , no murmur , no gallop  , no  rub, nl s1 s2                           - JVD- none , edema- none, stasis changes- none, varices- none           Lung- clear to P&A, wheeze- none, cough- none , dullness-none, rub- none           Chest wall-  Abd-  Br/ Gen/ Rectal- Not done, not indicated Extrem- cyanosis- none, clubbing, none, atrophy- none, strength- nl Neuro- grossly intact to observation

## 2023-05-17 ENCOUNTER — Telehealth: Payer: Self-pay

## 2023-05-17 NOTE — Telephone Encounter (Signed)
ATC X1 LVM for patient. Need to know where sleep study was done

## 2023-05-17 NOTE — Telephone Encounter (Signed)
Pt called back stating he has done sleep study at Home and doesn't remember the company itself he had the Sleep study done with.

## 2023-05-18 ENCOUNTER — Ambulatory Visit (INDEPENDENT_AMBULATORY_CARE_PROVIDER_SITE_OTHER): Payer: PPO | Admitting: Internal Medicine

## 2023-05-18 ENCOUNTER — Encounter: Payer: Self-pay | Admitting: Internal Medicine

## 2023-05-18 ENCOUNTER — Telehealth: Payer: Self-pay | Admitting: *Deleted

## 2023-05-18 VITALS — BP 126/50 | HR 54 | Temp 97.7°F

## 2023-05-18 DIAGNOSIS — J449 Chronic obstructive pulmonary disease, unspecified: Secondary | ICD-10-CM | POA: Insufficient documentation

## 2023-05-18 DIAGNOSIS — G4733 Obstructive sleep apnea (adult) (pediatric): Secondary | ICD-10-CM | POA: Diagnosis not present

## 2023-05-18 NOTE — Patient Instructions (Signed)
Order- DME [facility]  please reduce autopap range to 4-10, refit mask of choice, continue humidifier, supplies, AirView/ card  Order- referral to sleep center- mask fitting    dx OSA  Please call if we can help

## 2023-05-18 NOTE — Assessment & Plan Note (Signed)
Benefits from CPAP with good compliance and control. Says he sleeps less well with it and has daytime tiredness around noon, because he is awakened frequently by mask issues. Plan- reduce pressure range to 4-10 to reduce leak. Refer for formal mask fitting, since [facility] only had a couple of mask options and didn't really try to fit him. Try caffeine tab occ for tiredness, temporarily

## 2023-05-18 NOTE — Telephone Encounter (Signed)
Called and spoke with a staff member at [facility] and home care.  Adised them that we needed to be tagged in his airview account and need a copy of his HST report faxed to [phone].  She verbalized understanding.  Will await fax.  Access granted to airview.

## 2023-05-18 NOTE — Assessment & Plan Note (Signed)
Status unclear. Significant smoking history but he minimizes symptoms. We can discus this more if that would  be helpful later.

## 2023-05-21 NOTE — Telephone Encounter (Signed)
Order placed to [facility] pharmacy.

## 2023-05-28 DIAGNOSIS — G4733 Obstructive sleep apnea (adult) (pediatric): Secondary | ICD-10-CM | POA: Diagnosis not present

## 2023-05-29 DIAGNOSIS — E782 Mixed hyperlipidemia: Secondary | ICD-10-CM | POA: Diagnosis not present

## 2023-05-29 DIAGNOSIS — Z125 Encounter for screening for malignant neoplasm of prostate: Secondary | ICD-10-CM | POA: Diagnosis not present

## 2023-05-29 DIAGNOSIS — E1142 Type 2 diabetes mellitus with diabetic polyneuropathy: Secondary | ICD-10-CM | POA: Diagnosis not present

## 2023-05-30 LAB — LAB REPORT - SCANNED
A1c: 6.2
Albumin, Urine POC: 50.4
Creatinine, POC: 30.9 mg/dL
EGFR: 35
Microalb Creat Ratio: 163

## 2023-06-01 DIAGNOSIS — I10 Essential (primary) hypertension: Secondary | ICD-10-CM | POA: Diagnosis not present

## 2023-06-01 DIAGNOSIS — E1142 Type 2 diabetes mellitus with diabetic polyneuropathy: Secondary | ICD-10-CM | POA: Diagnosis not present

## 2023-06-01 DIAGNOSIS — I130 Hypertensive heart and chronic kidney disease with heart failure and stage 1 through stage 4 chronic kidney disease, or unspecified chronic kidney disease: Secondary | ICD-10-CM | POA: Diagnosis not present

## 2023-06-01 DIAGNOSIS — D6949 Other primary thrombocytopenia: Secondary | ICD-10-CM | POA: Diagnosis not present

## 2023-06-01 DIAGNOSIS — D509 Iron deficiency anemia, unspecified: Secondary | ICD-10-CM | POA: Diagnosis not present

## 2023-06-01 DIAGNOSIS — R809 Proteinuria, unspecified: Secondary | ICD-10-CM | POA: Diagnosis not present

## 2023-06-01 DIAGNOSIS — N1832 Chronic kidney disease, stage 3b: Secondary | ICD-10-CM | POA: Diagnosis not present

## 2023-06-01 DIAGNOSIS — F5105 Insomnia due to other mental disorder: Secondary | ICD-10-CM | POA: Diagnosis not present

## 2023-06-01 DIAGNOSIS — E782 Mixed hyperlipidemia: Secondary | ICD-10-CM | POA: Diagnosis not present

## 2023-06-01 DIAGNOSIS — E1122 Type 2 diabetes mellitus with diabetic chronic kidney disease: Secondary | ICD-10-CM | POA: Diagnosis not present

## 2023-06-01 DIAGNOSIS — I2510 Atherosclerotic heart disease of native coronary artery without angina pectoris: Secondary | ICD-10-CM | POA: Diagnosis not present

## 2023-06-01 DIAGNOSIS — I5032 Chronic diastolic (congestive) heart failure: Secondary | ICD-10-CM | POA: Diagnosis not present

## 2023-06-06 ENCOUNTER — Ambulatory Visit (HOSPITAL_BASED_OUTPATIENT_CLINIC_OR_DEPARTMENT_OTHER): Payer: PPO | Attending: Internal Medicine

## 2023-06-06 DIAGNOSIS — G4733 Obstructive sleep apnea (adult) (pediatric): Secondary | ICD-10-CM

## 2023-06-21 DIAGNOSIS — N189 Chronic kidney disease, unspecified: Secondary | ICD-10-CM | POA: Diagnosis not present

## 2023-06-21 DIAGNOSIS — E1122 Type 2 diabetes mellitus with diabetic chronic kidney disease: Secondary | ICD-10-CM | POA: Diagnosis not present

## 2023-06-21 DIAGNOSIS — I5032 Chronic diastolic (congestive) heart failure: Secondary | ICD-10-CM | POA: Diagnosis not present

## 2023-06-21 DIAGNOSIS — D638 Anemia in other chronic diseases classified elsewhere: Secondary | ICD-10-CM | POA: Diagnosis not present

## 2023-06-21 DIAGNOSIS — R808 Other proteinuria: Secondary | ICD-10-CM | POA: Diagnosis not present

## 2023-06-27 DIAGNOSIS — D638 Anemia in other chronic diseases classified elsewhere: Secondary | ICD-10-CM | POA: Diagnosis not present

## 2023-06-27 DIAGNOSIS — I129 Hypertensive chronic kidney disease with stage 1 through stage 4 chronic kidney disease, or unspecified chronic kidney disease: Secondary | ICD-10-CM | POA: Diagnosis not present

## 2023-06-27 DIAGNOSIS — E1122 Type 2 diabetes mellitus with diabetic chronic kidney disease: Secondary | ICD-10-CM | POA: Diagnosis not present

## 2023-06-27 DIAGNOSIS — R808 Other proteinuria: Secondary | ICD-10-CM | POA: Diagnosis not present

## 2023-06-28 DIAGNOSIS — G4733 Obstructive sleep apnea (adult) (pediatric): Secondary | ICD-10-CM | POA: Diagnosis not present

## 2023-07-17 NOTE — Progress Notes (Unsigned)
Cardiology Office Note:   Date:  07/18/2023  ID:  [name], DOB [birth year], MRN [id number] PCP: Benita Stabile, MD  [facility] Providers Cardiologist:  Prentice Docker, MD (Inactive) {  History of Present Illness:   [name] is a 67 y.o. male  who presents for follow up of CAD.   He had bypass in 2011.  Since I last saw him he has been diagnosed with sleep apnea.  He has been getting CPAP as she feels more tired with this than he did before.  He is not having any cardiac complaints other than the fatigue.  He is not having any presyncope or syncope.  He denies chest pressure, neck or arm discomfort.  He is busy around his house.  He has chickens.  ROS: As stated in the HPI and negative for all other systems.  Studies Reviewed:    EKG:   EKG Interpretation Date/Time:  Wednesday July 18 2023 09:51:37 EDT Ventricular Rate:  54 PR Interval:  488 QRS Duration:  106 QT Interval:  434 QTC Calculation: 411 R Axis:   76  Text Interpretation: Sinus bradycardia with 1st degree A-V block Otherwise normal ECG When compared with ECG of 16-Nov-2015 08:52, Vent. rate has decreased BY  34 BPM first degree block is new since previous.  Confirmed by Rollene Rotunda ([id number]) on 07/18/2023 10:17:19 AM     Risk Assessment/Calculations:              Physical Exam:   VS:  BP 130/60   Pulse (!) 54   Ht 5\' 5"  (1.651 m)   Wt 187 lb (84.8 kg)   BMI 31.12 kg/m    Wt Readings from Last 3 Encounters:  07/18/23 187 lb (84.8 kg)  04/13/23 194 lb 9.6 oz (88.3 kg)  03/27/23 194 lb 9.6 oz (88.3 kg)     GEN: Well nourished, well developed in no acute distress NECK: No JVD; No carotid bruits CARDIAC: RRR, 2 out of 6 apical systolic murmur radiating slightly at the outflow tract, no diastolic murmurs, rubs, gallops RESPIRATORY:  Clear to auscultation without rales, wheezing or rhonchi  ABDOMEN: Soft, non-tender, non-distended EXTREMITIES:  No edema; No deformity    ASSESSMENT AND PLAN:   CAD The patient has no new sypmtoms.  No further cardiovascular testing is indicated.  We will continue with aggressive risk reduction and meds as listed.  HTN The blood pressure is at target.  No change in therapy.   Hyperlipidemia    LDL was 41 with an HDL of 36.  This was in August.  No change in therapy.   DM His A1c most recently was 6.2 which is down from 6.4.   MURMUR:  There were no abnormalities on echo in 2022.  No further imaging.  CKD IIIB Creat is 2.01 and unchanged from previous.  He is followed by nephrology.  First-degree heart block: This is new.  I will check a 3-day ZIO.  Because of this and fatigue I will also check a TSH.  We talked at length about presenting for evaluation acutely if he has any syncope in the future.       Follow up with me in six months.   Signed, Rollene Rotunda, MD

## 2023-07-18 ENCOUNTER — Ambulatory Visit: Payer: PPO | Admitting: Cardiology

## 2023-07-18 ENCOUNTER — Encounter: Payer: Self-pay | Admitting: Cardiology

## 2023-07-18 ENCOUNTER — Other Ambulatory Visit: Payer: Self-pay | Admitting: *Deleted

## 2023-07-18 ENCOUNTER — Ambulatory Visit: Payer: PPO | Attending: Cardiology

## 2023-07-18 VITALS — BP 130/60 | HR 54 | Ht 65.0 in | Wt 187.0 lb

## 2023-07-18 DIAGNOSIS — E785 Hyperlipidemia, unspecified: Secondary | ICD-10-CM

## 2023-07-18 DIAGNOSIS — R001 Bradycardia, unspecified: Secondary | ICD-10-CM

## 2023-07-18 DIAGNOSIS — E118 Type 2 diabetes mellitus with unspecified complications: Secondary | ICD-10-CM | POA: Diagnosis not present

## 2023-07-18 DIAGNOSIS — N1832 Chronic kidney disease, stage 3b: Secondary | ICD-10-CM | POA: Diagnosis not present

## 2023-07-18 DIAGNOSIS — I2510 Atherosclerotic heart disease of native coronary artery without angina pectoris: Secondary | ICD-10-CM

## 2023-07-18 DIAGNOSIS — I10 Essential (primary) hypertension: Secondary | ICD-10-CM

## 2023-07-18 NOTE — Patient Instructions (Signed)
Medication Instructions:  The current medical regimen is effective;  continue present plan and medications.  *If you need a refill on your cardiac medications before your next appointment, please call your pharmacy*   Lab Work: Please have blood work at your closest [facility]) If you have labs (blood work) drawn today and your tests are completely normal, you will receive your results only by: MyChart Message (if you have MyChart) OR A paper copy in the mail If you have any lab test that is abnormal or we need to change your treatment, we will call you to review the results.   Testing/Procedures: [name]- Long Term Monitor Instructions  Your physician has requested you wear a ZIO patch monitor for 3 days.  This is a single patch monitor. [employer] supplies one patch monitor per enrollment. Additional stickers are not available. Please do not apply patch if you will be having a Nuclear Stress Test,  Echocardiogram, Cardiac CT, MRI, or Chest Xray during the period you would be wearing the  monitor. The patch cannot be worn during these tests. You cannot remove and re-apply the  ZIO XT patch monitor.  Your ZIO patch monitor will be mailed 3 day USPS to your address on file. It may take 3-5 days  to receive your monitor after you have been enrolled.  Once you have received your monitor, please review the enclosed instructions. Your monitor  has already been registered assigning a specific monitor serial # to you.  Billing and Patient Assistance Program Information  We have supplied [employer] with any of your insurance information on file for billing purposes. [employer] offers a sliding scale Patient Assistance Program for patients that do not have  insurance, or whose insurance does not completely cover the cost of the ZIO monitor.  You must apply for the Patient Assistance Program to qualify for this discounted rate.  To apply, please call [employer] at [phone], select option 4, select  option 2, ask to apply for  Patient Assistance Program. Meredeth Ide will ask your household income, and how many people  are in your household. They will quote your out-of-pocket cost based on that information.  [employer] will also be able to set up a 27-month, interest-free payment plan if needed.  Applying the monitor   Shave hair from upper left chest.  Hold abrader disc by orange tab. Rub abrader in 40 strokes over the upper left chest as  indicated in your monitor instructions.  Clean area with 4 enclosed alcohol pads. Let dry.  Apply patch as indicated in monitor instructions. Patch will be placed under collarbone on left  side of chest with arrow pointing upward.  Rub patch adhesive wings for 2 minutes. Remove white label marked "1". Remove the white  label marked "2". Rub patch adhesive wings for 2 additional minutes.  While looking in a mirror, press and release button in center of patch. A small green light will  flash 3-4 times. This will be your only indicator that the monitor has been turned on.  Do not shower for the first 24 hours. You may shower after the first 24 hours.  Press the button if you feel a symptom. You will hear a small click. Record Date, Time and  Symptom in the Patient Logbook.  When you are ready to remove the patch, follow instructions on the last 2 pages of Patient  Logbook. Stick patch monitor onto the last page of Patient Logbook.  Place Patient Logbook in the blue  and white box. Use locking tab on box and tape box closed  securely. The blue and white box has prepaid postage on it. Please place it in the mailbox as  soon as possible. Your physician should have your test results approximately 7 days after the  monitor has been mailed back to [facility].  Call [facility] Customer Care at [phone] if you have questions regarding  your ZIO XT patch monitor. Call them immediately if you see an orange light blinking on your  monitor.  If your monitor  falls off in less than 4 days, contact our Monitor department at [phone].  If your monitor becomes loose or falls off after 4 days call [employer] at [phone] for  suggestions on securing your monitor    Follow-Up: At [facility], you and your health needs are our priority.  As part of our continuing mission to provide you with exceptional heart care, we have created designated Provider Care Teams.  These Care Teams include your primary Cardiologist (physician) and Advanced Practice Providers (APPs -  Physician Assistants and Nurse Practitioners) who all work together to provide you with the care you need, when you need it.  We recommend signing up for the patient portal called "MyChart".  Sign up information is provided on this After Visit Summary.  MyChart is used to connect with patients for Virtual Visits (Telemedicine).  Patients are able to view lab/test results, encounter notes, upcoming appointments, etc.  Non-urgent messages can be sent to your provider as well.   To learn more about what you can do with MyChart, go to [email or web address].    Your next appointment:   6 month(s)  Provider:   Rollene Rotunda, MD

## 2023-07-18 NOTE — Progress Notes (Unsigned)
Enrolled for [employer] to mail a ZIO XT long term holter monitor to the patients address on file.  

## 2023-07-19 LAB — TSH: TSH: 2.64 u[IU]/mL (ref 0.450–4.500)

## 2023-07-22 DIAGNOSIS — R001 Bradycardia, unspecified: Secondary | ICD-10-CM | POA: Diagnosis not present

## 2023-07-28 DIAGNOSIS — G4733 Obstructive sleep apnea (adult) (pediatric): Secondary | ICD-10-CM | POA: Diagnosis not present

## 2023-08-01 DIAGNOSIS — R001 Bradycardia, unspecified: Secondary | ICD-10-CM | POA: Diagnosis not present

## 2023-08-06 ENCOUNTER — Encounter: Payer: Self-pay | Admitting: Cardiology

## 2023-08-09 DIAGNOSIS — E1142 Type 2 diabetes mellitus with diabetic polyneuropathy: Secondary | ICD-10-CM | POA: Diagnosis not present

## 2023-08-09 DIAGNOSIS — L603 Nail dystrophy: Secondary | ICD-10-CM | POA: Diagnosis not present

## 2023-08-09 DIAGNOSIS — L84 Corns and callosities: Secondary | ICD-10-CM | POA: Diagnosis not present

## 2023-08-09 DIAGNOSIS — E1151 Type 2 diabetes mellitus with diabetic peripheral angiopathy without gangrene: Secondary | ICD-10-CM | POA: Diagnosis not present

## 2023-08-09 DIAGNOSIS — I739 Peripheral vascular disease, unspecified: Secondary | ICD-10-CM | POA: Diagnosis not present

## 2023-08-14 ENCOUNTER — Other Ambulatory Visit: Payer: Self-pay

## 2023-08-14 NOTE — Progress Notes (Signed)
   08/14/2023  Patient ID: [name], male   DOB: May 24, 1956, 67 y.o.   MRN: [id number]    2025 Medication Assistance Renewal Application Summary:  Patient was outreached regarding medication assistance renewal for 2025. Verified address, anticipated insurance for 2025, and income has not changed. Patient remains interested in PAP for 2025 for Kerendia, no other new medications were identified for medication assistance.    Medication Review Findings:  Chauncey Mann - no changes noted  [place]- patient reported he already completed application via electronically He declined PA for Novolog and Evaristo Bury as it is covered by insurance   Medication Assistance Findings:  Medication assistance needs identified: [place]     Additional medication assistance options reviewed with patient as warranted:  No other options identified  Plan: I will route patient assistance letter to pharmacy technician who will coordinate patient assistance program application process for medications listed above.  Pharmacy technician will assist with obtaining all required documents from both patient and provider(s) and submit application(s) once completed.    Thank you for allowing pharmacy to be a part of this patient's care.   Cephus Shelling, PharmD Clinical Pharmacist [facility] Cell: [phone]

## 2023-08-20 NOTE — Progress Notes (Unsigned)
05/18/23- 66yoM former smoker (20 pkyrs) for sleep evaluation courtesy of Dr Dwana Melena with concern of OSA Medical problem list  includes ASCVD, dCCHF, HTN, Allergic Rhinitis, GERD, DM2/ neuropathy,  CKD3b, Abnormal LFTs, Obesity, FEAnemia, Anxiety,  Says he is unaware of any lung disease. No ENT surgery. 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  HSTsleep study-SNAP- 11/03/22-AHI 4% 6.1/hr, AHI 3% 36.3/hr, desat to 83%, body weight 190  lbs CPAP  auto 7-14/ [facility]     AirSense 10 AutoSet Download compliance 97%, AHI 3.2/hr Body weight today- Epworth score- Sleep study was prompted by loud snoring. Slept better before CPAP, saying that mask leaks and adjustments during night are disruptive. Now tired around lunch time. Download reviewed. Significant leak noted. He feels pressure too high.  08/23/23- 66yoM former smoker (20 pkyrs) followed for  mild OSA, complicated by  ASCVD, dCCHF, HTN, Allergic Rhinitis, GERD, DM2/ neuropathy,  CKD3b, Abnormal LFTs, Obesity, FEAnemia, Anxiety, CPAP auto 4-10/ [facility] Download compliance-97%, AHI 2/hr Body weight today-191 lbs Had flu vax. Download reviewed. Doing well and very pleased with current mask, after mask-fitting by Marita [place]. Snoring stopped. Does c/o residual tiredness. Avoiding naps. Feels he sleeps well at night. Discussed naps, trials of extra caffeine, trial of adderall if necessary, safe driving. Takes xanax about 3 nights/ week. Doesn't seem to correlate with daytime tiredness.  ROS-see HPI   + = positive Constitutional:    weight loss, night sweats, fevers, chills, fatigue, lassitude. HEENT:    headaches, difficulty swallowing, tooth/dental problems, sore throat,       sneezing, itching, ear ache, nasal congestion, post nasal drip, snoring CV:    chest pain, orthopnea, PND, swelling in lower extremities, anasarca,              dizziness, palpitations Resp:   shortness of breath with exertion or at rest.                 productive cough,   non-productive cough, coughing up of blood.              change in color of mucus.  wheezing.   Skin:    rash or lesions. GI:  No-   heartburn, indigestion, abdominal pain, nausea, vomiting, diarrhea,                 change in bowel habits, loss of appetite GU: dysuria, change in color of urine, no urgency or frequency.   flank pain. MS:   joint pain, stiffness, decreased range of motion, back pain. Neuro-     nothing unusual Psych:  change in mood or affect.  depression or anxiety.   memory loss.  OBJ- Physical Exam General- Alert, Oriented, Affect-appropriate, Distress- none acute Skin- rash-none, lesions- none, excoriation- none Lymphadenopathy- none Head- atraumatic            Eyes- Gross vision intact, PERRLA, conjunctivae and secretions clear            Ears- Hearing, canals-normal            Nose- Clear, no-Septal dev, mucus, polyps, erosion, perforation             Throat- Mallampati IV , mucosa clear , drainage- none, tonsils- atrophic, +dentures Neck- flexible , trachea midline, no stridor , thyroid nl, carotid no bruit Chest - symmetrical excursion , unlabored           Heart/CV- RRR , no murmur , no gallop  , no rub, nl s1 s2                           -  JVD- none , edema- none, stasis changes- none, varices- none           Lung- clear to P&A, wheeze- none, cough- none , dullness-none, rub- none           Chest wall-  Abd-  Br/ Gen/ Rectal- Not done, not indicated Extrem- cyanosis- none, clubbing, none, atrophy- none, strength- nl Neuro- grossly intact to observation

## 2023-08-23 ENCOUNTER — Encounter: Payer: Self-pay | Admitting: Internal Medicine

## 2023-08-23 ENCOUNTER — Ambulatory Visit: Payer: PPO | Admitting: Internal Medicine

## 2023-08-23 VITALS — BP 122/58 | HR 60 | Ht 65.0 in | Wt 191.0 lb

## 2023-08-23 DIAGNOSIS — F5101 Primary insomnia: Secondary | ICD-10-CM

## 2023-08-23 DIAGNOSIS — G4733 Obstructive sleep apnea (adult) (pediatric): Secondary | ICD-10-CM | POA: Diagnosis not present

## 2023-08-23 DIAGNOSIS — G4710 Hypersomnia, unspecified: Secondary | ICD-10-CM

## 2023-08-23 MED ORDER — AMPHETAMINE-DEXTROAMPHETAMINE 10 MG PO TABS: ORAL_TABLET | ORAL | 0 refills | Status: AC

## 2023-08-23 NOTE — Assessment & Plan Note (Signed)
Taking 1/2-1 Xanax for sleep about 3x/week. Doesn't explain daytime sleepiness.

## 2023-08-23 NOTE — Assessment & Plan Note (Signed)
Plan- occasional short nap, driving safety, supplemental caffeine. Then trial low dose adderall if needed.

## 2023-08-23 NOTE — Assessment & Plan Note (Signed)
Benefits from CPAP Plan- continue auto 4-10

## 2023-08-23 NOTE — Patient Instructions (Addendum)
You are doing well with the CPAP- keep up the good work. We will continue auto 4-10.  For the residual tiredness during the day- Short naps are ok when needed. Try a cup of coffee or an otc caffeine tablet -1/2 or 1- eg NoDoz, Vivarin or store brand.  Script sent to try Adderall 10 mg, once or twice daily, If Needed

## 2023-08-27 ENCOUNTER — Other Ambulatory Visit: Payer: Self-pay | Admitting: Pharmacy Technician

## 2023-08-27 DIAGNOSIS — Z5986 Financial insecurity: Secondary | ICD-10-CM

## 2023-08-27 NOTE — Progress Notes (Signed)
Pharmacy Medication Assistance Program Note    08/27/2023  Patient ID: [name], male   DOB: 1956-08-09, 67 y.o.   MRN: [id number]     08/27/2023  Outreach Medication One  Initial Outreach Date (Medication One) 08/27/2023  Manufacturer Medication One Other Manufacturer/Drug  Other Manufacturer/Drug [employer]  Dose of Other Drug Kerendia 10mg   Type of Assistance Manufacturer Assistance  Date Application Sent to Patient 08/29/2023  Application Items Requested Application;Proof of Income;Other  Date Application Sent to Prescriber 08/29/2023  Name of Prescriber John Z [facility]       Signature  Kristopher Glee [facility]  Office: [phone] Fax: [phone] Email: [email or web address]

## 2023-08-28 DIAGNOSIS — G4733 Obstructive sleep apnea (adult) (pediatric): Secondary | ICD-10-CM | POA: Diagnosis not present

## 2023-08-30 ENCOUNTER — Other Ambulatory Visit: Payer: Self-pay | Admitting: Pharmacy Technician

## 2023-08-30 DIAGNOSIS — Z5986 Financial insecurity: Secondary | ICD-10-CM

## 2023-08-30 NOTE — Progress Notes (Signed)
Pharmacy Medication Assistance Program Note    08/30/2023  Patient ID: BRUNO KIENHOLZ, male   DOB: [birth year], 67 y.o.   MRN: [id number]     08/27/2023 08/30/2023  Outreach Medication One  Initial Outreach Date (Medication One) 08/27/2023   Manufacturer Medication One Other Manufacturer/Drug Other Manufacturer/Drug  Other Manufacturer/Drug Armed forces logistics/support/administrative officer for Chauncey Mann  Dose of Other Drug Kerendia 10mg  10mg   Type of Forensic scientist Assistance  Date Application Sent to Patient 08/29/2023   Application Items Requested Application;Proof of Income;Other   Date Application Sent to Prescriber 08/29/2023   Name of Prescriber Benita Stabile   Date Application Received From Patient  08/29/2023  Application Items Received From Patient  Application;Proof of Income;Other  Date Application Received From Provider  08/29/2023  Date Application Submitted to Manufacturer  08/29/2023  Method Application Sent to Manufacturer  Fax     Submission Outreach (Medication One)    Date Application Received From Patient -- 08/29/2023 08/29/2023  Application Items Received From Patient -- Application; Proof of Income; Other Application; Proof of Income; Other  Date Application Received From Provider -- 08/29/2023 08/29/2023  Date Application Submitted to Manufacturer -- 08/29/2023 08/29/2023  Method Application Sent to Manufacturer -- Fax      Signature   Pattricia Boss, CPhT [place]  Office: [phone] Fax: [phone] Email: [email or web address]

## 2023-09-18 DIAGNOSIS — E1122 Type 2 diabetes mellitus with diabetic chronic kidney disease: Secondary | ICD-10-CM | POA: Diagnosis not present

## 2023-09-18 DIAGNOSIS — E782 Mixed hyperlipidemia: Secondary | ICD-10-CM | POA: Diagnosis not present

## 2023-09-18 DIAGNOSIS — N189 Chronic kidney disease, unspecified: Secondary | ICD-10-CM | POA: Diagnosis not present

## 2023-09-18 DIAGNOSIS — N1832 Chronic kidney disease, stage 3b: Secondary | ICD-10-CM | POA: Diagnosis not present

## 2023-09-18 DIAGNOSIS — R808 Other proteinuria: Secondary | ICD-10-CM | POA: Diagnosis not present

## 2023-09-18 DIAGNOSIS — E1142 Type 2 diabetes mellitus with diabetic polyneuropathy: Secondary | ICD-10-CM | POA: Diagnosis not present

## 2023-09-18 DIAGNOSIS — I5032 Chronic diastolic (congestive) heart failure: Secondary | ICD-10-CM | POA: Diagnosis not present

## 2023-09-18 DIAGNOSIS — D638 Anemia in other chronic diseases classified elsewhere: Secondary | ICD-10-CM | POA: Diagnosis not present

## 2023-09-18 DIAGNOSIS — I129 Hypertensive chronic kidney disease with stage 1 through stage 4 chronic kidney disease, or unspecified chronic kidney disease: Secondary | ICD-10-CM | POA: Diagnosis not present

## 2023-09-19 DIAGNOSIS — Z85828 Personal history of other malignant neoplasm of skin: Secondary | ICD-10-CM | POA: Diagnosis not present

## 2023-09-19 DIAGNOSIS — L821 Other seborrheic keratosis: Secondary | ICD-10-CM | POA: Diagnosis not present

## 2023-09-19 DIAGNOSIS — L814 Other melanin hyperpigmentation: Secondary | ICD-10-CM | POA: Diagnosis not present

## 2023-09-19 DIAGNOSIS — L570 Actinic keratosis: Secondary | ICD-10-CM | POA: Diagnosis not present

## 2023-09-20 LAB — LAB REPORT - SCANNED
A1c: 5.8
Albumin, Urine POC: 40.5
Creatinine, POC: 47.6 mg/dL
EGFR: 31
Microalb Creat Ratio: 85

## 2023-09-22 DIAGNOSIS — E1122 Type 2 diabetes mellitus with diabetic chronic kidney disease: Secondary | ICD-10-CM | POA: Diagnosis not present

## 2023-09-22 DIAGNOSIS — R808 Other proteinuria: Secondary | ICD-10-CM | POA: Diagnosis not present

## 2023-09-22 DIAGNOSIS — G4733 Obstructive sleep apnea (adult) (pediatric): Secondary | ICD-10-CM | POA: Diagnosis not present

## 2023-09-22 DIAGNOSIS — I129 Hypertensive chronic kidney disease with stage 1 through stage 4 chronic kidney disease, or unspecified chronic kidney disease: Secondary | ICD-10-CM | POA: Diagnosis not present

## 2023-09-27 DIAGNOSIS — R809 Proteinuria, unspecified: Secondary | ICD-10-CM | POA: Diagnosis not present

## 2023-09-27 DIAGNOSIS — F419 Anxiety disorder, unspecified: Secondary | ICD-10-CM | POA: Diagnosis not present

## 2023-09-27 DIAGNOSIS — I5032 Chronic diastolic (congestive) heart failure: Secondary | ICD-10-CM | POA: Diagnosis not present

## 2023-09-27 DIAGNOSIS — E1122 Type 2 diabetes mellitus with diabetic chronic kidney disease: Secondary | ICD-10-CM | POA: Diagnosis not present

## 2023-09-27 DIAGNOSIS — E782 Mixed hyperlipidemia: Secondary | ICD-10-CM | POA: Diagnosis not present

## 2023-09-27 DIAGNOSIS — I2510 Atherosclerotic heart disease of native coronary artery without angina pectoris: Secondary | ICD-10-CM | POA: Diagnosis not present

## 2023-09-27 DIAGNOSIS — D6949 Other primary thrombocytopenia: Secondary | ICD-10-CM | POA: Diagnosis not present

## 2023-09-27 DIAGNOSIS — D509 Iron deficiency anemia, unspecified: Secondary | ICD-10-CM | POA: Diagnosis not present

## 2023-09-27 DIAGNOSIS — I130 Hypertensive heart and chronic kidney disease with heart failure and stage 1 through stage 4 chronic kidney disease, or unspecified chronic kidney disease: Secondary | ICD-10-CM | POA: Diagnosis not present

## 2023-09-27 DIAGNOSIS — E1142 Type 2 diabetes mellitus with diabetic polyneuropathy: Secondary | ICD-10-CM | POA: Diagnosis not present

## 2023-09-27 DIAGNOSIS — G4733 Obstructive sleep apnea (adult) (pediatric): Secondary | ICD-10-CM | POA: Diagnosis not present

## 2023-09-27 DIAGNOSIS — N1832 Chronic kidney disease, stage 3b: Secondary | ICD-10-CM | POA: Diagnosis not present

## 2023-09-27 DIAGNOSIS — I10 Essential (primary) hypertension: Secondary | ICD-10-CM | POA: Diagnosis not present

## 2023-10-12 ENCOUNTER — Encounter: Payer: Self-pay | Admitting: Gastroenterology

## 2023-10-19 ENCOUNTER — Telehealth: Payer: Self-pay | Admitting: Pharmacy Technician

## 2023-10-19 DIAGNOSIS — Z5986 Financial insecurity: Secondary | ICD-10-CM

## 2023-10-19 NOTE — Progress Notes (Signed)
 Pharmacy Medication Assistance Program Note    10/19/2023  Patient ID: [name], male   DOB: [birth year], 68 y.o.   MRN: [id number]     08/27/2023 08/30/2023 10/19/2023  Outreach Medication One  Initial Outreach Date (Medication One) 08/27/2023    Manufacturer Medication One Other Manufacturer/Drug Other Manufacturer/Drug   Other Manufacturer/Drug [employer]/support/administrative Officer for [place]   Dose of Other Drug Kerendia 10mg  10mg    Type of Forensic Scientist Assistance   Date Application Sent to Patient 08/29/2023    Application Items Requested Application;Proof of Income;Other    Date Application Sent to Prescriber 08/29/2023    Name of Prescriber Norleen JENEANE Hurst    Date Application Received From Patient  08/29/2023   Application Items Received From Patient  Application;Proof of Income;Other   Date Application Received From Provider  08/29/2023   Date Application Submitted to Manufacturer  08/29/2023   Method Application Sent to Manufacturer  Fax   Patient Assistance Determination   Approved  Approval Start Date   10/10/2023  Approval End Date   10/08/2024  Patient Notification Method   Telephone Call  Telephone Call Outcome   Left Voicemail   Care coordination calls placed to [employer] in regard to Kerendia.   Spoke to Brianna at [facility] in regard to Kerendia. She informs application is APPROVED 10/10/23-10/08/24. Dena informs patient's last fill was 07/26/23 for 90 days supply. She informs [employer] patient assistance foundation reached out to patient on 09/22/24 to inquire if he was ready for next shipment and patient informed he had a supply.  Dena informs she will outreach patient next week to set up his shipment.  She informs patient may also call [employer] at [phone] to request next fill. Unsuccessful outreach to patient. HIPAA complaint v/m left notifying patient of his approval as well as refill process. Left name and number for patient to return call as well.      Signature  Kate Marzette [facility]  Office: [phone] Fax: [phone] Email: [email or web address]

## 2023-10-22 DIAGNOSIS — S76311A Strain of muscle, fascia and tendon of the posterior muscle group at thigh level, right thigh, initial encounter: Secondary | ICD-10-CM | POA: Diagnosis not present

## 2023-10-28 DIAGNOSIS — G4733 Obstructive sleep apnea (adult) (pediatric): Secondary | ICD-10-CM | POA: Diagnosis not present

## 2023-11-08 DIAGNOSIS — L603 Nail dystrophy: Secondary | ICD-10-CM | POA: Diagnosis not present

## 2023-11-08 DIAGNOSIS — L84 Corns and callosities: Secondary | ICD-10-CM | POA: Diagnosis not present

## 2023-11-08 DIAGNOSIS — I739 Peripheral vascular disease, unspecified: Secondary | ICD-10-CM | POA: Diagnosis not present

## 2023-11-08 DIAGNOSIS — E1151 Type 2 diabetes mellitus with diabetic peripheral angiopathy without gangrene: Secondary | ICD-10-CM | POA: Diagnosis not present

## 2023-11-08 DIAGNOSIS — E1142 Type 2 diabetes mellitus with diabetic polyneuropathy: Secondary | ICD-10-CM | POA: Diagnosis not present

## 2023-11-13 ENCOUNTER — Ambulatory Visit (AMBULATORY_SURGERY_CENTER): Payer: PPO

## 2023-11-13 VITALS — Ht 64.0 in | Wt 189.0 lb

## 2023-11-13 DIAGNOSIS — Z860100 Personal history of colon polyps, unspecified: Secondary | ICD-10-CM

## 2023-11-13 NOTE — Progress Notes (Signed)
No egg or soy allergy known to patient  No issues known to pt with past sedation with any surgeries or procedures Patient denies ever being told they had issues or difficulty with intubation  No FH of Malignant Hyperthermia Pt is not on diet pills Pt is not on  home 02  OSA - Uses CPAP  Pt is not on blood thinners  Pt denies issues with constipation  No A fib or A flutter Have any cardiac testing pending-- no  LOA: independent  Prep: Split dose miralax    PV completed with patient. Prep instructions sent via mychart and home address.

## 2023-11-27 ENCOUNTER — Encounter: Payer: Self-pay | Admitting: Gastroenterology

## 2023-11-28 DIAGNOSIS — G4733 Obstructive sleep apnea (adult) (pediatric): Secondary | ICD-10-CM | POA: Diagnosis not present

## 2023-11-29 ENCOUNTER — Encounter: Payer: PPO | Admitting: Gastroenterology

## 2023-12-26 DIAGNOSIS — G4733 Obstructive sleep apnea (adult) (pediatric): Secondary | ICD-10-CM | POA: Diagnosis not present

## 2023-12-30 NOTE — Progress Notes (Unsigned)
  Cardiology Office Note:   Date:  01/02/2024  ID:  [name], DOB 09/27/1956, MRN [id number] PCP: Benita Stabile, MD  [facility] Providers Cardiologist:  Prentice Docker, MD (Inactive) {  History of Present Illness:   [name] is a 68 y.o. male who presents for follow up of CAD. He had bypass in 2011. Since I last saw him he has done okay. The patient denies any new symptoms such as chest discomfort, neck or arm discomfort. There has been no new shortness of breath, PND or orthopnea. There have been no reported palpitations, presyncope or syncope.  On I saw him he was having tiredness and I ordered a monitor because he did have some bradycardia but there were no sustained arrhythmias.  There was no explanation for his fatigue based on an arrhythmia.  He has since had adjustments to his CPAP and says this is adequately treated.  He just feels like he has to take a nap in the afternoon and he has been encouraged to do this by his sleep doctor.  ROS: As stated in the HPI and negative for all other systems.  Studies Reviewed:    EKG:     Risk Assessment/Calculations:              Physical Exam:   VS:  BP 130/60   Pulse (!) 54   Ht 5\' 4"  (1.626 m)   Wt 187 lb (84.8 kg)   BMI 32.10 kg/m    Wt Readings from Last 3 Encounters:  01/02/24 187 lb (84.8 kg)  11/13/23 189 lb (85.7 kg)  08/23/23 191 lb (86.6 kg)     GEN: Well nourished, well developed in no acute distress NECK: No JVD; No carotid bruits CARDIAC: RRR, 2 out of 6 brief apical systolic murmur radiating slightly at the aortic outflow tract, no diastolic murmurs, rubs, gallops RESPIRATORY:  Clear to auscultation without rales, wheezing or rhonchi  ABDOMEN: Soft, non-tender, non-distended EXTREMITIES:  No edema; No deformity   ASSESSMENT AND PLAN:   CAD The patient has no new sypmtoms.  No further cardiovascular testing is indicated.  We will continue with aggressive risk reduction and meds as  listed.  HTN The blood pressure is at target.  No change in therapy.    Hyperlipidemia    LDL was 43.  No change in therapy.   DM His A1c was 5.8.  No change in therapy.  MURMUR:  There were no abnormalities on echo in 2022.     CKD IIIB Creat is 2.23 and followed by nephrology.   First-degree heart block:  He has no significant arrhythmias.  No change in therapy.  Sleep apnea:  He continues with CPAP.  No change in therapy.     Follow up with me in 1 year  Signed, Rollene Rotunda, MD

## 2024-01-02 ENCOUNTER — Encounter: Payer: Self-pay | Admitting: Cardiology

## 2024-01-02 ENCOUNTER — Ambulatory Visit (INDEPENDENT_AMBULATORY_CARE_PROVIDER_SITE_OTHER): Payer: PPO | Admitting: Cardiology

## 2024-01-02 VITALS — BP 130/60 | HR 54 | Ht 64.0 in | Wt 187.0 lb

## 2024-01-02 DIAGNOSIS — N1832 Chronic kidney disease, stage 3b: Secondary | ICD-10-CM

## 2024-01-02 DIAGNOSIS — I2510 Atherosclerotic heart disease of native coronary artery without angina pectoris: Secondary | ICD-10-CM | POA: Diagnosis not present

## 2024-01-02 DIAGNOSIS — E118 Type 2 diabetes mellitus with unspecified complications: Secondary | ICD-10-CM | POA: Diagnosis not present

## 2024-01-02 DIAGNOSIS — I10 Essential (primary) hypertension: Secondary | ICD-10-CM

## 2024-01-02 DIAGNOSIS — E785 Hyperlipidemia, unspecified: Secondary | ICD-10-CM | POA: Diagnosis not present

## 2024-01-02 NOTE — Patient Instructions (Signed)

## 2024-01-03 DIAGNOSIS — R809 Proteinuria, unspecified: Secondary | ICD-10-CM | POA: Diagnosis not present

## 2024-01-03 DIAGNOSIS — D631 Anemia in chronic kidney disease: Secondary | ICD-10-CM | POA: Diagnosis not present

## 2024-01-03 DIAGNOSIS — N189 Chronic kidney disease, unspecified: Secondary | ICD-10-CM | POA: Diagnosis not present

## 2024-01-03 DIAGNOSIS — E559 Vitamin D deficiency, unspecified: Secondary | ICD-10-CM | POA: Diagnosis not present

## 2024-01-03 DIAGNOSIS — E211 Secondary hyperparathyroidism, not elsewhere classified: Secondary | ICD-10-CM | POA: Diagnosis not present

## 2024-01-04 ENCOUNTER — Ambulatory Visit: Payer: PPO | Admitting: Gastroenterology

## 2024-01-04 ENCOUNTER — Encounter: Payer: Self-pay | Admitting: Gastroenterology

## 2024-01-04 VITALS — BP 98/76 | HR 58 | Temp 97.7°F | Resp 17 | Ht 64.0 in | Wt 189.0 lb

## 2024-01-04 DIAGNOSIS — Z860101 Personal history of adenomatous and serrated colon polyps: Secondary | ICD-10-CM | POA: Diagnosis not present

## 2024-01-04 DIAGNOSIS — E119 Type 2 diabetes mellitus without complications: Secondary | ICD-10-CM | POA: Diagnosis not present

## 2024-01-04 DIAGNOSIS — Z860100 Personal history of colon polyps, unspecified: Secondary | ICD-10-CM | POA: Diagnosis not present

## 2024-01-04 DIAGNOSIS — F419 Anxiety disorder, unspecified: Secondary | ICD-10-CM | POA: Diagnosis not present

## 2024-01-04 DIAGNOSIS — Z1211 Encounter for screening for malignant neoplasm of colon: Secondary | ICD-10-CM

## 2024-01-04 DIAGNOSIS — J449 Chronic obstructive pulmonary disease, unspecified: Secondary | ICD-10-CM | POA: Diagnosis not present

## 2024-01-04 DIAGNOSIS — D123 Benign neoplasm of transverse colon: Secondary | ICD-10-CM

## 2024-01-04 DIAGNOSIS — I2510 Atherosclerotic heart disease of native coronary artery without angina pectoris: Secondary | ICD-10-CM | POA: Diagnosis not present

## 2024-01-04 MED ORDER — SODIUM CHLORIDE 0.9 % IV SOLN: 500.0000 mL | Freq: Once | INTRAVENOUS | Status: DC

## 2024-01-04 NOTE — Progress Notes (Signed)
 Called to room to assist during endoscopic procedure.  Patient ID and intended procedure confirmed with present staff. Received instructions for my participation in the procedure from the performing physician.

## 2024-01-04 NOTE — Progress Notes (Signed)
 Vss nad trans to pacu

## 2024-01-04 NOTE — Progress Notes (Signed)
 History and Physical:  This patient presents for endoscopic testing for: Encounter Diagnosis  Name Primary?   Hx of colonic polyps Yes    Surveillance colonoscopy today.  Large SSP and two others on last colonoscopy July 2024 Patient denies chronic abdominal pain, rectal bleeding, constipation or diarrhea.   Patient is otherwise without complaints or active issues today.   Past Medical History: Past Medical History:  Diagnosis Date   Abnormal LFTs    Adenomatous colon polyp 2012   Allergy    to Red Meat   Anemia    Anxiety    Arteriosclerotic cardiovascular disease (ASCVD) 2003   Non-Q MI with stent to RCA in 12/2001, normal EF, additional stents to mid CX and D1; cath in 02/2005-progressive or eye disease, 50-70% mid LAD, 90% distal LAD, 50% D1, 60-70% CX, small PDA with diffuse disease, patent RCA stent; repeat cath in 01/2008-no significant progression; non-Q MI in 02/2010 with 100% mid Cx at  prior stent, nondom. RCA with patent stent, 50% RI, nl EF, CABG-04/2010   Cataract    bilateral cataracts removed   Chronic kidney disease    Creatinine of 1.44 in 04/2009   Chronic obstructive pulmonary disease (HCC)    Coronary artery disease    stent 2011   Diabetes mellitus    Erectile dysfunction    Gastroesophageal reflux disease    Hepatic steatosis    Hyperlipidemia    Predominantly elevated triglycerides   Hypertension    Myocardial infarction (HCC) 2002, 2011   Neuropathy    feet   Sleep apnea    Tobacco abuse, in remission    35 pack years; discontinued 05/2010     Past Surgical History: Past Surgical History:  Procedure Laterality Date   APPENDECTOMY  1980   CATARACT EXTRACTION W/PHACO Left 09/21/2014   Procedure: CATARACT EXTRACTION PHACO AND INTRAOCULAR LENS PLACEMENT (IOC);  Surgeon: Gemma Payor, MD;  Location: [facility];  Service: Ophthalmology;  Laterality: Left;  CDE:9.77   COLONOSCOPY     COLONOSCOPY W/ POLYPECTOMY  2012   Dr. Juanda Chance; 2 polyps removed one of  which was precancerous   CORONARY ARTERY BYPASS GRAFT  05/2010   4 vessels August 2011   EYE SURGERY     HEMORRHOID SURGERY N/A 02/24/2014   Procedure: HEMORRHOIDECTOMY;  Surgeon: Clovis Pu. Cornett, MD;  Location: [facility];  Service: General;  Laterality: N/A;   POLYPECTOMY     SHOULDER ARTHROSCOPY WITH OPEN ROTATOR CUFF REPAIR AND DISTAL CLAVICLE ACROMINECTOMY Left 11/23/2015   Procedure: SHOULDER ARTHROSCOPY WITH MINI-OPEN ROTATOR CUFF REPAIR, DISTAL CLAVICLE RESECTION AND SUBACROMIAL DECOMPRESSION.;  Surgeon: Valeria Batman, MD;  Location: [facility];  Service: Orthopedics;  Laterality: Left;  LEFT SHOULDER ARTHROSCOPIC SUBACROMIAL DECOMPRESSION, DISTAL CLAVICLE RESECTION, MINI-OPEN ROTATOR CUFF REPAIR.   UPPER GASTROINTESTINAL ENDOSCOPY      Allergies: Allergies  Allergen Reactions   Ace Inhibitors Anaphylaxis   Lisinopril Other (See Comments)    Angio -edema    Beef-Derived Drug Products Hives    RED MEAT   Hydrochlorothiazide Other (See Comments)    Caused tongue to swell   Other Hives    Red meat   Pegademase Bovine Hives    RED MEAT   Ranexa [Ranolazine] Rash   Wellbutrin [Bupropion Hcl] Rash    Outpatient Meds: Current Outpatient Medications  Medication Sig Dispense Refill   ALPRAZolam (XANAX) 0.5 MG tablet Take 0.5 mg by mouth 2 (two) times daily as needed.     amLODipine (NORVASC)  10 MG tablet Take 1 tablet (10 mg total) daily by mouth. 90 tablet 3   amphetamine-dextroamphetamine (ADDERALL) 10 MG tablet 1 tab twice daily if needed- stimulant 20 tablet 0   aspirin EC 81 MG tablet Take 81 mg by mouth daily.     atorvastatin (LIPITOR) 40 MG tablet TAKE 1 TABLET BY MOUTH DAILY AT 6 PM. 90 tablet 3   carvedilol (COREG) 12.5 MG tablet Take 12.5 mg by mouth 2 (two) times daily with a meal.     Continuous Glucose Sensor (FREESTYLE LIBRE 3 SENSOR) MISC      famotidine (PEPCID) 20 MG tablet Take 20 mg by mouth daily.     Finerenone (KERENDIA) 10 MG TABS Take  10 mg by mouth daily.     hydrALAZINE (APRESOLINE) 100 MG tablet Take 100 mg by mouth 3 (three) times daily.     insulin aspart (NOVOLOG FLEXPEN) 100 UNIT/ML FlexPen Inject 10-20 Units into the skin 2 (two) times daily.     losartan (COZAAR) 100 MG tablet Take 100 mg by mouth daily.     Multiple Vitamins-Minerals (MULTIVITAMIN WITH MINERALS) tablet Take 1 tablet by mouth daily.       PREVNAR 20 0.5 ML injection      TRESIBA FLEXTOUCH 200 UNIT/ML SOPN Inject 50 Units into the skin daily.  3   dapagliflozin propanediol (FARXIGA) 10 MG TABS tablet Take 10 mg by mouth daily.     FLUZONE HIGH-DOSE 0.5 ML injection  (Patient not taking: Reported on 01/04/2024)     Current Facility-Administered Medications  Medication Dose Route Frequency Provider Last Rate Last Admin   0.9 %  sodium chloride infusion  500 mL Intravenous Continuous Danis, Starr [place] III, MD       0.9 %  sodium chloride infusion  500 mL Intravenous Once Danis, Starr [place] III, MD       EPINEPHrine (EPI-PEN) injection 0.3 mg  0.3 mg Intramuscular Once Remus Loffler, PA-C          ___________________________________________________________________ Objective   Exam:  BP (!) 142/63   Pulse (!) 57   Temp 97.7 F (36.5 C) (Temporal)   Ht 5\' 4"  (1.626 m)   Wt 189 lb (85.7 kg)   SpO2 97%   BMI 32.44 kg/m   CV: regular , S1/S2 Resp: clear to auscultation bilaterally, normal RR and effort noted GI: soft, no tenderness, with active bowel sounds.   Assessment: Encounter Diagnosis  Name Primary?   Hx of colonic polyps Yes     Plan: Colonoscopy   The benefits and risks of the planned procedure(s) were described in detail with the patient or (when appropriate) their health care proxy.  Risks were outlined as including, but not limited to, bleeding, infection, perforation, adverse medication reaction leading to cardiac or pulmonary decompensation, pancreatitis (if ERCP).  The limitation of incomplete mucosal visualization was also  discussed.  No guarantees or warranties were given.  The patient is appropriate for an endoscopic procedure in the ambulatory setting.   - Amada [facility], MD

## 2024-01-04 NOTE — Progress Notes (Signed)
 Pt's states no medical or surgical changes since previsit or office visit.

## 2024-01-04 NOTE — Patient Instructions (Signed)
-  Handout on polyps provided -await pathology results -repeat colonoscopy for surveillance in 1 year recommended  -Continue present medications    YOU HAD AN ENDOSCOPIC PROCEDURE TODAY AT THE [facility]:   Refer to the procedure report that was given to you for any specific questions about what was found during the examination.  If the procedure report does not answer your questions, please call your gastroenterologist to clarify.  If you requested that your care partner not be given the details of your procedure findings, then the procedure report has been included in a sealed envelope for you to review at your convenience later.  YOU SHOULD EXPECT: Some feelings of bloating in the abdomen. Passage of more gas than usual.  Walking can help get rid of the air that was put into your GI tract during the procedure and reduce the bloating. If you had a lower endoscopy (such as a colonoscopy or flexible sigmoidoscopy) you may notice spotting of blood in your stool or on the toilet paper. If you underwent a bowel prep for your procedure, you may not have a normal bowel movement for a few days.  Please Note:  You might notice some irritation and congestion in your nose or some drainage.  This is from the oxygen used during your procedure.  There is no need for concern and it should clear up in a day or so.  SYMPTOMS TO REPORT IMMEDIATELY:  Following lower endoscopy (colonoscopy or flexible sigmoidoscopy):  Excessive amounts of blood in the stool  Significant tenderness or worsening of abdominal pains  Swelling of the abdomen that is new, acute  Fever of 100F or higher  For urgent or emergent issues, a gastroenterologist can be reached at any hour by calling [phone]. Do not use MyChart messaging for urgent concerns.    DIET:  We do recommend a small meal at first, but then you may proceed to your regular diet.  Drink plenty of fluids but you should avoid alcoholic beverages for 24  hours.  ACTIVITY:  You should plan to take it easy for the rest of today and you should NOT DRIVE or use heavy machinery until tomorrow (because of the sedation medicines used during the test).    FOLLOW UP: Our staff will call the number listed on your records the next business day following your procedure.  We will call around 7:15- 8:00 am to check on you and address any questions or concerns that you may have regarding the information given to you following your procedure. If we do not reach you, we will leave a message.     If any biopsies were taken you will be contacted by phone or by letter within the next 1-3 weeks.  Please call us at [phone] if you have not heard about the biopsies in 3 weeks.    SIGNATURES/CONFIDENTIALITY: You and/or your care partner have signed paperwork which will be entered into your electronic medical record.  These signatures attest to the fact that that the information above on your After Visit Summary has been reviewed and is understood.  Full responsibility of the confidentiality of this discharge information lies with you and/or your care-partner.

## 2024-01-04 NOTE — Op Note (Signed)
 [facility] Patient Name: [name] Procedure Date: 01/04/2024 11:48 AM MRN: [id number] Endoscopist: Sherilyn Cooter L. Myrtie Neither , MD, [id number] Age: 68 Referring MD:  Date of Birth: Jun 02, 1956 Gender: Male Account #: [id number] Procedure:                Colonoscopy Indications:              High risk colon polyp surveillance: Personal                            history of colonic polyps                           30 mm transverse colon SSP removed with cold                            piecemeal July 2024. 2 additional smaller SSP on                            that exam as well.                           Diminutive tubular adenoma July 2017                           Subcentimeter SSP May 2012 Medicines:                Monitored Anesthesia Care Procedure:                Pre-Anesthesia Assessment:                           - Prior to the procedure, a History and Physical                            was performed, and patient medications and                            allergies were reviewed. The patient's tolerance of                            previous anesthesia was also reviewed. The risks                            and benefits of the procedure and the sedation                            options and risks were discussed with the patient.                            All questions were answered, and informed consent                            was obtained. Prior Anticoagulants: The patient has  taken no anticoagulant or antiplatelet agents. ASA                            Grade Assessment: III - A patient with severe                            systemic disease. After reviewing the risks and                            benefits, the patient was deemed in satisfactory                            condition to undergo the procedure.                           After obtaining informed consent, the colonoscope                            was passed under direct vision.  Throughout the                            procedure, the patient's blood pressure, pulse, and                            oxygen saturations were monitored continuously. The                            CF [id number] #[id number] was introduced through the anus                            and advanced to the the cecum, identified by                            appendiceal orifice and ileocecal valve. The                            colonoscopy was performed with difficulty due to                            poor bowel prep, a redundant colon and significant                            looping. Successful completion of the procedure was                            aided by using manual pressure, straightening and                            shortening the scope to obtain bowel loop reduction                            and lavage. The patient tolerated the procedure  well. The quality of the bowel preparation was                            initially poor throughout the colon and remained                            fair in some areas (particularly left colon)                            despite extensive lavage of thick liquid stool. The                            ileocecal valve, appendiceal orifice, and rectum                            were photographed. The bowel preparation used was                            Miralax (same as his last exam). Scope In: 12:00:16 PM Scope Out: 12:33:14 PM Scope Withdrawal Time: 0 hours 19 minutes 23 seconds  Total Procedure Duration: 0 hours 32 minutes 58 seconds  Findings:                 The perianal and digital rectal examinations were                            normal.                           Repeat examination of right colon (and other select                            areas) under NBI performed.                           A diminutive polyp was found in the transverse                            colon. The polyp was sessile. The polyp was  removed                            with a cold snare. Resection and retrieval were                            complete.                           A tattoo was seen in the transverse colon. The                            tattoo site appeared normal, with no visible scar                            or residual polyp at that site (examined under WL  and NBI).                           The exam was otherwise without abnormality on                            direct and retroflexion views. Complications:            No immediate complications. Estimated Blood Loss:     Estimated blood loss was minimal. Impression:               - Preparation of the colon was fair.                           - One diminutive polyp in the transverse colon,                            removed with a cold snare. Resected and retrieved.                           - A tattoo was seen in the transverse colon. The                            tattoo site appeared normal.                           - The examination was otherwise normal on direct                            and retroflexion views. Recommendation:           - Patient has a contact number available for                            emergencies. The signs and symptoms of potential                            delayed complications were discussed with the                            patient. Return to normal activities tomorrow.                            Written discharge instructions were provided to the                            patient.                           - Resume previous diet.                           - Continue present medications.                           - Await pathology results.                           -  Repeat colonoscopy in 1 year for surveillance.                            Nulytely prep for next exam (see prep details above) Rasha Ibe L. Myrtie Neither, MD 01/04/2024 12:41:53 PM This report has been signed electronically.

## 2024-01-07 ENCOUNTER — Telehealth: Payer: Self-pay

## 2024-01-07 NOTE — Telephone Encounter (Signed)
 Left message on follow up call.

## 2024-01-08 ENCOUNTER — Encounter: Payer: Self-pay | Admitting: Gastroenterology

## 2024-01-08 LAB — SURGICAL PATHOLOGY

## 2024-01-09 DIAGNOSIS — G4733 Obstructive sleep apnea (adult) (pediatric): Secondary | ICD-10-CM | POA: Diagnosis not present

## 2024-01-09 DIAGNOSIS — N184 Chronic kidney disease, stage 4 (severe): Secondary | ICD-10-CM | POA: Diagnosis not present

## 2024-01-09 DIAGNOSIS — I5032 Chronic diastolic (congestive) heart failure: Secondary | ICD-10-CM | POA: Diagnosis not present

## 2024-01-09 DIAGNOSIS — E1129 Type 2 diabetes mellitus with other diabetic kidney complication: Secondary | ICD-10-CM | POA: Diagnosis not present

## 2024-01-23 DIAGNOSIS — E1142 Type 2 diabetes mellitus with diabetic polyneuropathy: Secondary | ICD-10-CM | POA: Diagnosis not present

## 2024-01-23 DIAGNOSIS — E782 Mixed hyperlipidemia: Secondary | ICD-10-CM | POA: Diagnosis not present

## 2024-01-24 LAB — LAB REPORT - SCANNED
A1c: 5.9
Albumin, Urine POC: 51.5
Creatinine, POC: 52.1 mg/dL
EGFR: 31
Microalb Creat Ratio: 99

## 2024-01-29 DIAGNOSIS — F419 Anxiety disorder, unspecified: Secondary | ICD-10-CM | POA: Diagnosis not present

## 2024-01-29 DIAGNOSIS — D509 Iron deficiency anemia, unspecified: Secondary | ICD-10-CM | POA: Diagnosis not present

## 2024-01-29 DIAGNOSIS — D6949 Other primary thrombocytopenia: Secondary | ICD-10-CM | POA: Diagnosis not present

## 2024-01-29 DIAGNOSIS — G4733 Obstructive sleep apnea (adult) (pediatric): Secondary | ICD-10-CM | POA: Diagnosis not present

## 2024-01-29 DIAGNOSIS — I2510 Atherosclerotic heart disease of native coronary artery without angina pectoris: Secondary | ICD-10-CM | POA: Diagnosis not present

## 2024-01-29 DIAGNOSIS — I5032 Chronic diastolic (congestive) heart failure: Secondary | ICD-10-CM | POA: Diagnosis not present

## 2024-01-29 DIAGNOSIS — E782 Mixed hyperlipidemia: Secondary | ICD-10-CM | POA: Diagnosis not present

## 2024-01-29 DIAGNOSIS — R809 Proteinuria, unspecified: Secondary | ICD-10-CM | POA: Diagnosis not present

## 2024-01-29 DIAGNOSIS — I10 Essential (primary) hypertension: Secondary | ICD-10-CM | POA: Diagnosis not present

## 2024-01-29 DIAGNOSIS — E1142 Type 2 diabetes mellitus with diabetic polyneuropathy: Secondary | ICD-10-CM | POA: Diagnosis not present

## 2024-01-29 DIAGNOSIS — I130 Hypertensive heart and chronic kidney disease with heart failure and stage 1 through stage 4 chronic kidney disease, or unspecified chronic kidney disease: Secondary | ICD-10-CM | POA: Diagnosis not present

## 2024-01-29 DIAGNOSIS — N1832 Chronic kidney disease, stage 3b: Secondary | ICD-10-CM | POA: Diagnosis not present

## 2024-03-13 DIAGNOSIS — H43813 Vitreous degeneration, bilateral: Secondary | ICD-10-CM | POA: Diagnosis not present

## 2024-03-13 DIAGNOSIS — H35373 Puckering of macula, bilateral: Secondary | ICD-10-CM | POA: Diagnosis not present

## 2024-03-13 DIAGNOSIS — D3131 Benign neoplasm of right choroid: Secondary | ICD-10-CM | POA: Diagnosis not present

## 2024-03-13 DIAGNOSIS — E113293 Type 2 diabetes mellitus with mild nonproliferative diabetic retinopathy without macular edema, bilateral: Secondary | ICD-10-CM | POA: Diagnosis not present

## 2024-03-13 DIAGNOSIS — H353131 Nonexudative age-related macular degeneration, bilateral, early dry stage: Secondary | ICD-10-CM | POA: Diagnosis not present

## 2024-03-14 DIAGNOSIS — Z7182 Exercise counseling: Secondary | ICD-10-CM | POA: Diagnosis not present

## 2024-03-14 DIAGNOSIS — R5383 Other fatigue: Secondary | ICD-10-CM | POA: Diagnosis not present

## 2024-03-14 DIAGNOSIS — F419 Anxiety disorder, unspecified: Secondary | ICD-10-CM | POA: Diagnosis not present

## 2024-03-14 DIAGNOSIS — W57XXXS Bitten or stung by nonvenomous insect and other nonvenomous arthropods, sequela: Secondary | ICD-10-CM | POA: Diagnosis not present

## 2024-03-14 DIAGNOSIS — I10 Essential (primary) hypertension: Secondary | ICD-10-CM | POA: Diagnosis not present

## 2024-03-14 DIAGNOSIS — E669 Obesity, unspecified: Secondary | ICD-10-CM | POA: Diagnosis not present

## 2024-03-14 DIAGNOSIS — Z79899 Other long term (current) drug therapy: Secondary | ICD-10-CM | POA: Diagnosis not present

## 2024-03-14 DIAGNOSIS — Z6832 Body mass index (BMI) 32.0-32.9, adult: Secondary | ICD-10-CM | POA: Diagnosis not present

## 2024-03-14 DIAGNOSIS — W57XXXA Bitten or stung by nonvenomous insect and other nonvenomous arthropods, initial encounter: Secondary | ICD-10-CM | POA: Diagnosis not present

## 2024-03-14 DIAGNOSIS — Z713 Dietary counseling and surveillance: Secondary | ICD-10-CM | POA: Diagnosis not present

## 2024-03-28 ENCOUNTER — Ambulatory Visit (INDEPENDENT_AMBULATORY_CARE_PROVIDER_SITE_OTHER): Admitting: Podiatry

## 2024-03-28 DIAGNOSIS — B351 Tinea unguium: Secondary | ICD-10-CM | POA: Diagnosis not present

## 2024-03-28 DIAGNOSIS — E1151 Type 2 diabetes mellitus with diabetic peripheral angiopathy without gangrene: Secondary | ICD-10-CM | POA: Diagnosis not present

## 2024-03-28 DIAGNOSIS — M79672 Pain in left foot: Secondary | ICD-10-CM

## 2024-03-28 DIAGNOSIS — I70209 Unspecified atherosclerosis of native arteries of extremities, unspecified extremity: Secondary | ICD-10-CM

## 2024-03-28 DIAGNOSIS — M79671 Pain in right foot: Secondary | ICD-10-CM

## 2024-03-28 NOTE — Progress Notes (Signed)
 Patient presents for evaluation and treatment of tenderness and some redness around nails feet.  Tenderness around toes with walking and wearing shoes.  Physical exam:  General appearance: Alert, pleasant, and in no acute distress.  Vascular: Pedal pulses: DP 2/4 B/L, PT 0/4 B/L.  Mild edema lower legs bilaterally  Neurological:    Dermatologic:  Nails thickened, disfigured, discolored 1-5 BL with subungual debris.  Redness and hypertrophic nail folds along nail folds bilaterally but no signs of drainage or infection.  Musculoskeletal:     Diagnosis: 1. Painful onychomycotic nails 1 through 5 bilaterally. 2. Pain toes 1 through 5 bilaterally. 3.  Diabetes mellitus type 2 with PVD  Plan: Debrided onychomycotic nails 1 through 5 bilaterally.  Return 3 months [facility]

## 2024-05-26 DIAGNOSIS — Z125 Encounter for screening for malignant neoplasm of prostate: Secondary | ICD-10-CM | POA: Diagnosis not present

## 2024-05-26 DIAGNOSIS — E782 Mixed hyperlipidemia: Secondary | ICD-10-CM | POA: Diagnosis not present

## 2024-05-26 DIAGNOSIS — E1142 Type 2 diabetes mellitus with diabetic polyneuropathy: Secondary | ICD-10-CM | POA: Diagnosis not present

## 2024-05-30 DIAGNOSIS — F419 Anxiety disorder, unspecified: Secondary | ICD-10-CM | POA: Diagnosis not present

## 2024-05-30 DIAGNOSIS — R809 Proteinuria, unspecified: Secondary | ICD-10-CM | POA: Diagnosis not present

## 2024-05-30 DIAGNOSIS — I2510 Atherosclerotic heart disease of native coronary artery without angina pectoris: Secondary | ICD-10-CM | POA: Diagnosis not present

## 2024-05-30 DIAGNOSIS — E782 Mixed hyperlipidemia: Secondary | ICD-10-CM | POA: Diagnosis not present

## 2024-05-30 DIAGNOSIS — D6949 Other primary thrombocytopenia: Secondary | ICD-10-CM | POA: Diagnosis not present

## 2024-05-30 DIAGNOSIS — N1832 Chronic kidney disease, stage 3b: Secondary | ICD-10-CM | POA: Diagnosis not present

## 2024-05-30 DIAGNOSIS — I5032 Chronic diastolic (congestive) heart failure: Secondary | ICD-10-CM | POA: Diagnosis not present

## 2024-05-30 DIAGNOSIS — E1142 Type 2 diabetes mellitus with diabetic polyneuropathy: Secondary | ICD-10-CM | POA: Diagnosis not present

## 2024-05-30 DIAGNOSIS — E669 Obesity, unspecified: Secondary | ICD-10-CM | POA: Diagnosis not present

## 2024-05-30 DIAGNOSIS — I130 Hypertensive heart and chronic kidney disease with heart failure and stage 1 through stage 4 chronic kidney disease, or unspecified chronic kidney disease: Secondary | ICD-10-CM | POA: Diagnosis not present

## 2024-05-30 DIAGNOSIS — I10 Essential (primary) hypertension: Secondary | ICD-10-CM | POA: Diagnosis not present

## 2024-05-30 DIAGNOSIS — D509 Iron deficiency anemia, unspecified: Secondary | ICD-10-CM | POA: Diagnosis not present

## 2024-06-19 DIAGNOSIS — N189 Chronic kidney disease, unspecified: Secondary | ICD-10-CM | POA: Diagnosis not present

## 2024-06-19 DIAGNOSIS — I10 Essential (primary) hypertension: Secondary | ICD-10-CM | POA: Diagnosis not present

## 2024-06-19 DIAGNOSIS — R809 Proteinuria, unspecified: Secondary | ICD-10-CM | POA: Diagnosis not present

## 2024-06-19 DIAGNOSIS — D631 Anemia in chronic kidney disease: Secondary | ICD-10-CM | POA: Diagnosis not present

## 2024-06-23 DIAGNOSIS — N049 Nephrotic syndrome with unspecified morphologic changes: Secondary | ICD-10-CM | POA: Diagnosis not present

## 2024-06-23 DIAGNOSIS — E1129 Type 2 diabetes mellitus with other diabetic kidney complication: Secondary | ICD-10-CM | POA: Diagnosis not present

## 2024-06-23 DIAGNOSIS — G4733 Obstructive sleep apnea (adult) (pediatric): Secondary | ICD-10-CM | POA: Diagnosis not present

## 2024-06-23 DIAGNOSIS — N1832 Chronic kidney disease, stage 3b: Secondary | ICD-10-CM | POA: Diagnosis not present

## 2024-07-01 ENCOUNTER — Ambulatory Visit (INDEPENDENT_AMBULATORY_CARE_PROVIDER_SITE_OTHER): Admitting: Podiatry

## 2024-07-01 ENCOUNTER — Encounter: Payer: Self-pay | Admitting: Podiatry

## 2024-07-01 DIAGNOSIS — M79672 Pain in left foot: Secondary | ICD-10-CM

## 2024-07-01 DIAGNOSIS — M79671 Pain in right foot: Secondary | ICD-10-CM

## 2024-07-01 DIAGNOSIS — B351 Tinea unguium: Secondary | ICD-10-CM

## 2024-07-01 NOTE — Progress Notes (Signed)
 Patient presents for evaluation and treatment of tenderness and some redness around nails feet.  Tenderness around toes with walking and wearing shoes.  Physical exam:  General appearance: Alert, pleasant, and in no acute distress.  Vascular: Pedal pulses: DP 2/4 B/L, PT 0/4 B/L. Mild edema lower legs bilaterally  Neu  Dermatologic:  Nails thickened, disfigured, discolored 1-5 BL with subungual debris.  Redness and hypertrophic nail folds along nail folds bilaterally but no signs of drainage or infection.  Musculoskeletal:     Diagnosis: 1. Painful onychomycotic nails 1 through 5 bilaterally. 2. Pain toes 1 through 5 bilaterally.  Plan: -Debrided onychomycotic nails 1 through 5 bilaterally.  Sharply debrided nails with nail clipper and reduced with a power bur.  Return 3 months [facility]

## 2024-08-22 ENCOUNTER — Ambulatory Visit: Payer: PPO | Admitting: Internal Medicine

## 2024-08-26 ENCOUNTER — Ambulatory Visit: Admitting: Internal Medicine

## 2024-09-09 ENCOUNTER — Encounter

## 2024-09-09 DIAGNOSIS — Z85828 Personal history of other malignant neoplasm of skin: Secondary | ICD-10-CM | POA: Diagnosis not present

## 2024-09-09 DIAGNOSIS — L570 Actinic keratosis: Secondary | ICD-10-CM | POA: Diagnosis not present

## 2024-09-09 DIAGNOSIS — D692 Other nonthrombocytopenic purpura: Secondary | ICD-10-CM | POA: Diagnosis not present

## 2024-09-09 DIAGNOSIS — L821 Other seborrheic keratosis: Secondary | ICD-10-CM | POA: Diagnosis not present

## 2024-09-11 DIAGNOSIS — E782 Mixed hyperlipidemia: Secondary | ICD-10-CM | POA: Diagnosis not present

## 2024-09-12 LAB — LAB REPORT - SCANNED
A1c: 5.8
Albumin, Urine POC: 40.5
Creatinine, POC: 49.5 mg/dL
EGFR: 34
Microalb Creat Ratio: 82

## 2024-09-30 ENCOUNTER — Ambulatory Visit: Admitting: Podiatry

## 2024-09-30 ENCOUNTER — Encounter: Payer: Self-pay | Admitting: Podiatry

## 2024-09-30 DIAGNOSIS — B351 Tinea unguium: Secondary | ICD-10-CM

## 2024-09-30 DIAGNOSIS — M79672 Pain in left foot: Secondary | ICD-10-CM

## 2024-09-30 DIAGNOSIS — M79671 Pain in right foot: Secondary | ICD-10-CM | POA: Diagnosis not present

## 2024-09-30 NOTE — Progress Notes (Signed)
 Patient presents for evaluation and treatment of tenderness and some redness around nails feet.  Tenderness around toes with walking and wearing shoes.  Physical exam:  General appearance: Alert, pleasant, and in no acute distress.  Vascular: Pedal pulses: DP 2/4 B/L, PT 0/4 B/L. Mild edema lower legs bilaterally.  Capillary refill time immediate bilaterally  Neurologic:  Dermatologic:  Nails thickened, disfigured, discolored 1-5 BL with subungual debris.  Redness and hypertrophic nail folds along nail folds bilaterally but no signs of drainage or infection.  Musculoskeletal:     Diagnosis: 1. Painful onychomycotic nails 1 through 5 bilaterally. 2. Pain toes 1 through 5 bilaterally.  Plan: -Debrided onychomycotic nails 1 through 5 bilaterally.  Sharply debrided nails with nail clipper and reduced with a power bur.  Return 3 months [facility]

## 2024-12-29 ENCOUNTER — Ambulatory Visit: Admitting: Podiatry

## 2025-01-14 ENCOUNTER — Ambulatory Visit: Admitting: Cardiology
